# Patient Record
Sex: Male | Born: 1975 | Race: Black or African American | Hispanic: No | Marital: Married | Smoking: Never smoker
Health system: Southern US, Community
[De-identification: ages and names within clinical notes are randomized; demographics above are authoritative.]

## PROBLEM LIST (undated history)

## (undated) DIAGNOSIS — I10 Essential (primary) hypertension: Secondary | ICD-10-CM

## (undated) DIAGNOSIS — K859 Acute pancreatitis without necrosis or infection, unspecified: Secondary | ICD-10-CM

## (undated) DIAGNOSIS — E785 Hyperlipidemia, unspecified: Secondary | ICD-10-CM

## (undated) DIAGNOSIS — M1990 Unspecified osteoarthritis, unspecified site: Secondary | ICD-10-CM

## (undated) DIAGNOSIS — T7840XA Allergy, unspecified, initial encounter: Secondary | ICD-10-CM

## (undated) DIAGNOSIS — K219 Gastro-esophageal reflux disease without esophagitis: Secondary | ICD-10-CM

## (undated) DIAGNOSIS — G629 Polyneuropathy, unspecified: Secondary | ICD-10-CM

## (undated) DIAGNOSIS — F32A Depression, unspecified: Secondary | ICD-10-CM

## (undated) DIAGNOSIS — E1165 Type 2 diabetes mellitus with hyperglycemia: Principal | ICD-10-CM

## (undated) DIAGNOSIS — R030 Elevated blood-pressure reading, without diagnosis of hypertension: Secondary | ICD-10-CM

## (undated) DIAGNOSIS — F419 Anxiety disorder, unspecified: Secondary | ICD-10-CM

## (undated) DIAGNOSIS — G4730 Sleep apnea, unspecified: Secondary | ICD-10-CM

## (undated) HISTORY — DX: Type 2 diabetes mellitus with hyperglycemia: E11.65

## (undated) HISTORY — DX: Polyneuropathy, unspecified: G62.9

## (undated) HISTORY — DX: Allergy, unspecified, initial encounter: T78.40XA

## (undated) HISTORY — DX: Depression, unspecified: F32.A

## (undated) HISTORY — DX: Unspecified osteoarthritis, unspecified site: M19.90

## (undated) HISTORY — PX: WISDOM TOOTH EXTRACTION: SHX21

## (undated) HISTORY — DX: Anxiety disorder, unspecified: F41.9

## (undated) HISTORY — DX: Hyperlipidemia, unspecified: E78.5

## (undated) HISTORY — DX: Gastro-esophageal reflux disease without esophagitis: K21.9

## (undated) HISTORY — DX: Elevated blood-pressure reading, without diagnosis of hypertension: R03.0

---

## 2002-09-01 ENCOUNTER — Encounter: Payer: Self-pay | Admitting: Emergency Medicine

## 2002-09-01 ENCOUNTER — Emergency Department (HOSPITAL_COMMUNITY): Admission: EM | Admit: 2002-09-01 | Discharge: 2002-09-02 | Payer: Self-pay | Admitting: Emergency Medicine

## 2009-12-31 ENCOUNTER — Ambulatory Visit: Payer: Self-pay | Admitting: Family Medicine

## 2009-12-31 DIAGNOSIS — L910 Hypertrophic scar: Secondary | ICD-10-CM | POA: Insufficient documentation

## 2009-12-31 DIAGNOSIS — R030 Elevated blood-pressure reading, without diagnosis of hypertension: Secondary | ICD-10-CM

## 2009-12-31 DIAGNOSIS — L03221 Cellulitis of neck: Secondary | ICD-10-CM

## 2009-12-31 DIAGNOSIS — R631 Polydipsia: Secondary | ICD-10-CM

## 2009-12-31 DIAGNOSIS — L0211 Cutaneous abscess of neck: Secondary | ICD-10-CM

## 2009-12-31 HISTORY — DX: Elevated blood-pressure reading, without diagnosis of hypertension: R03.0

## 2010-01-02 ENCOUNTER — Ambulatory Visit: Payer: Self-pay | Admitting: Family Medicine

## 2010-01-02 ENCOUNTER — Encounter: Payer: Self-pay | Admitting: Family Medicine

## 2010-01-02 DIAGNOSIS — E1165 Type 2 diabetes mellitus with hyperglycemia: Secondary | ICD-10-CM

## 2010-01-02 DIAGNOSIS — IMO0001 Reserved for inherently not codable concepts without codable children: Secondary | ICD-10-CM

## 2010-01-02 HISTORY — DX: Reserved for inherently not codable concepts without codable children: IMO0001

## 2010-01-03 LAB — CONVERTED CEMR LAB
BUN: 14 mg/dL (ref 6–23)
Chloride: 100 meq/L (ref 96–112)
Creatinine,U: 156.4 mg/dL
HDL: 27.7 mg/dL — ABNORMAL LOW (ref >39.00)
Hgb A1c MFr Bld: 13 % — ABNORMAL HIGH (ref 4.6–6.5)
LDL Cholesterol: 149 mg/dL — ABNORMAL HIGH (ref 0–99)
Microalb Creat Ratio: 0.7 mg/g (ref 0.0–30.0)
Microalb, Ur: 1.1 mg/dL (ref 0.0–1.9)
Potassium: 4.9 meq/L (ref 3.5–5.1)
Triglycerides: 82 mg/dL (ref 0.0–149.0)
VLDL: 16.4 mg/dL (ref 0.0–40.0)

## 2010-02-04 ENCOUNTER — Ambulatory Visit
Admission: RE | Admit: 2010-02-04 | Discharge: 2010-02-04 | Payer: Self-pay | Source: Home / Self Care | Attending: Family Medicine | Admitting: Family Medicine

## 2010-02-21 NOTE — Assessment & Plan Note (Signed)
Summary: 2 day pt will come in fasting/njr   Vital Signs:  Patient profile:   35 year old male Temp:     98.6 degrees F oral BP sitting:   130 / 78  (left arm) Cuff size:   large  Vitals Entered By: Sid Falcon LPN (January 02, 2010 10:56 AM) CBG Result 278   History of Present Illness: Patient for followup. Abscess posterior neck. Feels much better overall after recent incision. Draining copious amounts of pus and much less tender. No fever or chills. No headaches.  Patient had nonfasting blood sugar 285 2 days ago. Very strong family history type 2 diabetes in father and several grandparents. Has had some polydipsia. No prior.diagnosis of diabetes.  Poor diet compliance and no regular exercise.  Currently on no meds.  Allergies (verified): No Known Drug Allergies  Past History:  Family History: Last updated: 01/02/2010 Family History Diabetes 1st degree relative  Father  Social History: Last updated: 12/31/2009 Never Smoked Alcohol use-no Regular exercise-no  Risk Factors: Exercise: no (12/31/2009)  Risk Factors: Smoking Status: never (12/31/2009)  Past Medical History: elevated blood pressure keloids Type 2 diabetes 12/11 PMH-FH-SH reviewed for relevance  Family History: Family History Diabetes 1st degree relative  Father  Review of Systems  The patient denies anorexia, fever, weight loss, chest pain, and headaches.    Physical Exam  General:  Well-developed,well-nourished,in no acute distress; alert,appropriate and cooperative throughout examination Head:  Normocephalic and atraumatic without obvious abnormalities. No apparent alopecia or balding. Mouth:  Oral mucosa and oropharynx without lesions or exudates.  Teeth in good repair. Neck:  large keloid mass posterior neck.  still has some purulent drainage which is expressed with some pressure. This is draining well. Much less tender to palpation also less erythema Lungs:  Normal respiratory effort,  chest expands symmetrically. Lungs are clear to auscultation, no crackles or wheezes. Heart:  normal rate and regular rhythm.   Extremities:  no pitting edema.   Impression & Recommendations:  Problem # 1:  ABSCESS, NECK (ICD-682.1) Assessment Improved cont antibiotic and warm compresses His updated medication list for this problem includes:    Amoxicillin-pot Clavulanate 875-125 Mg Tabs (Amoxicillin-pot clavulanate) ..... One by mouth two times a day with food for 10 days  Problem # 2:  DIABETES MELLITUS, TYPE II, UNCONTROLLED (ICD-250.02) Assessment: New  long talk with patient. Start metformin 500 mg b.i.d. Discussed dietary measures and exercise. Reassess in one month. Obtain baseline lipids and A1c.  This is new onset Type 2 DM.  Orders: Specimen Handling (16109) Venipuncture (60454) TLB-Lipid Panel (80061-LIPID) TLB-A1C / Hgb A1C (Glycohemoglobin) (83036-A1C) TLB-BMP (Basic Metabolic Panel-BMET) (80048-METABOL) TLB-Microalbumin/Creat Ratio, Urine (82043-MALB)  His updated medication list for this problem includes:    Metformin Hcl 500 Mg Tabs (Metformin hcl) ..... One by mouth two times a day  Complete Medication List: 1)  Amoxicillin-pot Clavulanate 875-125 Mg Tabs (Amoxicillin-pot clavulanate) .... One by mouth two times a day with food for 10 days 2)  Metformin Hcl 500 Mg Tabs (Metformin hcl) .... One by mouth two times a day 3)  Onetouch Ultra 2 W/device Kit (Blood glucose monitoring suppl) .... Disp kit today 12/14 4)  Onetouch Ultrasoft Lancets Misc (Lancets) .... Test bs 3-4 times per week 5)  Onetouch Ultra Blue Strp (Glucose blood) .... Test bo 3-4 times per week  Other Orders: Capillary Blood Glucose/CBG (09811)  Patient Instructions: 1)  Please schedule a follow-up appointment in 1 month.  2)  It is important that  you exercise reguarly at least 20 minutes 5 times a week. If you develop chest pain, have severe difficulty breathing, or feel very tired, stop  exercising immediately and seek medical attention.  3)  You need to lose weight. Consider a lower calorie diet and regular exercise.  Prescriptions: ONETOUCH ULTRA BLUE  STRP (GLUCOSE BLOOD) test BO 3-4 times per week  #100 x 3   Entered by:   Sid Falcon LPN   Authorized by:   Evelena Peat MD   Signed by:   Sid Falcon LPN on 16/10/9602   Method used:   Electronically to        [facility] #[id number]* (retail)       [address]       [place], [place]  [address]       Ph: [phone]       Fax: [phone]   RxID:   [id number] Biagio [employer] LANCETS  MISC (LANCETS) test BS 3-4 times per week  #100 x 3   Entered by:   Sid Falcon LPN   Authorized by:   Evelena Peat MD   Signed by:   Sid Falcon LPN on 53/66/4403   Method used:   Electronically to        [facility] #[id number]* (retail)       [address]       [place], [place]  [address]       Ph: [phone]       Fax: [phone]   RxID:   [id number] METFORMIN HCL 500 MG TABS (METFORMIN HCL) one by mouth two times a day  #60 x 3   Entered and Authorized by:   Evelena Peat MD   Signed by:   Evelena Peat MD on 01/02/2010   Method used:   Electronically to        [facility] #[id number]* (retail)       [address]       [place], [place]  [address]       Ph: [phone]       Fax: [phone]   RxID:   [id number]    Orders Added: 1)  Capillary Blood Glucose/CBG [82948] 2)  Specimen Handling [99000] 3)  Venipuncture [18299] 4)  TLB-Lipid Panel [80061-LIPID] 5)  TLB-A1C / Hgb A1C (Glycohemoglobin) [83036-A1C] 6)  TLB-BMP (Basic Metabolic Panel-BMET) [80048-METABOL] 7)  TLB-Microalbumin/Creat Ratio, Urine [82043-MALB] 8)  Est. Patient Level III [37169]

## 2010-02-21 NOTE — Assessment & Plan Note (Signed)
Summary: New Acute/fever/chills/runny nose/inf hair follical/cjr   Vital Signs:  Patient profile:   35 year old male Height:      72.75 inches Weight:      387 pounds Temp:     98.9 degrees F oral Pulse rate:   80 / minute Pulse rhythm:   regular Resp:     12 per minute BP sitting:   145 / 80  (left arm) Cuff size:   large  Vitals Entered By: Sid Falcon LPN (December 31, 2009 3:49 PM)  History of Present Illness: new patient to reestablish care. Not seen here over 3 years.  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 Has noticed some redness and soreness of the area for couple days. Also relates possible low-grade fever, chills and rhinorrhea past couple days.  Started spontaneously draining pus couple of days ago.  History elevated blood pressure never treated for hypertension. Reports whitecoat syndrome. No other chronic problems reported. Some increased thirst no polyuria. No history of diabetes.  Patient is nonsmoker. Occasional alcohol use.  Preventive Screening-Counseling & Management  Alcohol-Tobacco     Smoking Status: never  Caffeine-Diet-Exercise     Does Patient Exercise: no  Allergies (verified): No Known Drug Allergies  Past History:  Past Medical History: elevated blood pressure keloids PMH reviewed for relevance  Social History: Never Smoked Alcohol use-no Regular exercise-no Smoking Status:  never Does Patient Exercise:  no  Review of Systems  The patient denies anorexia, fever, weight loss, chest pain, syncope, dyspnea on exertion, peripheral edema, prolonged cough, headaches, hemoptysis, abdominal pain, melena, hematochezia, severe indigestion/heartburn, depression, and enlarged lymph nodes.    Physical Exam  General:  Well-developed,well-nourished,in no acute distress; alert,appropriate and cooperative throughout examination Head:  patient  has fairly extensive keloid formation posterior neck. Left lower neck region reveals some purulent drainage from one area. Minimal fluctuance. Moderate overlying erythema Ears:  External ear exam shows no significant lesions or deformities.  Otoscopic examination reveals clear canals, tympanic membranes are intact bilaterally without bulging, retraction, inflammation or discharge. Hearing is grossly normal bilaterally. Mouth:  Oral mucosa and oropharynx without lesions or exudates.  Teeth in good repair. Neck:  No deformities, masses, or tenderness noted. See skin exam. Lungs:  Normal respiratory effort, chest expands symmetrically. Lungs are clear to auscultation, no crackles or wheezes. Heart:  normal rate and regular rhythm.   Abdomen:  soft and non-tender.   Neurologic:  alert & oriented X3 and cranial nerves II-XII intact.   Skin:  large keloidal mass post neck.  Near Inf portion just R of midline he has an area about 3 by 4 cm of erythema and warmth and one small punctate area of purulent drainage.  minimal fluctuance. Cervical Nodes:  No lymphadenopathy noted   Impression & Recommendations:  Problem # 1:  ABSCESS, NECK (ICD-682.1) Assessment New Discussed risks and benefits of I and D and pt consented.  Prepped post neck with betadine and anest with 1 % plain xylociane. Using 11 blade 1 cm horizontal incsion made.  Probed abscessed area with hemostats and very little additional pus expressed. Start Augmentin and reassess in 2 days. Orders: T-Culture, Wound (87070/[id number]) I&D Abscess, Complex (10061)  His updated medication list for this problem includes:    Amoxicillin-pot Clavulanate 875-125 Mg Tabs (Amoxicillin-pot clavulanate) ..... One by mouth two times a day with food for 10 days  Problem # 2:  POLYDIPSIA (ICD-783.5)  Assessment: New  check blood sugar to rule out diabetes. Nonfasting (3 hr pp) 285.  Pt will return 2 days for fasting CBG and will very likely need to  start meds.  Orders: Glucose, (CBG) (81191)  Problem # 3:  ELEVATED BLOOD PRESSURE (ICD-796.2) Assessment: New reassess at follow up.  Problem # 4:  KELOID SCAR (ICD-701.4)  Complete Medication List: 1)  Amoxicillin-pot Clavulanate 875-125 Mg Tabs (Amoxicillin-pot clavulanate) .... One by mouth two times a day with food for 10 days  Patient Instructions: 1)  Schedule followup in 2 days to reassess 2)  warm compresses to neck several times daily 3)  Start antibiotic today Prescriptions: AMOXICILLIN-POT CLAVULANATE 875-125 MG TABS (AMOXICILLIN-POT CLAVULANATE) one by mouth two times a day with food for 10 days  #20 x 0   Entered and Authorized by:   Evelena Peat MD   Signed by:   Evelena Peat MD on 12/31/2009   Method used:   Electronically to        [facility] [phone]* (retail)       [address]       [place], [place]  [address]       Ph: [phone]       Fax: [phone]   RxID:   [id number]    Orders Added: 1)  T-Culture, Wound [87070/[id number]] 2)  I&D Abscess, Complex [10061] 3)  Glucose, (CBG) [82962] 4)  New Patient Level III [74259]

## 2010-02-21 NOTE — Assessment & Plan Note (Signed)
Summary: 1 month rov/njr   Vital Signs:  Patient profile:   35 year old male Temp:     98.1 degrees F oral BP sitting:   120 / 78  (left arm) Cuff size:   large  Vitals Entered By: Sid Falcon LPN (February 04, 2010 8:25 AM)  History of Present Illness: Type 2 follow up.   Great improvement in sugars.  Fasting sugar recently below  130.  Symptoms of hyperglycemia have resolved. Wt 380 to 368.  Overall feels better.  Starting exercise program. No side effects from metformin.  Is reducing carbs. Recent A1C 13%.  Allergies (verified): No Known Drug Allergies  Past History:  Past Medical History: Last updated: 01/02/2010 elevated blood pressure keloids Type 2 diabetes 12/11  Family History: Last updated: 01/02/2010 Family History Diabetes 1st degree relative  Father  Social History: Last updated: 12/31/2009 Never Smoked Alcohol use-no Regular exercise-no  Risk Factors: Exercise: no (12/31/2009)  Risk Factors: Smoking Status: never (12/31/2009)  Review of Systems  The patient denies anorexia, fever, chest pain, syncope, dyspnea on exertion, peripheral edema, and headaches.    Physical Exam  General:  Well-developed,well-nourished,in no acute distress; alert,appropriate and cooperative throughout examination Mouth:  Oral mucosa and oropharynx without lesions or exudates.  Teeth in good repair. Neck:  No deformities, masses, or tenderness noted. Lungs:  Normal respiratory effort, chest expands symmetrically. Lungs are clear to auscultation, no crackles or wheezes. Heart:  Normal rate and regular rhythm. S1 and S2 normal without gallop, murmur, click, rub or other extra sounds.   Impression & Recommendations:  Problem # 1:  DIABETES MELLITUS, TYPE II, UNCONTROLLED (ICD-250.02) Assessment Improved pt has good understanding of diet and has made great lifestyle changes.  Recheck 2 months and A1C then.  Glucose 197 here today but but had Glucerna about 1 hour  ago His updated medication list for this problem includes:    Metformin Hcl 500 Mg Tabs (Metformin hcl) ..... One by mouth two times a day  Orders: Glucose, (CBG) (16109)  Complete Medication List: 1)  Amoxicillin-pot Clavulanate 875-125 Mg Tabs (Amoxicillin-pot clavulanate) .... One by mouth two times a day with food for 10 days 2)  Metformin Hcl 500 Mg Tabs (Metformin hcl) .... One by mouth two times a day 3)  Onetouch Ultra 2 W/device Kit (Blood glucose monitoring suppl) .... Disp kit today 12/14 4)  Onetouch Ultrasoft Lancets Misc (Lancets) .... Test bs 3-4 times per week 5)  Onetouch Ultra Blue Strp (Glucose blood) .... Test bo 3-4 times per week 6)  Cephalexin 500 Mg Caps (Cephalexin) .... One tab three times a day  Patient Instructions: 1)  Please schedule a follow-up appointment in 2 months.  2)  It is important that you exercise reguarly at least 20 minutes 5 times a week. If you develop chest pain, have severe difficulty breathing, or feel very tired, stop exercising immediately and seek medical attention.  3)  You need to lose weight. Consider a lower calorie diet and regular exercise.    Orders Added: 1)  Glucose, (CBG) [82962] 2)  Est. Patient Level III [60454]

## 2010-04-01 ENCOUNTER — Encounter: Payer: Self-pay | Admitting: Family Medicine

## 2010-04-01 ENCOUNTER — Ambulatory Visit (INDEPENDENT_AMBULATORY_CARE_PROVIDER_SITE_OTHER): Payer: BC Managed Care – PPO | Admitting: Family Medicine

## 2010-04-01 DIAGNOSIS — E785 Hyperlipidemia, unspecified: Secondary | ICD-10-CM

## 2010-04-01 DIAGNOSIS — E1165 Type 2 diabetes mellitus with hyperglycemia: Secondary | ICD-10-CM

## 2010-04-01 LAB — LIPID PANEL
Cholesterol: 166 mg/dL (ref 0–200)
HDL: 26.2 mg/dL — ABNORMAL LOW (ref >39.00)
LDL Cholesterol: 122 mg/dL — ABNORMAL HIGH (ref 0–99)
Total CHOL/HDL Ratio: 6
Triglycerides: 90 mg/dL (ref 0.0–149.0)

## 2010-04-01 NOTE — Progress Notes (Signed)
  Subjective:    Patient ID: Gabriel Waters, male    DOB: 04/14/1975, 35 y.o.   MRN: [id number]  HPI  patient here for medical followup. Type 2 diabetes diagnosed within the past year. Patient has lost an estimated 40 pounds. Walks about 8-9 miles per day at his work. Metformin 500 mg twice daily. No symptoms of hyperglycemia. Fasting blood sugars 130-150 range.    hyperlipidemia. Low HDL and LDL 149. Needs repeat lipid after some weight loss. No history of peripheral vascular disease or CAD. Nonsmoker.  Acute issue of onset last week of upper respiratory symptoms. Nasal congestion and cough. Cough mostly dry. No associated fever. Symptoms slightly improved after Claritin-D. Denies sore throat.   Review of Systems  Constitutional: Positive for fatigue. Negative for fever and chills.  HENT: Positive for congestion, sore throat, rhinorrhea, postnasal drip and sinus pressure.   Respiratory: Positive for cough. Negative for shortness of breath and wheezing.   Cardiovascular: Negative for chest pain, palpitations and leg swelling.  Gastrointestinal: Negative for abdominal pain.  Genitourinary: Negative for dysuria.  Neurological: Negative for dizziness.       Objective:   Physical Exam  alert pleasant moderately obese gentleman in no distress Eardrums no acute change Oropharynx moist and clear Neck supple no adenopathy Chest clear to auscultation Heart regular rhythm and rate Extremities no edema       Assessment & Plan:   #1 type 2 diabetes. Improved. Recheck A1c. Titrate metformin if necessary #2 dyslipidemia. Repeat lipids  #3 acute bronchitis. Suspect viral origin. Reassurance given.

## 2010-04-02 NOTE — Progress Notes (Signed)
Quick Note:  Pt wife informed and med chg mae in Epic ______

## 2010-04-29 ENCOUNTER — Other Ambulatory Visit: Payer: Self-pay | Admitting: Family Medicine

## 2010-07-01 ENCOUNTER — Ambulatory Visit (INDEPENDENT_AMBULATORY_CARE_PROVIDER_SITE_OTHER): Payer: BC Managed Care – PPO | Admitting: Family Medicine

## 2010-07-01 ENCOUNTER — Encounter: Payer: Self-pay | Admitting: Family Medicine

## 2010-07-01 VITALS — BP 140/84 | Temp 99.0°F | Wt 346.0 lb

## 2010-07-01 DIAGNOSIS — E119 Type 2 diabetes mellitus without complications: Secondary | ICD-10-CM

## 2010-07-01 MED ORDER — METFORMIN HCL 500 MG PO TABS: 500.0000 mg | ORAL_TABLET | Freq: Two times a day (BID) | ORAL | Status: DC

## 2010-07-01 MED ORDER — ONETOUCH ULTRASOFT LANCETS MISC: Status: DC

## 2010-07-01 MED ORDER — GLUCOSE BLOOD VI STRP: ORAL_STRIP | Status: DC

## 2010-07-01 NOTE — Progress Notes (Signed)
  Subjective:    Patient ID: Gabriel Waters, male    DOB: 1975/05/24, 35 y.o.   MRN: [id number]  HPI Patient seen for followup type 2 diabetes and dyslipidemia. His weight loss has plateaued. Has lost 2 additional pounds since last visit. Still walking frequently. Blood sugars fasting around 130-140 range. Occasional postprandials run 200. Last A1c 7.5% which was greatly improved compared to previous of 13%. Lipids not quite to goal. No symptoms of hyperglycemia.   Review of Systems  Constitutional: Negative for appetite change and fatigue.  Respiratory: Negative for cough and shortness of breath.   Cardiovascular: Negative for chest pain, palpitations and leg swelling.       Objective:   Physical Exam  Constitutional: He appears well-developed and well-nourished.  HENT:  Right Ear: External ear normal.  Left Ear: External ear normal.  Neck: Neck supple.  Cardiovascular: Normal rate and regular rhythm.   Pulmonary/Chest: Effort normal and breath sounds normal. No respiratory distress. He has no wheezes. He has no rales.  Musculoskeletal: He exhibits no edema.          Assessment & Plan:  Type 2 diabetes which is improving. Continue weight loss efforts. Recheck A1c today. Routine followup in 3 months. Repeat lipids then. Discussed setting up with certified diabetes educator this point wishes to wait

## 2010-07-03 NOTE — Progress Notes (Signed)
Quick Note:  Pt informed on VM ______ 

## 2010-07-09 ENCOUNTER — Other Ambulatory Visit: Payer: Self-pay | Admitting: Family Medicine

## 2010-07-09 NOTE — Telephone Encounter (Signed)
?  short term - [facility]

## 2010-07-15 ENCOUNTER — Other Ambulatory Visit: Payer: Self-pay | Admitting: *Deleted

## 2010-07-15 MED ORDER — METFORMIN HCL 500 MG PO TABS: 500.0000 mg | ORAL_TABLET | Freq: Two times a day (BID) | ORAL | Status: DC

## 2010-07-15 MED ORDER — GLUCOSE BLOOD VI STRP: ORAL_STRIP | Status: DC

## 2010-07-15 MED ORDER — ONETOUCH ULTRASOFT LANCETS MISC: Status: DC

## 2010-07-15 NOTE — Telephone Encounter (Signed)
Rx filled for one year

## 2010-10-07 ENCOUNTER — Encounter: Payer: Self-pay | Admitting: Family Medicine

## 2010-10-07 ENCOUNTER — Ambulatory Visit (INDEPENDENT_AMBULATORY_CARE_PROVIDER_SITE_OTHER): Payer: BC Managed Care – PPO | Admitting: Family Medicine

## 2010-10-07 VITALS — BP 130/70 | Temp 98.7°F | Wt 350.0 lb

## 2010-10-07 DIAGNOSIS — Z23 Encounter for immunization: Secondary | ICD-10-CM

## 2010-10-07 LAB — HEMOGLOBIN A1C: Hgb A1c MFr Bld: 7.4 % — ABNORMAL HIGH (ref 4.6–6.5)

## 2010-10-07 LAB — HM DIABETES EYE EXAM: HM Diabetic Eye Exam: NORMAL

## 2010-10-07 NOTE — Progress Notes (Signed)
  Subjective:    Patient ID: Gabriel Waters, male    DOB: [birth year], 35 y.o.   MRN: [id number]  HPI Followup type 2 diabetes. Patient has not lost any further weight but waist size has gone down 2 more inches to 52 inches. Overall, he has lost about 70 pounds. He is walking regularly. Fasting blood sugars run 140 and postprandials around 200. No symptoms of hyperglycemia. Remains on metformin 2 in the morning and one at night.   Review of Systems  Constitutional: Negative for appetite change.  Eyes: Negative for visual disturbance.  Respiratory: Negative for cough and shortness of breath.   Cardiovascular: Negative for chest pain.  Neurological: Negative for dizziness and headaches.       Objective:   Physical Exam  Constitutional: He appears well-developed and well-nourished.  HENT:  Mouth/Throat: Oropharynx is clear and moist.  Eyes: Conjunctivae are normal. Pupils are equal, round, and reactive to light. Right eye exhibits no discharge. Left eye exhibits no discharge.  Cardiovascular: Normal rate, regular rhythm and normal heart sounds.   No murmur heard. Pulmonary/Chest: Effort normal and breath sounds normal. No respiratory distress. He has no wheezes. He has no rales.  Musculoskeletal: He exhibits no edema.          Assessment & Plan:  Type 2 diabetes. Improved control by most recent A1c. Recheck A1c today. If well-controlled six-month followup and if not 3 month followup. Influenza vaccine given. Continue weight loss efforts.

## 2010-10-09 NOTE — Progress Notes (Signed)
Quick Note:  Pt mother informed he will need return OV in 3-4 months, sig on meds corrected to new dose ______

## 2010-10-09 NOTE — Progress Notes (Signed)
Addended by: Melchor Amour on: 10/09/2010 05:22 PM   Modules accepted: Orders

## 2011-02-12 ENCOUNTER — Encounter: Payer: Self-pay | Admitting: Family Medicine

## 2011-02-12 ENCOUNTER — Ambulatory Visit (INDEPENDENT_AMBULATORY_CARE_PROVIDER_SITE_OTHER): Payer: BC Managed Care – PPO | Admitting: Family Medicine

## 2011-02-12 VITALS — BP 130/98 | Temp 98.8°F | Wt 350.0 lb

## 2011-02-12 LAB — HEMOGLOBIN A1C: Hgb A1c MFr Bld: 8.2 % — ABNORMAL HIGH (ref 4.6–6.5)

## 2011-02-12 NOTE — Progress Notes (Signed)
  Subjective:    Patient ID: Gabriel Waters, male    DOB: 1975-12-18, 36 y.o.   MRN: [id number]  HPI  Medical followup. Type 2 diabetes and history of mildly elevated blood pressure as well as hyperlipidemia. Poorly compliant with diet and exercise over the past few months. His weight is back up. Fasting blood sugars are 120-132. Recent A1c 7.4%. No symptoms of hyperglycemia. Patient compliant with metformin. He has been very reluctant to take additional medications. He is a nonsmoker. Has recently joined the [facility] and plans to start exercising regularly.  Review of Systems  Constitutional: Negative for fatigue.  Eyes: Negative for visual disturbance.  Respiratory: Negative for cough, chest tightness and shortness of breath.   Cardiovascular: Negative for chest pain, palpitations and leg swelling.  Genitourinary: Negative for dysuria.  Neurological: Negative for dizziness, syncope, weakness, light-headedness and headaches.       Objective:   Physical Exam  Constitutional: He appears well-developed and well-nourished.  HENT:  Right Ear: External ear normal.  Left Ear: External ear normal.  Mouth/Throat: Oropharynx is clear and moist.  Neck: Neck supple. No thyromegaly present.  Cardiovascular: Normal rate and regular rhythm.   Pulmonary/Chest: Effort normal and breath sounds normal. No respiratory distress. He has no wheezes. He has no rales.  Musculoskeletal: He exhibits no edema.  Lymphadenopathy:    He has no cervical adenopathy.          Assessment & Plan:  #1 type 2 diabetes. Recent A1c elevated. Repeat today. We discussed options including additional medication versus exercise and weight loss and he prefers the latter. We'll plan repeat A1c in 3 months #2 history of dyslipidemia. Stated our goal LDL less than 100. Repeat at followup in 3 months and consider statin at that point if not to goal

## 2011-02-14 NOTE — Progress Notes (Signed)
Quick Note:  Pt informed on home VM, pt has return OV in 3 months already scheduled ______

## 2011-04-03 ENCOUNTER — Encounter: Payer: Self-pay | Admitting: Family Medicine

## 2011-04-03 ENCOUNTER — Ambulatory Visit (INDEPENDENT_AMBULATORY_CARE_PROVIDER_SITE_OTHER): Payer: BC Managed Care – PPO | Admitting: Family Medicine

## 2011-04-03 VITALS — BP 140/80 | Temp 98.0°F | Wt 341.0 lb

## 2011-04-03 DIAGNOSIS — R51 Headache: Secondary | ICD-10-CM

## 2011-04-03 MED ORDER — AZITHROMYCIN 250 MG PO TABS: ORAL_TABLET | ORAL | Status: AC

## 2011-04-03 NOTE — Patient Instructions (Signed)
Be in touch next week if no better

## 2011-04-03 NOTE — Progress Notes (Signed)
  Subjective:    Patient ID: Gabriel Waters, male    DOB: 08/28/1975, 36 y.o.   MRN: [id number]  HPI  Patient seen with somewhat poorly localized left facial pain. He saw dentist recently and couple weeks ago had 2 crowns left lower posterior molars. When back because of some ongoing pain and was prescribed amoxicillin but only took for 2 days. He had diarrhea and stopped. Apparently no evidence for gum abscess. Also taking ibuprofen 600 mg 3 times a day without improvement. His pain is described as occasional throbbing with left ear pain and some left maxillary sinus pain. Mild nasal congestion. No purulent secretions. Symptoms are worse with hot food. Cold seems to help. Saw oral surgeon earlier today who did not feel this was a dental problem. He's not seen any facial swelling. No facial rash. Symptoms worse supine. No pain with chewing. No TMJ pain. No hearing changes. No facial weakness   Review of Systems  Constitutional: Negative for fever, chills, appetite change and unexpected weight change.  HENT: Positive for ear pain and sinus pressure. Negative for hearing loss, trouble swallowing and ear discharge.   Respiratory: Negative for cough.   Hematological: Negative for adenopathy.       Objective:   Physical Exam  Constitutional: He appears well-developed and well-nourished.  HENT:       Right eardrum is normal. Left is slightly retracted. No TMJ tenderness. Oropharynx is clear  Neck: Neck supple.  Cardiovascular: Normal rate and regular rhythm.   Pulmonary/Chest: Effort normal and breath sounds normal. No respiratory distress. He has no wheezes. He has no rales.  Lymphadenopathy:    He has no cervical adenopathy.  Skin: No rash noted.          Assessment & Plan:  Poorly localized left periauricular pain.  No evidence for TMJ. Question early left otitis media. Doubt trigeminal neuralgia. No evidence for dental abscess. Start Zithromax for 5 days. Followup next week if no  better

## 2011-05-14 ENCOUNTER — Ambulatory Visit: Payer: BC Managed Care – PPO | Admitting: Family Medicine

## 2011-05-26 ENCOUNTER — Encounter: Payer: Self-pay | Admitting: Family Medicine

## 2011-05-26 ENCOUNTER — Ambulatory Visit (INDEPENDENT_AMBULATORY_CARE_PROVIDER_SITE_OTHER): Payer: BC Managed Care – PPO | Admitting: Family Medicine

## 2011-05-26 VITALS — BP 122/70 | Temp 98.4°F | Wt 344.0 lb

## 2011-05-26 LAB — HEMOGLOBIN A1C: Hgb A1c MFr Bld: 7.6 % — ABNORMAL HIGH (ref 4.6–6.5)

## 2011-05-26 NOTE — Progress Notes (Signed)
  Subjective:    Patient ID: Gabriel Waters, male    DOB: 04/01/1975, 36 y.o.   MRN: [id number]  HPI  Patient seen for followup of diabetes. Exercising more since last visit. Lost about 6 pounds. Fasting blood sugars 70-82 hour postprandial at night 160-180. No symptoms of hyperglycemia. Overall feels better. Currently taking metformin 500 mg 2 twice daily.  Past Medical History  Diagnosis Date  . DIABETES MELLITUS, TYPE II, UNCONTROLLED 01/02/2010  . ELEVATED BLOOD PRESSURE 12/31/2009   No past surgical history on file.  reports that he has never smoked. He does not have any smokeless tobacco history on file. His alcohol and drug histories not on file. family history includes Diabetes in his father. No Known Allergies   Review of Systems  Constitutional: Negative for appetite change, fatigue and unexpected weight change.  Respiratory: Negative for cough and shortness of breath.   Cardiovascular: Negative for chest pain.  Neurological: Negative for dizziness.       Objective:   Physical Exam  Constitutional: He appears well-developed and well-nourished.  Cardiovascular: Normal rate and regular rhythm.   Pulmonary/Chest: Effort normal and breath sounds normal. No respiratory distress. He has no wheezes. He has no rales.  Musculoskeletal: He exhibits no edema.          Assessment & Plan:  Type 2 diabetes. History of recent suboptimal control but hopefully improving with weight loss. Recheck A1c today. Continue exercise weight loss efforts. Reminder for yearly eye exam. Routine followup 3 months

## 2011-05-27 NOTE — Progress Notes (Signed)
Quick Note:  Pt informed on home VM ______ 

## 2011-08-26 ENCOUNTER — Encounter: Payer: Self-pay | Admitting: Family Medicine

## 2011-08-26 ENCOUNTER — Ambulatory Visit (INDEPENDENT_AMBULATORY_CARE_PROVIDER_SITE_OTHER): Payer: BC Managed Care – PPO | Admitting: Family Medicine

## 2011-08-26 VITALS — BP 130/82 | Temp 98.7°F | Wt 341.0 lb

## 2011-08-26 DIAGNOSIS — E785 Hyperlipidemia, unspecified: Secondary | ICD-10-CM

## 2011-08-26 DIAGNOSIS — IMO0001 Reserved for inherently not codable concepts without codable children: Secondary | ICD-10-CM

## 2011-08-26 LAB — HEMOGLOBIN A1C: Hgb A1c MFr Bld: 7.8 % — ABNORMAL HIGH (ref 4.6–6.5)

## 2011-08-26 LAB — MICROALBUMIN / CREATININE URINE RATIO
Microalb Creat Ratio: 0.3 mg/g (ref 0.0–30.0)
Microalb, Ur: 1.3 mg/dL (ref 0.0–1.9)

## 2011-08-26 LAB — LIPID PANEL
HDL: 37.7 mg/dL — ABNORMAL LOW (ref >39.00)
Total CHOL/HDL Ratio: 5

## 2011-08-26 MED ORDER — METFORMIN HCL 500 MG PO TABS: ORAL_TABLET | ORAL | Status: DC

## 2011-08-26 NOTE — Progress Notes (Signed)
  Subjective:    Patient ID: Gabriel Waters, male    DOB: [birth year], 35 y.o.   MRN: [id number]  HPI  Medical followup. Patient has type 2 diabetes. Improving A1c. He continues to exercise. He's decreased his waist size from about 60 inches down to 50 inches. He is exercising about 5 days per week. Last A1c 7.6%. Patient takes metformin 1000 mg twice daily. He had previous dyslipidemia with low HDL and mildly elevated LDL. No lipid in over one year. Has never taken statin medication. Nonsmoker. No symptoms of hyperglycemia. No eye exam in over one year.  Past Medical History  Diagnosis Date  . DIABETES MELLITUS, TYPE II, UNCONTROLLED 01/02/2010  . ELEVATED BLOOD PRESSURE 12/31/2009   No past surgical history on file.  reports that he has never smoked. He does not have any smokeless tobacco history on file. His alcohol and drug histories not on file. family history includes Diabetes in his father. No Known Allergies    Review of Systems  Constitutional: Negative for fatigue.  Eyes: Negative for visual disturbance.  Respiratory: Negative for cough, chest tightness and shortness of breath.   Cardiovascular: Negative for chest pain, palpitations and leg swelling.  Neurological: Negative for dizziness, syncope, weakness, light-headedness and headaches.       Objective:   Physical Exam  Constitutional: He appears well-developed and well-nourished. No distress.  HENT:  Right Ear: External ear normal.  Left Ear: External ear normal.  Mouth/Throat: Oropharynx is clear and moist. No oropharyngeal exudate.  Neck: Neck supple. No thyromegaly present.  Cardiovascular: Normal rate and regular rhythm.   Pulmonary/Chest: Effort normal and breath sounds normal. No respiratory distress. He has no wheezes. He has no rales.  Musculoskeletal: He exhibits no edema.  Lymphadenopathy:    He has no cervical adenopathy.  Skin: No rash noted.          Assessment & Plan:  #1 type 2 diabetes.  Recheck A1c. Continue weight loss efforts. Check urine microalbumin screen. Schedule followup eye exam #2 hyperlipidemia. Repeat lipid panel.  Needs to lose more weight.

## 2011-08-26 NOTE — Patient Instructions (Signed)
Set up eye exam

## 2011-08-28 ENCOUNTER — Other Ambulatory Visit: Payer: Self-pay | Admitting: Family Medicine

## 2011-08-28 DIAGNOSIS — E785 Hyperlipidemia, unspecified: Secondary | ICD-10-CM

## 2011-08-29 ENCOUNTER — Other Ambulatory Visit: Payer: Self-pay | Admitting: *Deleted

## 2011-08-29 MED ORDER — PRAVASTATIN SODIUM 20 MG PO TABS: 20.0000 mg | ORAL_TABLET | Freq: Every day | ORAL | Status: DC

## 2011-10-20 ENCOUNTER — Other Ambulatory Visit: Payer: Self-pay | Admitting: Family Medicine

## 2011-10-31 ENCOUNTER — Other Ambulatory Visit (INDEPENDENT_AMBULATORY_CARE_PROVIDER_SITE_OTHER): Payer: BC Managed Care – PPO

## 2011-10-31 DIAGNOSIS — E785 Hyperlipidemia, unspecified: Secondary | ICD-10-CM

## 2011-10-31 LAB — LIPID PANEL
Cholesterol: 135 mg/dL (ref 0–200)
LDL Cholesterol: 86 mg/dL (ref 0–99)

## 2011-10-31 LAB — HEPATIC FUNCTION PANEL
ALT: 23 U/L (ref 0–53)
Alkaline Phosphatase: 64 U/L (ref 39–117)
Bilirubin, Direct: 0.3 mg/dL (ref 0.0–0.3)
Total Protein: 7.7 g/dL (ref 6.0–8.3)

## 2011-10-31 NOTE — Progress Notes (Signed)
Quick Note:  Pt mother informed ______ 

## 2012-12-03 ENCOUNTER — Ambulatory Visit (INDEPENDENT_AMBULATORY_CARE_PROVIDER_SITE_OTHER): Payer: BC Managed Care – PPO | Admitting: Family Medicine

## 2012-12-03 ENCOUNTER — Encounter: Payer: Self-pay | Admitting: Family Medicine

## 2012-12-03 ENCOUNTER — Telehealth: Payer: Self-pay | Admitting: Family Medicine

## 2012-12-03 VITALS — BP 130/76 | HR 105 | Temp 98.0°F | Wt 356.5 lb

## 2012-12-03 DIAGNOSIS — Z23 Encounter for immunization: Secondary | ICD-10-CM

## 2012-12-03 DIAGNOSIS — E785 Hyperlipidemia, unspecified: Secondary | ICD-10-CM

## 2012-12-03 DIAGNOSIS — N529 Male erectile dysfunction, unspecified: Secondary | ICD-10-CM

## 2012-12-03 DIAGNOSIS — IMO0001 Reserved for inherently not codable concepts without codable children: Secondary | ICD-10-CM

## 2012-12-03 LAB — HEPATIC FUNCTION PANEL
AST: 23 U/L (ref 0–37)
Albumin: 4.1 g/dL (ref 3.5–5.2)
Alkaline Phosphatase: 80 U/L (ref 39–117)
Bilirubin, Direct: 0.2 mg/dL (ref 0.0–0.3)

## 2012-12-03 LAB — LIPID PANEL
Cholesterol: 195 mg/dL (ref 0–200)
Total CHOL/HDL Ratio: 7

## 2012-12-03 LAB — MICROALBUMIN / CREATININE URINE RATIO: Microalb, Ur: 0.7 mg/dL (ref 0.0–1.9)

## 2012-12-03 LAB — BASIC METABOLIC PANEL
BUN: 14 mg/dL (ref 6–23)
Chloride: 98 mEq/L (ref 96–112)
Creatinine, Ser: 0.9 mg/dL (ref 0.4–1.5)

## 2012-12-03 LAB — HEMOGLOBIN A1C: Hgb A1c MFr Bld: 13.2 % — ABNORMAL HIGH (ref 4.6–6.5)

## 2012-12-03 MED ORDER — TADALAFIL 20 MG PO TABS: ORAL_TABLET | ORAL | Status: DC

## 2012-12-03 NOTE — Telephone Encounter (Signed)
Patient Information:  Caller Name: Gabriel Waters  Phone: [phone]  Patient: Gabriel Waters  Gender: Male  DOB: 02-Aug-1975  Age: 37 Years  PCP: Evelena Peat [facility])  Office Follow Up:  Does the office need to follow up with this patient?: No  Instructions For The Office: N/A  RN Note:  Spoke with Gabriel Waters in office - Assigned 1:45 pm Appt with Dr Caryl Never.  Symptoms  Reason For Call & Symptoms: On Metformin about 3 years, taking 1000mg  BID.  Noting blood sugars up more frequently in 200s during the last 2 weeks.  Last time checking blood sugar we 11/12 and reading 200.  Started Herbal LIfe Shake for breakfast today 11/14 about 8:15 am and plans one for lunch, perhaps meal at supper - has not discussed this new diet with MD yet.  Did not do BS this am, current BS at 9:04 am is 330.  Does not have ways to check urine ketones.  Reviewed Health History In EMR: Yes  Reviewed Medications In EMR: Yes  Reviewed Allergies In EMR: Yes  Reviewed Surgeries / Procedures: Yes  Date of Onset of Symptoms: 11/19/2012  Treatments Tried: Herbal Life Shake  Treatments Tried Worked: No  Guideline(s) Used:  Diabetes - High Blood Sugar  Disposition Per Guideline:   See Today in Office  Reason For Disposition Reached:   Patient wants to be seen  Advice Given:  Treatment - Liquids  Generally, you should try to drink 6-8 glasses of water each day.  Call Back If:  You become worse.  Patient Will Follow Care Advice:  YES  Appointment Scheduled:  12/03/2012 13:45:00 Appointment Scheduled Provider:  Evelena Peat [facility])

## 2012-12-03 NOTE — Patient Instructions (Addendum)
Liraglutide injection What is this medicine? LIRAGLUTIDE (LIR a GLOO tide) is used to improve blood sugar control in adults with type 2 diabetes. This medicine may be used with other oral diabetes medicines. This medicine may be used for other purposes; ask your health care provider or pharmacist if you have questions. COMMON BRAND NAME(S): Victoza What should I tell my health care provider before I take this medicine? They need to know if you have any of these conditions: -endocrine tumors (MEN 2) or if someone in your family had these tumors -gallstones -high cholesterol -history of alcohol abuse problem -history of pancreatitis -kidney disease or if you are on dialysis -liver disease -previous swelling of the tongue, face, or lips with difficulty breathing, difficulty swallowing, hoarseness, or tightening of the throat -stomach problems -thyroid cancer or if someone in your family had thyroid cancer -an unusual or allergic reaction to liraglutide, medicines, foods, dyes, or preservatives -pregnant or trying to get pregnant -breast-feeding How should I use this medicine? This medicine is for injection under the skin of your upper leg, stomach area, or upper arm. You will be taught how to prepare and give this medicine. Use exactly as directed. Take your medicine at regular intervals. Do not take it more often than directed. It is important that you put your used needles and syringes in a special sharps container. Do not put them in a trash can. If you do not have a sharps container, call your pharmacist or healthcare provider to get one. A special MedGuide will be given to you by the pharmacist with each prescription and refill. Be sure to read this information carefully each time. Talk to your pediatrician regarding the use of this medicine in children. Special care may be needed. Overdosage: If you think you've taken too much of this medicine contact a poison control center or emergency  room at once. Overdosage: If you think you have taken too much of this medicine contact a poison control center or emergency room at once. NOTE: This medicine is only for you. Do not share this medicine with others. What if I miss a dose? If you miss a dose, take it as soon as you can. If it is almost time for your next dose, take only that dose. Do not take double or extra doses. What may interact with this medicine? -acetaminophen -atorvastatin -birth control pills -digoxin -griseofulvin -lisinoprilMany medications may cause changes in blood sugar, these include: -alcohol containing beverages -aspirin and aspirin-like drugs -chloramphenicol -chromium -diuretics -male hormones, such as estrogens or progestins, birth control pills -heart medicines -isoniazid -male hormones or anabolic steroids -medications for weight loss -medicines for allergies, asthma, cold, or cough -medicines for mental problems -medicines called MAO inhibitors - Nardil, Parnate, Marplan, Eldepryl -niacin -NSAIDS, such as ibuprofen -pentamidine -phenytoin -probenecid -quinolone antibiotics such as ciprofloxacin, levofloxacin, ofloxacin -some herbal dietary supplements -steroid medicines such as prednisone or cortisone -thyroid hormonesSome medications can hide the warning symptoms of low blood sugar (hypoglycemia). You may need to monitor your blood sugar more closely if you are taking one of these medications. These include: -beta-blockers, often used for high blood pressure or heart problems (examples include atenolol, metoprolol, propranolol) -clonidine -guanethidine -reserpine This list may not describe all possible interactions. Give your health care provider a list of all the medicines, herbs, non-prescription drugs, or dietary supplements you use. Also tell them if you smoke, drink alcohol, or use illegal drugs. Some items may interact with your medicine. What should I watch for  while using this  medicine? Visit your doctor or health care professional for regular checks on your progress. A test called the HbA1C (A1C) will be monitored. This is a simple blood test. It measures your blood sugar control over the last 2 to 3 months. You will receive this test every 3 to 6 months. Learn how to check your blood sugar. Learn the symptoms of low and high blood sugar and how to manage them. Always carry a quick-source of sugar with you in case you have symptoms of low blood sugar. Examples include hard sugar candy or glucose tablets. Make sure others know that you can choke if you eat or drink when you develop serious symptoms of low blood sugar, such as seizures or unconsciousness. They must get medical help at once. Tell your doctor or health care professional if you have high blood sugar. You might need to change the dose of your medicine. If you are sick or exercising more than usual, you might need to change the dose of your medicine. Do not skip meals. Ask your doctor or health care professional if you should avoid alcohol. Many nonprescription cough and cold products contain sugar or alcohol. These can affect blood sugar. Wear a medical ID bracelet or chain, and carry a card that describes your disease and details of your medicine and dosage times. What side effects may I notice from receiving this medicine? Side effects that you should report to your doctor or health care professional as soon as possible: -allergic reactions like skin rash, itching or hives, swelling of the face, lips, or tongue -breathing problems -fever, chills -loss of appetite -signs and symptoms of low blood sugar such as feeling anxious, confusion, dizziness, increased hunger, unusually weak or tired, sweating, shakiness, cold, irritable, headache, blurred vision, fast heartbeat, loss of consciousness -trouble passing urine or change in the amount of urine -unusual stomach pain or upset -vomiting  Side effects that  usually do not require medical attention (Report these to your doctor or health care professional if they continue or are bothersome.): -diarrhea -headache -nausea This list may not describe all possible side effects. Call your doctor for medical advice about side effects. You may report side effects to FDA at 1-800-FDA-1088. Where should I keep my medicine? Keep out of the reach of children. Store unopened pen in a refrigerator between 2 and 8 degrees C (36 and 46 degrees F). Do not freeze or use if the medicine has been frozen. Protect from light and excessive heat. After you first use the pen, it can be stored at room temperature between 15 and 30 degrees C (59 and 86 degrees F) or in a refrigerator. Throw away your used pen after 30 days or after the expiration date, whichever comes first. Do not store your pen with the needle attached. If the needle is left on, medicine may leak from the pen. NOTE: This sheet is a summary. It may not cover all possible information. If you have questions about this medicine, talk to your doctor, pharmacist, or health care provider.  2014, Elsevier/Gold Standard. (2012-04-21 12:24:45)  Victoza 0.6 mg once daily for one week, then 1.2 mg once daily until follow up.

## 2012-12-03 NOTE — Progress Notes (Signed)
Pre visit review using our clinic review tool, if applicable. No additional management support is needed unless otherwise documented below in the visit note. 

## 2012-12-03 NOTE — Progress Notes (Signed)
  Subjective:    Patient ID: Gabriel Waters, male    DOB: 06/13/1975, 37 y.o.   MRN: [id number]  HPI Patient seen for followup type 2 diabetes. History of poor compliance we have not seen him in over one year. He recently checked blood sugar this morning 330. He surprisingly does not have a lot of urine frequency or thirst. Weight has been stable. Currently takes metformin 1000 mg twice daily. We have prescribed Lipitor previously but he is not taking this. He has had some weight gain recently No consistent exercise. Poor dietary compliance at times. No recent infectious symptoms. No blurred vision.  Patient complains of some recent progressive issues with erectile dysfunction. Never treated previously. Fairly good libido  Past Medical History  Diagnosis Date  . DIABETES MELLITUS, TYPE II, UNCONTROLLED 01/02/2010  . ELEVATED BLOOD PRESSURE 12/31/2009   No past surgical history on file.  reports that he has never smoked. He does not have any smokeless tobacco history on file. His alcohol and drug histories are not on file. family history includes Diabetes in his father. No Known Allergies    Review of Systems  Constitutional: Positive for fatigue. Negative for unexpected weight change.  Eyes: Negative for visual disturbance.  Respiratory: Negative for cough, chest tightness and shortness of breath.   Cardiovascular: Negative for chest pain, palpitations and leg swelling.  Endocrine: Negative for polydipsia and polyuria.  Neurological: Negative for dizziness, syncope, weakness, light-headedness and headaches.       Objective:   Physical Exam  Constitutional: He appears well-developed and well-nourished.  Neck: Neck supple. No thyromegaly present.  Cardiovascular: Normal rate and regular rhythm.   Pulmonary/Chest: Effort normal and breath sounds normal. No respiratory distress. He has no wheezes. He has no rales.  Musculoskeletal: He exhibits no edema.          Assessment &  Plan:  Type 2 diabetes. History of poor compliance. Very poorly controlled. Continue metformin. Add Victoza 0.6 mg subcutaneous once daily for one week then titrate to 1.2 mg once daily until followup in 3-4 weeks. Obtain repeat labs with hemoglobin A1c, urine microalbumin, lipid panel, basic metabolic panel. Patient does not have any contraindications such as liver dysfunction or history of pancreatitis.  Erectile dysfunction. Trial of Cialis 20 mg every other day as needed

## 2012-12-06 LAB — LDL CHOLESTEROL, DIRECT: Direct LDL: 141.4 mg/dL

## 2013-01-04 ENCOUNTER — Encounter: Payer: Self-pay | Admitting: Family Medicine

## 2013-01-04 ENCOUNTER — Ambulatory Visit (INDEPENDENT_AMBULATORY_CARE_PROVIDER_SITE_OTHER): Payer: BC Managed Care – PPO | Admitting: Family Medicine

## 2013-01-04 VITALS — BP 132/88 | HR 90 | Temp 98.8°F | Wt 354.8 lb

## 2013-01-04 DIAGNOSIS — E785 Hyperlipidemia, unspecified: Secondary | ICD-10-CM

## 2013-01-04 DIAGNOSIS — N529 Male erectile dysfunction, unspecified: Secondary | ICD-10-CM

## 2013-01-04 DIAGNOSIS — IMO0001 Reserved for inherently not codable concepts without codable children: Secondary | ICD-10-CM

## 2013-01-04 MED ORDER — SILDENAFIL CITRATE 100 MG PO TABS: 50.0000 mg | ORAL_TABLET | Freq: Every day | ORAL | Status: DC | PRN

## 2013-01-04 NOTE — Progress Notes (Signed)
Pre visit review using our clinic review tool, if applicable. No additional management support is needed unless otherwise documented below in the visit note. 

## 2013-01-04 NOTE — Patient Instructions (Signed)
Continue with exercise and weight loss efforts.

## 2013-01-04 NOTE — Progress Notes (Signed)
   Subjective:    Patient ID: Gabriel Waters, male    DOB: 01-19-1976, 37 y.o.   MRN: [id number]  HPI Patient seen for medical follow in 3 months up. Refer to previous note. He had poor compliance with followup in recent A1c 13.2%. He was already taking metformin 1000 mg twice daily. We added Victoza currently 1.2 mg daily and he is tolerating without side effects.  He's lost about 2 pounds. He plans to start more consistent exercise soon. Prior to starting this medication his fasting blood sugars were upper 200s and low 300s. Now fasting blood sugars ranging between 120 and 190. Overall feels much improved. Less blurred vision.  Past Medical History  Diagnosis Date  . DIABETES MELLITUS, TYPE II, UNCONTROLLED 01/02/2010  . ELEVATED BLOOD PRESSURE 12/31/2009   No past surgical history on file.  reports that he has never smoked. He does not have any smokeless tobacco history on file. His alcohol and drug histories are not on file. family history includes Diabetes in his father. Allergies  Allergen Reactions  . Lipitor [Atorvastatin] Other (See Comments)    Arthralgias.      Review of Systems  Constitutional: Negative for fatigue and unexpected weight change.  Eyes: Negative for visual disturbance.  Respiratory: Negative for cough, chest tightness and shortness of breath.   Cardiovascular: Negative for chest pain, palpitations and leg swelling.  Genitourinary: Negative for dysuria.  Neurological: Negative for dizziness, syncope, weakness, light-headedness and headaches.       Objective:   Physical Exam  Constitutional: He appears well-developed and well-nourished.  Neck: Neck supple. No thyromegaly present.  Cardiovascular: Normal rate.   Pulmonary/Chest: Effort normal and breath sounds normal. No respiratory distress. He has no wheezes. He has no rales.  Musculoskeletal: He exhibits no edema.  Lymphadenopathy:    He has no cervical adenopathy.          Assessment &  Plan:  #1 type 2 diabetes. History of recent poor control. Poor compliance. Improved with addition of medication above. We discussed other options such as addition of SGLP 2 inhibitor at this point he prefers to work on weight loss and recheck A1c in 2 months. #2 dyslipidemia. He is above goal with recent LDL 141. Previous intolerance to Lipitor. We've recommended consideration for different statin at this point he is reluctant. He does agree to repeat lipids in 2 months if not improving toward goal then consider trial of either Crestor or pravastatin. #3 erectile dysfunction. Patient requesting followed by. No contraindications. Prescription written

## 2013-01-24 ENCOUNTER — Other Ambulatory Visit: Payer: Self-pay | Admitting: Family Medicine

## 2013-01-26 ENCOUNTER — Telehealth: Payer: Self-pay | Admitting: Family Medicine

## 2013-01-26 MED ORDER — LIRAGLUTIDE 18 MG/3ML ~~LOC~~ SOPN: PEN_INJECTOR | SUBCUTANEOUS | Status: DC

## 2013-01-26 NOTE — Telephone Encounter (Signed)
RX sent to pharmacy  

## 2013-01-26 NOTE — Telephone Encounter (Signed)
Pt states you he was to pu rx Liraglutide (VICTOZA) 18 MG/3ML from [facility] but they do not have anything. pls advise

## 2013-02-01 ENCOUNTER — Telehealth: Payer: Self-pay | Admitting: Family Medicine

## 2013-02-01 NOTE — Telephone Encounter (Signed)
Proceed with PA for Victoza.

## 2013-02-01 NOTE — Telephone Encounter (Signed)
I spoke to customer service at [facility] about the PA request for Victoza.  I was advised that Bydureon Pen 4-pak- 2 mg is available under the pt's plan with NO PA required.  Would you like to change the RX or have me proceed with the PA for Victoza??

## 2013-02-03 NOTE — Telephone Encounter (Signed)
PA submitted.

## 2013-03-09 ENCOUNTER — Encounter: Payer: Self-pay | Admitting: Family Medicine

## 2013-03-09 ENCOUNTER — Ambulatory Visit (INDEPENDENT_AMBULATORY_CARE_PROVIDER_SITE_OTHER): Payer: BC Managed Care – PPO | Admitting: Family Medicine

## 2013-03-09 ENCOUNTER — Ambulatory Visit: Payer: BC Managed Care – PPO | Admitting: Family Medicine

## 2013-03-09 VITALS — BP 130/78 | HR 90 | Wt 337.0 lb

## 2013-03-09 DIAGNOSIS — N529 Male erectile dysfunction, unspecified: Secondary | ICD-10-CM

## 2013-03-09 DIAGNOSIS — R400 Somnolence: Secondary | ICD-10-CM

## 2013-03-09 DIAGNOSIS — E1165 Type 2 diabetes mellitus with hyperglycemia: Principal | ICD-10-CM

## 2013-03-09 DIAGNOSIS — IMO0001 Reserved for inherently not codable concepts without codable children: Secondary | ICD-10-CM

## 2013-03-09 DIAGNOSIS — R6882 Decreased libido: Secondary | ICD-10-CM

## 2013-03-09 DIAGNOSIS — E785 Hyperlipidemia, unspecified: Secondary | ICD-10-CM

## 2013-03-09 DIAGNOSIS — G4710 Hypersomnia, unspecified: Secondary | ICD-10-CM

## 2013-03-09 MED ORDER — EXENATIDE ER 2 MG ~~LOC~~ PEN
2.0000 mg | PEN_INJECTOR | SUBCUTANEOUS | Status: DC
Start: 2013-03-09 — End: 2013-05-17

## 2013-03-09 NOTE — Progress Notes (Signed)
   Subjective:    Patient ID: Gabriel Waters, male    DOB: 04/28/1975, 38 y.o.   MRN: [id number]  HPI Patient seen regarding type 2 diabetes. History of poor control with hemoglobin A1c 13.2% back in November. He has history of some poor compliance but is now back on metformin and recent addition of Victoza.  He has lost some weight and is making dietary changes and blood sugars are much improved. He's had several recent fasting blood sugars around 90. No symptoms of hyperglycemia. He has lost about 17 pounds since last visit.  He is concerned about possible sleep apnea. He has low libido, increased fatigue and frequent daytime somnolence. His girlfriend has frequently noted that he snores and has had several seconds of apnea observed. He would like to consider further evaluation with sleep study  Past Medical History  Diagnosis Date  . DIABETES MELLITUS, TYPE II, UNCONTROLLED 01/02/2010  . ELEVATED BLOOD PRESSURE 12/31/2009   No past surgical history on file.  reports that he has never smoked. He does not have any smokeless tobacco history on file. His alcohol and drug histories are not on file. family history includes Diabetes in his father. Allergies  Allergen Reactions  . Lipitor [Atorvastatin] Other (See Comments)    Arthralgias.      Review of Systems  Constitutional: Positive for fatigue.  Eyes: Negative for visual disturbance.  Respiratory: Negative for cough, chest tightness and shortness of breath.   Cardiovascular: Negative for chest pain, palpitations and leg swelling.  Endocrine: Negative for polydipsia and polyuria.  Neurological: Negative for dizziness, syncope, weakness, light-headedness and headaches.       Objective:   Physical Exam  Constitutional: He is oriented to person, place, and time. He appears well-developed and well-nourished.  HENT:  Right Ear: External ear normal.  Left Ear: External ear normal.  Mouth/Throat: Oropharynx is clear and moist.  Eyes:  Pupils are equal, round, and reactive to light.  Neck: Neck supple. No thyromegaly present.  Cardiovascular: Normal rate and regular rhythm.   Pulmonary/Chest: Effort normal and breath sounds normal. No respiratory distress. He has no wheezes. He has no rales.  Musculoskeletal: He exhibits no edema.  Neurological: He is alert and oriented to person, place, and time.          Assessment & Plan:  #1 type 2 diabetes with recent poor control. He is so improvement with recent weight loss efforts and additional medication as above. Because of insurance issues we are having to switch to Bydureon . Repeat A1c. Recent urine microalbumin stable #2 morbid obesity. He is congratulated with doing a great job with weight loss over the past couple months and no doubt his new diabetes medication has helped with this. Continue weight loss efforts #3 probable obstructive sleep apnea. Schedule sleep evaluation.  He has Epworth Sleepiness score of 21 #4 dyslipidemia. Recheck lipid and hepatic panel. Patient previously did not tolerate statin and had requested repeat lipids. Consider pravastatin or Crestor if LDL still elevated

## 2013-03-09 NOTE — Progress Notes (Signed)
Pre visit review using our clinic review tool, if applicable. No additional management support is needed unless otherwise documented below in the visit note. 

## 2013-03-15 ENCOUNTER — Other Ambulatory Visit (INDEPENDENT_AMBULATORY_CARE_PROVIDER_SITE_OTHER): Payer: BC Managed Care – PPO

## 2013-03-15 DIAGNOSIS — R6882 Decreased libido: Secondary | ICD-10-CM

## 2013-03-15 DIAGNOSIS — N529 Male erectile dysfunction, unspecified: Secondary | ICD-10-CM

## 2013-03-15 LAB — TESTOSTERONE: TESTOSTERONE: 246.03 ng/dL — AB (ref 350.00–890.00)

## 2013-03-22 ENCOUNTER — Other Ambulatory Visit: Payer: Self-pay | Admitting: Family Medicine

## 2013-03-22 ENCOUNTER — Telehealth: Payer: Self-pay | Admitting: Family Medicine

## 2013-03-22 DIAGNOSIS — E1165 Type 2 diabetes mellitus with hyperglycemia: Secondary | ICD-10-CM

## 2013-03-22 DIAGNOSIS — IMO0001 Reserved for inherently not codable concepts without codable children: Secondary | ICD-10-CM

## 2013-03-22 DIAGNOSIS — E785 Hyperlipidemia, unspecified: Secondary | ICD-10-CM

## 2013-03-22 DIAGNOSIS — N529 Male erectile dysfunction, unspecified: Secondary | ICD-10-CM

## 2013-03-22 NOTE — Telephone Encounter (Signed)
Pt informed and orders are placed.

## 2013-03-22 NOTE — Telephone Encounter (Signed)
Pt is calling requesting results from his labs on 03/09/13.

## 2013-04-04 ENCOUNTER — Other Ambulatory Visit (INDEPENDENT_AMBULATORY_CARE_PROVIDER_SITE_OTHER): Payer: BC Managed Care – PPO

## 2013-04-04 DIAGNOSIS — N529 Male erectile dysfunction, unspecified: Secondary | ICD-10-CM

## 2013-04-04 DIAGNOSIS — E785 Hyperlipidemia, unspecified: Secondary | ICD-10-CM

## 2013-04-04 DIAGNOSIS — E1165 Type 2 diabetes mellitus with hyperglycemia: Secondary | ICD-10-CM

## 2013-04-04 DIAGNOSIS — IMO0001 Reserved for inherently not codable concepts without codable children: Secondary | ICD-10-CM

## 2013-04-04 LAB — HEPATIC FUNCTION PANEL
ALT: 28 U/L (ref 0–53)
AST: 17 U/L (ref 0–37)
Albumin: 3.9 g/dL (ref 3.5–5.2)
Alkaline Phosphatase: 72 U/L (ref 39–117)
BILIRUBIN DIRECT: 0.1 mg/dL (ref 0.0–0.3)
BILIRUBIN TOTAL: 1.4 mg/dL — AB (ref 0.3–1.2)
Total Protein: 7.3 g/dL (ref 6.0–8.3)

## 2013-04-04 LAB — LIPID PANEL
CHOLESTEROL: 161 mg/dL (ref 0–200)
HDL: 30.2 mg/dL — ABNORMAL LOW (ref >39.00)
LDL CALC: 106 mg/dL — AB (ref 0–99)
Total CHOL/HDL Ratio: 5
Triglycerides: 123 mg/dL (ref 0.0–149.0)
VLDL: 24.6 mg/dL (ref 0.0–40.0)

## 2013-04-04 LAB — HEMOGLOBIN A1C: HEMOGLOBIN A1C: 8.4 % — AB (ref 4.6–6.5)

## 2013-04-04 LAB — TESTOSTERONE: TESTOSTERONE: 192.78 ng/dL — AB (ref 350.00–890.00)

## 2013-04-05 ENCOUNTER — Encounter: Payer: Self-pay | Admitting: Pulmonary Disease

## 2013-04-05 ENCOUNTER — Ambulatory Visit (INDEPENDENT_AMBULATORY_CARE_PROVIDER_SITE_OTHER): Payer: BC Managed Care – PPO | Admitting: Pulmonary Disease

## 2013-04-05 VITALS — BP 150/98 | HR 98 | Temp 98.6°F | Ht 74.0 in | Wt 344.8 lb

## 2013-04-05 DIAGNOSIS — G4733 Obstructive sleep apnea (adult) (pediatric): Secondary | ICD-10-CM

## 2013-04-05 NOTE — Progress Notes (Signed)
Subjective:    Patient ID: Gabriel Waters, male    DOB: 06/11/1975, 38 y.o.   MRN: [id number]  HPI The patient is a 38 year old male who I've been asked to see for possible obstructive sleep apnea. He has been noted to have loud snoring, as well as an abnormal breathing pattern during sleep. He has frequent awakenings at night, and is not rested in the mornings upon arising. He notes definite sleep pressure during the day with any period of inactivity, and can fall asleep easily in the evenings watching television or movies. He also has some sleep pressure driving longer distances. The patient states that his weight is down almost 100 pounds over the last 2 years, and his Epworth score today is abnormal at 17.   Sleep Questionnaire What time do you typically go to bed?( Between what hours) 10p-12a 10p-12a at 1134 on 04/05/13 by Maisie FusAshtyn M Green, CMA How long does it take you to fall asleep? within a few mins within a few mins at 1134 on 04/05/13 by Maisie FusAshtyn M Green, CMA How many times during the night do you wake up? 5 5 at 1134 on 04/05/13 by Maisie FusAshtyn M Green, CMA What time do you get out of bed to start your day? 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 (In pounds) 100 lb (45.36 kg)100 lb (45.36 kg) decreased at 1134 on 04/05/13 by Maisie FusAshtyn M Green, CMA Have you ever had a sleep study before? No No at 1134 on 04/05/13 by Maisie FusAshtyn M Green, CMA Do you currently use CPAP? No No at 1134 on 04/05/13 by Maisie FusAshtyn M Green, CMA Do you wear oxygen at any time? No    Review of Systems  Constitutional: Negative for fever and unexpected weight change.  HENT: Positive for congestion and sinus pressure. Negative for dental problem, ear pain, nosebleeds, postnasal drip, rhinorrhea, sneezing,  sore throat and trouble swallowing.   Eyes: Negative for redness and itching.  Respiratory: Positive for cough (" tickle"  x 2-3 days ago). Negative for chest tightness, shortness of breath and wheezing.   Cardiovascular: Negative for palpitations and leg swelling.  Gastrointestinal: Negative for nausea and vomiting.  Genitourinary: Negative for dysuria.  Musculoskeletal: Negative for joint swelling.  Skin: Negative for rash.  Neurological: Positive for headaches.  Hematological: Does not bruise/bleed easily.  Psychiatric/Behavioral: Negative for dysphoric mood. The patient is not nervous/anxious.        Objective:   Physical Exam Constitutional:  Morbidly obese male, no acute distress  HENT:  Nares patent without discharge  Oropharynx without exudate, palate and uvula are moderately elongated.   Eyes:  Perrla, eomi, no scleral icterus  Neck:  No JVD, no TMG  Cardiovascular:  Normal rate, regular rhythm, no rubs or gallops.  No murmurs        Intact distal pulses but decreased.   Pulmonary :  Normal breath sounds, no stridor or respiratory distress   No rales, rhonchi, or wheezing  Abdominal:  Soft, nondistended, bowel sounds present.  No tenderness noted.   Musculoskeletal:  minimal lower extremity edema noted.  Lymph Nodes:  No cervical lymphadenopathy noted  Skin:  No cyanosis noted  Neurologic:  Appears mildly sleepy but awake, moves all 4 extremities without obvious deficit.  Assessment & Plan:

## 2013-04-05 NOTE — Patient Instructions (Signed)
Will arrange for home sleep testing.  Will call once results are available. Continue working on weight loss

## 2013-04-05 NOTE — Assessment & Plan Note (Signed)
The patient's history is very suggestive of clinically significant sleep apnea. I have had a long discussion with him about sleep apnea, including its impact to his quality of life and cardiovascular health. At this point, I think he needs to have a sleep study, and he is an excellent candidate for home sleep testing. The patient is agreeable to this approach.

## 2013-05-09 DIAGNOSIS — G4733 Obstructive sleep apnea (adult) (pediatric): Secondary | ICD-10-CM

## 2013-05-16 ENCOUNTER — Encounter: Payer: Self-pay | Admitting: Pulmonary Disease

## 2013-05-16 ENCOUNTER — Telehealth: Payer: Self-pay | Admitting: Pulmonary Disease

## 2013-05-16 DIAGNOSIS — G4730 Sleep apnea, unspecified: Secondary | ICD-10-CM

## 2013-05-16 DIAGNOSIS — G4710 Hypersomnia, unspecified: Secondary | ICD-10-CM

## 2013-05-16 NOTE — Telephone Encounter (Signed)
Needs ov to review sleep study results.  thanks

## 2013-05-16 NOTE — Telephone Encounter (Signed)
lmtcb x1 

## 2013-05-17 ENCOUNTER — Other Ambulatory Visit: Payer: Self-pay

## 2013-05-17 ENCOUNTER — Telehealth: Payer: Self-pay | Admitting: Family Medicine

## 2013-05-17 MED ORDER — EXENATIDE ER 2 MG ~~LOC~~ PEN: 2.0000 mg | PEN_INJECTOR | SUBCUTANEOUS | Status: DC

## 2013-05-17 NOTE — Telephone Encounter (Signed)
Spoke with pt and given appt with Dr Shelle Ironlance 05/18/13 at 12:00

## 2013-05-17 NOTE — Telephone Encounter (Signed)
Patient returning call.

## 2013-05-17 NOTE — Telephone Encounter (Signed)
Pt did not have rx for bydureon 2 mg. Pt call bydureon  into [facility] [address]

## 2013-05-17 NOTE — Telephone Encounter (Signed)
Rx sent to pharmacy   

## 2013-05-18 ENCOUNTER — Encounter (INDEPENDENT_AMBULATORY_CARE_PROVIDER_SITE_OTHER): Payer: Self-pay

## 2013-05-18 ENCOUNTER — Encounter: Payer: Self-pay | Admitting: Pulmonary Disease

## 2013-05-18 ENCOUNTER — Ambulatory Visit (INDEPENDENT_AMBULATORY_CARE_PROVIDER_SITE_OTHER): Payer: BC Managed Care – PPO | Admitting: Pulmonary Disease

## 2013-05-18 VITALS — BP 140/88 | HR 88 | Temp 98.1°F | Ht 74.0 in | Wt 345.2 lb

## 2013-05-18 DIAGNOSIS — G4733 Obstructive sleep apnea (adult) (pediatric): Secondary | ICD-10-CM

## 2013-05-18 NOTE — Progress Notes (Signed)
   Subjective:    Patient ID: Gabriel Waters, male    DOB: 03/26/1975, 38 y.o.   MRN: [id number]  HPI The patient comes in today for followup of his recent home sleep testing. He was found to have mild OSA, with an AHI of 7 events per hour. I have reviewed the study with him in detail, and answered all of his questions.   Review of Systems  Constitutional: Negative for fever and unexpected weight change.  HENT: Negative for congestion, dental problem, ear pain, nosebleeds, postnasal drip, rhinorrhea, sinus pressure, sneezing, sore throat and trouble swallowing.   Eyes: Negative for redness and itching.  Respiratory: Negative for cough, chest tightness, shortness of breath and wheezing.   Cardiovascular: Negative for palpitations and leg swelling.  Gastrointestinal: Negative for nausea and vomiting.  Genitourinary: Negative for dysuria.  Musculoskeletal: Negative for joint swelling.  Skin: Negative for rash.  Neurological: Negative for headaches.  Hematological: Does not bruise/bleed easily.  Psychiatric/Behavioral: Negative for dysphoric mood. The patient is not nervous/anxious.        Objective:   Physical Exam Morbidly obese male in no acute distress Nose without purulence or discharge noted Neck without lymphadenopathy or thyromegaly Lower extremities with mild edema, no cyanosis Alert and oriented, moves all 4 extremities       Assessment & Plan:

## 2013-05-18 NOTE — Patient Instructions (Signed)
Will start on cpap at a moderate pressure.  Please call if having issues with tolerance. Work on weight loss followup with me again in 8 weeks.

## 2013-05-18 NOTE — Assessment & Plan Note (Signed)
The patient has very mild obstructive sleep apnea by his recent home sleep test, but he is very symptomatic at night and during the day. He feels this is greatly impacting his quality of life, and would like to treat this aggressively if possible. I have outlined either a dental appliance or CPAP while working on weight loss, and he would prefer the latter. We'll initiate CPAP at a moderate pressure level, and see him back in 8 weeks. I have encouraged him to work aggressively on weight loss.

## 2013-06-06 ENCOUNTER — Ambulatory Visit: Payer: BC Managed Care – PPO | Admitting: Family Medicine

## 2013-06-08 ENCOUNTER — Encounter: Payer: Self-pay | Admitting: Family Medicine

## 2013-06-08 ENCOUNTER — Ambulatory Visit (INDEPENDENT_AMBULATORY_CARE_PROVIDER_SITE_OTHER): Payer: BC Managed Care – PPO | Admitting: Family Medicine

## 2013-06-08 VITALS — BP 140/80 | HR 91 | Wt 340.0 lb

## 2013-06-08 DIAGNOSIS — R030 Elevated blood-pressure reading, without diagnosis of hypertension: Secondary | ICD-10-CM

## 2013-06-08 DIAGNOSIS — R7989 Other specified abnormal findings of blood chemistry: Secondary | ICD-10-CM | POA: Insufficient documentation

## 2013-06-08 DIAGNOSIS — G4733 Obstructive sleep apnea (adult) (pediatric): Secondary | ICD-10-CM

## 2013-06-08 DIAGNOSIS — IMO0001 Reserved for inherently not codable concepts without codable children: Secondary | ICD-10-CM

## 2013-06-08 DIAGNOSIS — E1165 Type 2 diabetes mellitus with hyperglycemia: Principal | ICD-10-CM

## 2013-06-08 DIAGNOSIS — E291 Testicular hypofunction: Secondary | ICD-10-CM

## 2013-06-08 LAB — HM DIABETES FOOT EXAM: HM Diabetic Foot Exam: NORMAL

## 2013-06-08 MED ORDER — TESTOSTERONE 30 MG/ACT TD SOLN: TRANSDERMAL | Status: DC

## 2013-06-08 NOTE — Progress Notes (Signed)
   Subjective:    Patient ID: Gabriel Waters, male    DOB: 01/27/1975, 38 y.o.   MRN: [id number]  HPI Patient is here to follow up on diabetes. A1c last fall 13.4%. Most recent A1c 8.4%. He currently takes metformin and also taking Bydurion once weekly with no side effects. No nausea. Fasting blood sugars generally between 80 and 100. Unfortunately, his weight has not gone down and he has not been compliant with diet and exercise recently.  Low testosterone. Level recently 192. He had a couple of confirm low readings. Fatigue and erectile dysfunction continue. He is very interested in considering treatment.  Obstructive sleep apnea. Patient now has CPAP and is feeling more rested. He is being followed by pulmonology regarding that.  Past Medical History  Diagnosis Date  . DIABETES MELLITUS, TYPE II, UNCONTROLLED 01/02/2010  . ELEVATED BLOOD PRESSURE 12/31/2009   No past surgical history on file.  reports that he has never smoked. He does not have any smokeless tobacco history on file. He reports that he drinks alcohol. He reports that he does not use illicit drugs. family history includes Allergies in his mother; Diabetes in his father; Emphysema in his father; Heart disease in his father and mother; Lung cancer in his mother; Sarcoidosis in his father. Allergies  Allergen Reactions  . Lipitor [Atorvastatin] Other (See Comments)    Arthralgias.      Review of Systems  Constitutional: Negative for fatigue.  Eyes: Negative for visual disturbance.  Respiratory: Negative for cough, chest tightness and shortness of breath.   Cardiovascular: Negative for chest pain, palpitations and leg swelling.  Neurological: Negative for dizziness, syncope, weakness, light-headedness and headaches.       Objective:   Physical Exam  Constitutional: He is oriented to person, place, and time. He appears well-developed and well-nourished.  HENT:  Right Ear: External ear normal.  Left Ear: External ear  normal.  Mouth/Throat: Oropharynx is clear and moist.  Eyes: Pupils are equal, round, and reactive to light.  Neck: Neck supple. No thyromegaly present.  Cardiovascular: Normal rate and regular rhythm.   No murmur heard. Pulmonary/Chest: Effort normal and breath sounds normal. No respiratory distress. He has no wheezes. He has no rales.  Musculoskeletal: He exhibits no edema.  Neurological: He is alert and oriented to person, place, and time.          Assessment & Plan:  #1 type 2 diabetes. History of poor control. History of poor compliance. Order future labs with A1c in one to 2 months #2 low testosterone. We discussed options for replacement. 2 confirmed low readings and symptomatic.  Start Axelron 30 mg per axilla once daily. Recheck testosterone level approximately 2 months.  We reviewed potential side effects. #3 Obstructive sleep apnea currently on CPAP #4 borderline elevated blood pressure. We discussed medication options including ACE inhibitor and at this point he wishes to work on weight loss and reassess 2 months

## 2013-06-08 NOTE — Progress Notes (Signed)
Pre visit review using our clinic review tool, if applicable. No additional management support is needed unless otherwise documented below in the visit note. 

## 2013-06-08 NOTE — Patient Instructions (Signed)
Testosterone topical solution  What is this medicine?  TESTOSTERONE (tes TOS ter one) is the main male hormone. It supports normal male traits such as muscle growth, facial hair, and deep voice. This medicine is used in males to treat low testosterone levels.  This medicine may be used for other purposes; ask your health care provider or pharmacist if you have questions.  COMMON BRAND NAME(S): AXIRON  What should I tell my health care provider before I take this medicine?  They need to know if you have any of these conditions:  -breast cancer  -breathing problems while you sleep (sleep apnea)  -diabetes  -heart disease  -if a male partner is pregnant or trying to get pregnant  -kidney disease  -liver disease  -lung disease  -prostate cancer, enlargement  -an unusual or allergic reaction to testosterone, other medicines, foods, dyes, or preservatives  -pregnant or trying to get pregnant  -breast-feeding  How should I use this medicine?  This medicine is applied at the same time every day (preferably in the morning) to clean, dry, intact skin of the armpit. Follow the directions on the prescription label. If you take a bath or shower in the morning, apply the medicine after the bath or shower. If you use deodorants or antiperspirants, use them at least 2 minutes before applying this medicine. Only apply this medicine to the armpits. Do not use on any other body part. To use, remove the cap and the applicator cup from the pump. Fully depress the pump once to dispense solution into the applicator cup. With the applicator cup held upright, wipe the solution down and up into the armpit. Do not use your fingers or hand to rub the medicine into the skin. Allow the skin to dry a few minutes before putting on clothing. The skin solution is flammable. Avoid fire, flame, or smoking until the solution has dried. Wash your hands after use. Avoid bathing or swimming for at least 2 hours after you apply the medicine.  A special  MedGuide will be given to you by the pharmacist with each prescription and refill. Be sure to read this information carefully each time.  Talk to your pediatrician regarding the use of this medicine in children. Special care may be needed.  Overdosage: If you think you've taken too much of this medicine contact a poison control center or emergency room at once.  Overdosage: If you think you have taken too much of this medicine contact a poison control center or emergency room at once.  NOTE: This medicine is only for you. Do not share this medicine with others.  What if I miss a dose?  If you miss a dose, use it as soon as you can. If it is almost time for your next dose, use only that dose. Do not use double or extra doses.  What may interact with this medicine?  -certain medicines for diabetes  -certain medicines that treat or prevent blood clots like warfarin  -oxyphenbutazone  -propranolol  -steroid medicines like prednisone or cortisone  This list may not describe all possible interactions. Give your health care provider a list of all the medicines, herbs, non-prescription drugs, or dietary supplements you use. Also tell them if you smoke, drink alcohol, or use illegal drugs. Some items may interact with your medicine.  What should I watch for while using this medicine?  Visit your doctor or health care professional for regular checks on your progress. They will need to check the   level of testosterone in your blood.  This medicine can transfer from your body to others. If a person or pet comes in contact with the area where this medicine was applied to your skin, they may have a serious risk of side effects. If you cannot avoid skin-to-skin contact with another person, make sure the site where this medicine was applied is covered with clothing. If accidental contact happens, the skin of the person or pet should be washed right away with soap and water. Also, a male partner who is pregnant or trying to get  pregnant should avoid contact with the gel or treated skin.  This medicine may affect blood sugar levels. If you have diabetes, check with your doctor or health care professional before you change your diet or the dose of your diabetic medicine.  This drug is banned from use in athletes by most athletic organizations.  What side effects may I notice from receiving this medicine?  Side effects that you should report to your doctor or health care professional as soon as possible:  -allergic reactions like skin rash, itching or hives, swelling of the face, lips, or tongue  -breast enlargement  -breathing problems  -changes in emotions or moods, especially anger, depression, or rage  -dark urine  -general ill feeling or flu-like symptoms  -light-colored stools  -loss of appetite, nausea  -nausea, vomiting  -pain, swelling, warmth in the leg  -right upper belly pain  -stomach pain  -swelling of the ankles, feet, hands  -too frequent or persistent erections  -trouble passing urine or change in the amount of urine  -unusually weak or tired  -yellowing of the eyes or skinSide effects that usually do not require medical attention (Report these to your doctor or health care professional if they continue or are bothersome.):  -acne  -change in sex drive or performance  -diarrhea  -hair loss  -headache  This list may not describe all possible side effects. Call your doctor for medical advice about side effects. You may report side effects to FDA at 1-800-FDA-1088.  Where should I keep my medicine?  Keep out of the reach of children. This medicine can be abused. Keep your medicine in a safe place to protect it from theft. Do not share this medicine with anyone. Selling or giving away this medicine is dangerous and against the law.  Store at room temperature between 15 to 30 degrees C (59 to 86 degrees F). Keep closed until use. Protect from heat and light. This medicine is flammable. Avoid exposure to heat, fire, flame, and  smoking. Throw away any unused medicine after the expiration date.  NOTE: This sheet is a summary. It may not cover all possible information. If you have questions about this medicine, talk to your doctor, pharmacist, or health care provider.  © 2014, [employer]. (2008-12-21 10:17:45)

## 2013-07-13 ENCOUNTER — Ambulatory Visit (INDEPENDENT_AMBULATORY_CARE_PROVIDER_SITE_OTHER): Payer: BC Managed Care – PPO | Admitting: Pulmonary Disease

## 2013-07-13 ENCOUNTER — Encounter: Payer: Self-pay | Admitting: Pulmonary Disease

## 2013-07-13 VITALS — BP 140/82 | HR 110 | Temp 98.1°F | Ht 75.0 in | Wt 352.8 lb

## 2013-07-13 DIAGNOSIS — G4733 Obstructive sleep apnea (adult) (pediatric): Secondary | ICD-10-CM

## 2013-07-13 NOTE — Patient Instructions (Signed)
Continue on cpap, and keep up with mask cushion changes and supplies. Work on weight loss followup with me again in 6mos.

## 2013-07-13 NOTE — Assessment & Plan Note (Signed)
The patient is doing well with CPAP and has seen significant improvement in his sleep and daytime alertness. I have asked him to continue on his device, and to work aggressively on weight loss. I will see him back in 6 months if doing well.

## 2013-07-13 NOTE — Progress Notes (Signed)
   Subjective:    Patient ID: Gabriel Waters, male    DOB: 08/29/1975, 38 y.o.   MRN: [id number]  HPI Patient comes in today for followup of his obstructive sleep apnea. He was started on CPAP at the last visit, and has done very well with the device. He has seen a definite improvement in his sleep and daytime alertness, and it actually normalizes when he is able to get 6-7 hours on a consistent basis. He is having no issues with his pressure, but on rare occasions has issues with mouth opening. He can certainly try a chin strap but this continued.  His download today shows adequate compliance, no significant mask leak, and good control of his AHI.   Review of Systems  Constitutional: Negative for fever and unexpected weight change.  HENT: Negative for congestion, dental problem, ear pain, nosebleeds, postnasal drip, rhinorrhea, sinus pressure, sneezing, sore throat and trouble swallowing.   Eyes: Negative for redness and itching.  Respiratory: Negative for cough, chest tightness, shortness of breath and wheezing.   Cardiovascular: Negative for palpitations and leg swelling.  Gastrointestinal: Negative for nausea and vomiting.  Genitourinary: Negative for dysuria.  Musculoskeletal: Negative for joint swelling.  Skin: Negative for rash.  Neurological: Negative for headaches.  Hematological: Does not bruise/bleed easily.  Psychiatric/Behavioral: Negative for dysphoric mood. The patient is not nervous/anxious.        Objective:   Physical Exam Obese male in no acute distress Nose without purulence or discharge noted No skin breakdown or pressure necrosis from the CPAP mass Neck without lymphadenopathy or thyromegaly Lower extremities without edema, no cyanosis Alert, does not appear to be overly sleepy, moves all 4 extremities.       Assessment & Plan:

## 2013-07-19 ENCOUNTER — Telehealth: Payer: Self-pay | Admitting: Family Medicine

## 2013-07-19 MED ORDER — EXENATIDE ER 2 MG ~~LOC~~ PEN: 2.0000 mg | PEN_INJECTOR | SUBCUTANEOUS | Status: DC

## 2013-07-19 NOTE — Telephone Encounter (Signed)
[facility] - [place], [place] - [address] Is requesting re-fill on Exenatide ER (BYDUREON) 2 MG PEN

## 2013-07-19 NOTE — Telephone Encounter (Signed)
Rx sent to [facility]

## 2013-08-08 ENCOUNTER — Ambulatory Visit (INDEPENDENT_AMBULATORY_CARE_PROVIDER_SITE_OTHER): Payer: BC Managed Care – PPO | Admitting: Family Medicine

## 2013-08-08 ENCOUNTER — Encounter: Payer: Self-pay | Admitting: Family Medicine

## 2013-08-08 VITALS — BP 140/84 | HR 101 | Wt 348.0 lb

## 2013-08-08 DIAGNOSIS — E1165 Type 2 diabetes mellitus with hyperglycemia: Principal | ICD-10-CM

## 2013-08-08 DIAGNOSIS — R7989 Other specified abnormal findings of blood chemistry: Secondary | ICD-10-CM

## 2013-08-08 DIAGNOSIS — IMO0001 Reserved for inherently not codable concepts without codable children: Secondary | ICD-10-CM

## 2013-08-08 DIAGNOSIS — R030 Elevated blood-pressure reading, without diagnosis of hypertension: Secondary | ICD-10-CM

## 2013-08-08 DIAGNOSIS — E291 Testicular hypofunction: Secondary | ICD-10-CM

## 2013-08-08 LAB — HEMOGLOBIN A1C: HEMOGLOBIN A1C: 8.4 % — AB (ref 4.6–6.5)

## 2013-08-08 LAB — TESTOSTERONE: Testosterone: 644.81 ng/dL (ref 300.00–890.00)

## 2013-08-08 MED ORDER — LISINOPRIL 10 MG PO TABS: 10.0000 mg | ORAL_TABLET | Freq: Every day | ORAL | Status: DC

## 2013-08-08 NOTE — Progress Notes (Signed)
   Subjective:    Patient ID: Gabriel Waters, male    DOB: 01/18/1976, 38 y.o.   MRN: [id number]  HPI Follow multiple items  Type 2 diabetes. History of poor control. Recent A1c 8.4%. Unfortunately, he has gained 8 pounds since last visit. Poor compliance with exercise. No symptoms of polyuria or polydipsia.  Elevated blood pressure. We elected to wait and try weight loss options first. His weight is up as above. Never treated for high blood pressure. No headaches. No chest pains.  Low testosterone. Recently started Axeron which he is using consistently be has not seen any symptomatic improvement yet. Needs followup levels.  Past Medical History  Diagnosis Date  . DIABETES MELLITUS, TYPE II, UNCONTROLLED 01/02/2010  . ELEVATED BLOOD PRESSURE 12/31/2009   No past surgical history on file.  reports that he has never smoked. He does not have any smokeless tobacco history on file. He reports that he drinks alcohol. He reports that he does not use illicit drugs. family history includes Allergies in his mother; Diabetes in his father; Emphysema in his father; Heart disease in his father and mother; Lung cancer in his mother; Sarcoidosis in his father. Allergies  Allergen Reactions  . Lipitor [Atorvastatin] Other (See Comments)    Arthralgias.      Review of Systems  Constitutional: Positive for fatigue. Negative for fever, activity change and appetite change.  HENT: Negative for congestion, ear pain and trouble swallowing.   Eyes: Negative for pain and visual disturbance.  Respiratory: Negative for cough, shortness of breath and wheezing.   Cardiovascular: Negative for chest pain and palpitations.  Gastrointestinal: Negative for nausea, vomiting, abdominal pain, diarrhea, constipation, blood in stool, abdominal distention and rectal pain.  Endocrine: Negative for polydipsia and polyuria.  Genitourinary: Negative for dysuria, hematuria and testicular pain.  Musculoskeletal: Negative for  arthralgias and joint swelling.  Skin: Negative for rash.  Neurological: Negative for dizziness, syncope and headaches.  Hematological: Negative for adenopathy.  Psychiatric/Behavioral: Negative for confusion and dysphoric mood.       Objective:   Physical Exam  Constitutional: He appears well-developed and well-nourished.  Neck: Neck supple. No thyromegaly present.  Cardiovascular: Normal rate and regular rhythm.  Exam reveals no gallop.   No murmur heard. Pulmonary/Chest: Effort normal and breath sounds normal. No respiratory distress. He has no wheezes. He has no rales.  Musculoskeletal: He exhibits no edema.          Assessment & Plan:  #1 type 2 diabetes. History of poor control. Recheck A1c. Long discussion or guarding the importance of losing some weight #2 elevated blood pressure. Start lisinopril 10 mg once daily. Reviewed possible side effects. #3 low testosterone. Repeat testosterone level today and titrate medication as indicated

## 2013-08-08 NOTE — Progress Notes (Signed)
Pre visit review using our clinic review tool, if applicable. No additional management support is needed unless otherwise documented below in the visit note. 

## 2013-09-13 ENCOUNTER — Inpatient Hospital Stay (HOSPITAL_COMMUNITY)
Admission: EM | Admit: 2013-09-13 | Discharge: 2013-09-15 | DRG: 439 | Disposition: A | Discharge status: Home / Self Care | Payer: BC Managed Care – PPO | Attending: Internal Medicine | Admitting: Internal Medicine

## 2013-09-13 ENCOUNTER — Emergency Department (HOSPITAL_COMMUNITY): Payer: BC Managed Care – PPO

## 2013-09-13 ENCOUNTER — Encounter (HOSPITAL_COMMUNITY): Payer: Self-pay | Admitting: Emergency Medicine

## 2013-09-13 DIAGNOSIS — K7689 Other specified diseases of liver: Secondary | ICD-10-CM | POA: Diagnosis present

## 2013-09-13 DIAGNOSIS — E1165 Type 2 diabetes mellitus with hyperglycemia: Secondary | ICD-10-CM | POA: Diagnosis present

## 2013-09-13 DIAGNOSIS — I10 Essential (primary) hypertension: Secondary | ICD-10-CM | POA: Diagnosis present

## 2013-09-13 DIAGNOSIS — K859 Acute pancreatitis without necrosis or infection, unspecified: Secondary | ICD-10-CM

## 2013-09-13 DIAGNOSIS — Z6841 Body Mass Index (BMI) 40.0 and over, adult: Secondary | ICD-10-CM

## 2013-09-13 DIAGNOSIS — IMO0001 Reserved for inherently not codable concepts without codable children: Secondary | ICD-10-CM | POA: Diagnosis present

## 2013-09-13 DIAGNOSIS — I77811 Abdominal aortic ectasia: Secondary | ICD-10-CM | POA: Diagnosis present

## 2013-09-13 DIAGNOSIS — R030 Elevated blood-pressure reading, without diagnosis of hypertension: Secondary | ICD-10-CM

## 2013-09-13 DIAGNOSIS — R7989 Other specified abnormal findings of blood chemistry: Secondary | ICD-10-CM

## 2013-09-13 DIAGNOSIS — L910 Hypertrophic scar: Secondary | ICD-10-CM

## 2013-09-13 DIAGNOSIS — G4733 Obstructive sleep apnea (adult) (pediatric): Secondary | ICD-10-CM | POA: Diagnosis present

## 2013-09-13 DIAGNOSIS — L03221 Cellulitis of neck: Secondary | ICD-10-CM

## 2013-09-13 DIAGNOSIS — R109 Unspecified abdominal pain: Secondary | ICD-10-CM | POA: Diagnosis present

## 2013-09-13 DIAGNOSIS — L0211 Cutaneous abscess of neck: Secondary | ICD-10-CM

## 2013-09-13 DIAGNOSIS — K853 Drug induced acute pancreatitis without necrosis or infection: Secondary | ICD-10-CM

## 2013-09-13 DIAGNOSIS — E785 Hyperlipidemia, unspecified: Secondary | ICD-10-CM

## 2013-09-13 DIAGNOSIS — K858 Other acute pancreatitis without necrosis or infection: Secondary | ICD-10-CM

## 2013-09-13 DIAGNOSIS — E871 Hypo-osmolality and hyponatremia: Secondary | ICD-10-CM | POA: Diagnosis present

## 2013-09-13 DIAGNOSIS — R631 Polydipsia: Secondary | ICD-10-CM

## 2013-09-13 HISTORY — DX: Essential (primary) hypertension: I10

## 2013-09-13 HISTORY — DX: Acute pancreatitis without necrosis or infection, unspecified: K85.90

## 2013-09-13 HISTORY — DX: Sleep apnea, unspecified: G47.30

## 2013-09-13 LAB — URINALYSIS, ROUTINE W REFLEX MICROSCOPIC
BILIRUBIN URINE: NEGATIVE
Hgb urine dipstick: NEGATIVE
Ketones, ur: NEGATIVE mg/dL
Leukocytes, UA: NEGATIVE
Nitrite: NEGATIVE
PH: 6 (ref 5.0–8.0)
Protein, ur: NEGATIVE mg/dL
SPECIFIC GRAVITY, URINE: 1.035 — AB (ref 1.005–1.030)
Urobilinogen, UA: 0.2 mg/dL (ref 0.0–1.0)

## 2013-09-13 LAB — CBC WITH DIFFERENTIAL/PLATELET
BASOS ABS: 0 10*3/uL (ref 0.0–0.1)
BASOS PCT: 0 % (ref 0–1)
Eosinophils Absolute: 0.2 10*3/uL (ref 0.0–0.7)
Eosinophils Relative: 3 % (ref 0–5)
HEMATOCRIT: 42.2 % (ref 39.0–52.0)
HEMOGLOBIN: 14.1 g/dL (ref 13.0–17.0)
LYMPHS PCT: 17 % (ref 12–46)
Lymphs Abs: 1.4 10*3/uL (ref 0.7–4.0)
MCH: 26.4 pg (ref 26.0–34.0)
MCHC: 33.4 g/dL (ref 30.0–36.0)
MCV: 78.9 fL (ref 78.0–100.0)
MONOS PCT: 8 % (ref 3–12)
Monocytes Absolute: 0.7 10*3/uL (ref 0.1–1.0)
NEUTROS ABS: 5.8 10*3/uL (ref 1.7–7.7)
Neutrophils Relative %: 72 % (ref 43–77)
Platelets: 253 10*3/uL (ref 150–400)
RBC: 5.35 MIL/uL (ref 4.22–5.81)
RDW: 14.3 % (ref 11.5–15.5)
WBC: 8.2 10*3/uL (ref 4.0–10.5)

## 2013-09-13 LAB — COMPREHENSIVE METABOLIC PANEL
ALBUMIN: 3.6 g/dL (ref 3.5–5.2)
ALT: 16 U/L (ref 0–53)
AST: 15 U/L (ref 0–37)
Alkaline Phosphatase: 96 U/L (ref 39–117)
Anion gap: 13 (ref 5–15)
BUN: 13 mg/dL (ref 6–23)
CHLORIDE: 99 meq/L (ref 96–112)
CO2: 23 meq/L (ref 19–32)
Calcium: 8.8 mg/dL (ref 8.4–10.5)
Creatinine, Ser: 0.93 mg/dL (ref 0.50–1.35)
GFR calc Af Amer: >90 mL/min (ref >90)
GFR calc non Af Amer: >90 mL/min (ref >90)
Glucose, Bld: 317 mg/dL — ABNORMAL HIGH (ref 70–99)
Potassium: 4.5 mEq/L (ref 3.7–5.3)
Sodium: 135 mEq/L — ABNORMAL LOW (ref 137–147)
Total Bilirubin: 1.2 mg/dL (ref 0.3–1.2)
Total Protein: 7.4 g/dL (ref 6.0–8.3)

## 2013-09-13 LAB — GLUCOSE, CAPILLARY
GLUCOSE-CAPILLARY: 181 mg/dL — AB (ref 70–99)
Glucose-Capillary: 205 mg/dL — ABNORMAL HIGH (ref 70–99)
Glucose-Capillary: 185 mg/dL — ABNORMAL HIGH (ref 70–99)

## 2013-09-13 LAB — LIPASE, BLOOD: Lipase: 678 U/L — ABNORMAL HIGH (ref 11–59)

## 2013-09-13 LAB — TSH: TSH: 1.2 u[IU]/mL (ref 0.350–4.500)

## 2013-09-13 LAB — URINE MICROSCOPIC-ADD ON

## 2013-09-13 LAB — CBG MONITORING, ED: Glucose-Capillary: 233 mg/dL — ABNORMAL HIGH (ref 70–99)

## 2013-09-13 MED ORDER — ONDANSETRON HCL 4 MG/2ML IJ SOLN: 4.0000 mg | Freq: Four times a day (QID) | INTRAMUSCULAR | Status: DC | PRN

## 2013-09-13 MED ORDER — SODIUM CHLORIDE 0.9 % IV SOLN
INTRAVENOUS | Status: AC
  Administered 2013-09-13: via INTRAVENOUS
  Administered 2013-09-13 – 2013-09-14 (×2): 1000 mL via INTRAVENOUS

## 2013-09-13 MED ORDER — INSULIN ASPART 100 UNIT/ML ~~LOC~~ SOLN
0.0000 [IU] | SUBCUTANEOUS | Status: DC
  Administered 2013-09-13: 2 [IU] via SUBCUTANEOUS
  Administered 2013-09-13: 3 [IU] via SUBCUTANEOUS
  Administered 2013-09-13 – 2013-09-14 (×6): 2 [IU] via SUBCUTANEOUS
  Administered 2013-09-15: 1 [IU] via SUBCUTANEOUS
  Administered 2013-09-15: 5 [IU] via SUBCUTANEOUS
  Administered 2013-09-15: 2 [IU] via SUBCUTANEOUS
  Administered 2013-09-15: 1 [IU] via SUBCUTANEOUS

## 2013-09-13 MED ORDER — ACETAMINOPHEN 325 MG PO TABS: 650.0000 mg | ORAL_TABLET | Freq: Four times a day (QID) | ORAL | Status: DC | PRN

## 2013-09-13 MED ORDER — ENOXAPARIN SODIUM 40 MG/0.4ML ~~LOC~~ SOLN
40.0000 mg | SUBCUTANEOUS | Status: DC
  Administered 2013-09-13: 40 mg via SUBCUTANEOUS
  Filled 2013-09-13 (×2): qty 0.4

## 2013-09-13 MED ORDER — ALBUTEROL SULFATE (2.5 MG/3ML) 0.083% IN NEBU: 2.5000 mg | INHALATION_SOLUTION | RESPIRATORY_TRACT | Status: DC | PRN

## 2013-09-13 MED ORDER — SODIUM CHLORIDE 0.9 % IV BOLUS (SEPSIS)
1000.0000 mL | Freq: Once | INTRAVENOUS | Status: AC
  Administered 2013-09-13: 1000 mL via INTRAVENOUS

## 2013-09-13 MED ORDER — ONDANSETRON HCL 4 MG/2ML IJ SOLN
4.0000 mg | Freq: Once | INTRAMUSCULAR | Status: AC
  Administered 2013-09-13: 4 mg via INTRAVENOUS
  Filled 2013-09-13: qty 2

## 2013-09-13 MED ORDER — HYDROMORPHONE HCL PF 1 MG/ML IJ SOLN
1.0000 mg | INTRAMUSCULAR | Status: DC | PRN
  Administered 2013-09-13 – 2013-09-14 (×2): 1 mg via INTRAVENOUS
  Filled 2013-09-13 (×2): qty 1

## 2013-09-13 MED ORDER — ACETAMINOPHEN 650 MG RE SUPP: 650.0000 mg | Freq: Four times a day (QID) | RECTAL | Status: DC | PRN

## 2013-09-13 MED ORDER — MORPHINE SULFATE 4 MG/ML IJ SOLN
4.0000 mg | Freq: Once | INTRAMUSCULAR | Status: AC
  Administered 2013-09-13: 4 mg via INTRAVENOUS
  Filled 2013-09-13: qty 1

## 2013-09-13 MED ORDER — LISINOPRIL 10 MG PO TABS
10.0000 mg | ORAL_TABLET | Freq: Every day | ORAL | Status: DC
  Administered 2013-09-13 – 2013-09-15 (×3): 10 mg via ORAL
  Filled 2013-09-13 (×3): qty 1

## 2013-09-13 MED ORDER — ONDANSETRON HCL 4 MG PO TABS: 4.0000 mg | ORAL_TABLET | Freq: Four times a day (QID) | ORAL | Status: DC | PRN

## 2013-09-13 MED ORDER — MORPHINE SULFATE 4 MG/ML IJ SOLN
4.0000 mg | Freq: Once | INTRAMUSCULAR | Status: AC
Start: 2013-09-13 — End: 2013-09-13
  Administered 2013-09-13: 4 mg via INTRAVENOUS
  Filled 2013-09-13: qty 1

## 2013-09-13 MED ORDER — PANTOPRAZOLE SODIUM 40 MG PO TBEC
80.0000 mg | DELAYED_RELEASE_TABLET | Freq: Every day | ORAL | Status: DC
  Administered 2013-09-13 – 2013-09-15 (×3): 80 mg via ORAL
  Filled 2013-09-13 (×4): qty 2

## 2013-09-13 MED ORDER — OXYCODONE HCL 5 MG PO TABS
5.0000 mg | ORAL_TABLET | ORAL | Status: DC | PRN
  Administered 2013-09-13: 5 mg via ORAL
  Filled 2013-09-13: qty 1

## 2013-09-13 NOTE — Progress Notes (Signed)
Utilization review completed.  

## 2013-09-13 NOTE — ED Provider Notes (Signed)
Medical screening examination/treatment/procedure(s) were conducted as a shared visit with non-physician practitioner(s) and myself.  I personally evaluated the patient during the encounter.  Patient presented to the emergency room with complaints of right upper quadrant and epigastric abdominal pain. On exam he does have tenderness to palpation in the epigastrium.  Current values are consistent with pancreatitis. Ultrasound does not show gallstones. The patient denies any alcohol use. He did start a new medication that could possibly be the culprit.  Plan will be admitted to the hospital for pain medication, IV fluid hydration and monitoring.  Linwood Dibbles, MD 09/13/13 1351

## 2013-09-13 NOTE — Progress Notes (Signed)
Patient trasfered from ED to [phone] via stretcher; alert and oriented x 4; no complaints of pain; IV saline locked in RFA ; skin intact. Orient patient to room and unit; gave patient care guide; instructed how to use the call bell and  fall risk precautions. Will continue to monitor the patient.

## 2013-09-13 NOTE — ED Notes (Signed)
Pt in from home POV, pt c/o RUQ abd pain that radiates to epigastric area, evaluated at [facility] Sunday, reports fullness & sharp sensation in abd, denies, n/v/d, c/o constipation, denies CP, SOB, A&Ox4

## 2013-09-13 NOTE — Progress Notes (Signed)
UR completed Ferdinand Cava, RN, BSN, Case Manager, [phone] 09/13/2013 2:22 PM

## 2013-09-13 NOTE — ED Provider Notes (Signed)
CSN: [id number]     Arrival date & time 09/13/13  4098 History   First MD Initiated Contact with Patient 09/13/13 0703     Chief Complaint  Patient presents with  . Abdominal Pain     (Consider location/radiation/quality/duration/timing/severity/associated sxs/prior Treatment) HPI Comments: Patient is a 38 year old male with a past medical history of diabetes and hypertension who presents with abdominal pain for the past 5 days. The pain is located in RUQ and does not radiate. The pain is described as sharp and severe. The pain started gradually and progressively worsened since the onset. No alleviating/aggravating factors. The patient has tried gi cocktail for symptoms which provided transient relief. Associated symptoms include constipation. Patient denies fever, headache, NVD, chest pain, SOB, dysuria. No history of abdominal surgery.    Patient is a 38 y.o. male presenting with abdominal pain.  Abdominal Pain Associated symptoms: no chest pain, no chills, no diarrhea, no dysuria, no fatigue, no fever, no nausea, no shortness of breath and no vomiting     Past Medical History  Diagnosis Date  . DIABETES MELLITUS, TYPE II, UNCONTROLLED 01/02/2010  . ELEVATED BLOOD PRESSURE 12/31/2009  . Hypertension    History reviewed. No pertinent past surgical history. Family History  Problem Relation Age of Onset  . Diabetes Father   . Heart disease Mother   . Heart disease Father   . Allergies Mother   . Emphysema Father   . Sarcoidosis Father   . Lung cancer Mother    History  Substance Use Topics  . Smoking status: Never Smoker   . Smokeless tobacco: Not on file  . Alcohol Use: Yes     Comment: social--2-3 glasses wine occassionally    Review of Systems  Constitutional: Negative for fever, chills and fatigue.  HENT: Negative for trouble swallowing.   Eyes: Negative for visual disturbance.  Respiratory: Negative for shortness of breath.   Cardiovascular: Negative for chest pain  and palpitations.  Gastrointestinal: Positive for abdominal pain. Negative for nausea, vomiting and diarrhea.  Genitourinary: Negative for dysuria and difficulty urinating.  Musculoskeletal: Negative for arthralgias and neck pain.  Skin: Negative for color change.  Neurological: Negative for dizziness and weakness.  Psychiatric/Behavioral: Negative for dysphoric mood.      Allergies  Lipitor  Home Medications   Prior to Admission medications   Medication Sig Start Date End Date Taking? Authorizing Provider  Chlorpheniramine-PSE-Ibuprofen (ADVIL ALLERGY SINUS PO) Take 2 tablets by mouth 2 (two) times daily as needed (cold).   Yes Historical Provider, MD  Exenatide ER (BYDUREON) 2 MG PEN Inject 2 mg into the skin once a week. 07/19/13  Yes Kristian Covey, MD  fluticasone (FLONASE) 50 MCG/ACT nasal spray Place 2 sprays into both nostrils daily as needed for allergies or rhinitis.   Yes Historical Provider, MD  lisinopril (PRINIVIL,ZESTRIL) 10 MG tablet Take 1 tablet (10 mg total) by mouth daily. 08/08/13  Yes Kristian Covey, MD  metFORMIN (GLUCOPHAGE) 500 MG tablet Take 1,000 mg by mouth 2 (two) times daily with a meal.   Yes Historical Provider, MD  omeprazole (PRILOSEC) 40 MG capsule Take 40 mg by mouth daily.   Yes Historical Provider, MD  Testosterone Randa Ngo) 30 MG/ACT SOLN Apply one application (30 mg) to each axilla once daily 06/08/13  Yes Kristian Covey, MD   BP 123/77  Pulse 94  Temp(Src) 98.4 F (36.9 C) (Oral)  Resp 19  Ht  (1.905 m)  Wt 334 lb (151.501  kg)  BMI 41.75 kg/m2  SpO2 95% Physical Exam  Nursing note and vitals reviewed. Constitutional: He is oriented to person, place, and time. He appears well-developed and well-nourished. No distress.  HENT:  Head: Normocephalic and atraumatic.  Eyes: Conjunctivae and EOM are normal. No scleral icterus.  Neck: Normal range of motion.  Cardiovascular: Normal rate and regular rhythm.  Exam reveals no gallop and  no friction rub.   No murmur heard. Pulmonary/Chest: Effort normal and breath sounds normal. He has no wheezes. He has no rales. He exhibits no tenderness.  Abdominal: Soft. He exhibits no distension. There is tenderness. There is no rebound and no guarding.  RUQ tenderness to palpation. No other focal tenderness to palpation. No peritoneal signs.   Musculoskeletal: Normal range of motion.  Neurological: He is alert and oriented to person, place, and time. Coordination normal.  Speech is goal-oriented. Moves limbs without ataxia.   Skin: Skin is warm and dry.  Psychiatric: He has a normal mood and affect. His behavior is normal.    ED Course  Procedures (including critical care time) Labs Review Labs Reviewed  COMPREHENSIVE METABOLIC PANEL - Abnormal; Notable for the following:    Sodium 135 (*)    Glucose, Bld 317 (*)    All other components within normal limits  LIPASE, BLOOD - Abnormal; Notable for the following:    Lipase 678 (*)    All other components within normal limits  URINALYSIS, ROUTINE W REFLEX MICROSCOPIC - Abnormal; Notable for the following:    Specific Gravity, Urine 1.035 (*)    Glucose, UA >1000 (*)    All other components within normal limits  CBG MONITORING, ED - Abnormal; Notable for the following:    Glucose-Capillary 233 (*)    All other components within normal limits  CBC WITH DIFFERENTIAL  URINE MICROSCOPIC-ADD ON    Imaging Review US Abdomen Complete  09/13/2013   CLINICAL DATA:  Cholecystitis.  Abdominal pain  EXAM: ULTRASOUND ABDOMEN COMPLETE  COMPARISON:  None.  FINDINGS: Gallbladder:  No gallstones or wall thickening visualized. No sonographic Murphy sign noted.  Common bile duct:  Diameter: 4.9 mm  Liver:  The liver appears echogenic suggestive of hepatic steatosis. No focal liver abnormality.  IVC:  No abnormality visualized.  Pancreas:  Visualized portion unremarkable.  Spleen:  Size and appearance within normal limits.  Right Kidney:  Length:  13.4 cm. Echogenicity within normal limits. No mass or hydronephrosis visualized.  Left Kidney:  Length: 13.4 cm. Echogenicity within normal limits. No mass or hydronephrosis visualized.  Abdominal aorta:  Measures 2.6 cm.  Other findings:  None.  IMPRESSION: 1. No acute findings. 2. Echogenic liver suggestive of hepatic steatosis 3. Ectatic abdominal aorta at risk for aneurysm development. Recommend follow up by [place] in 5 years. This recommendation follows ACR consensus guidelines: White Paper of the ACR Incidental Findings Committee II on Vascular Findings. J Am Coll Radiol 2013; 10:789-794.   Electronically Signed   By: Signa Kell M.D.   On: 09/13/2013 10:19     EKG Interpretation None      MDM   Final diagnoses:  Other acute pancreatitis    8:29 AM Labs pending. Patient will have RUQ [place] to rule out gallbladder pathology. Vitals stable and patient afebrile. Patient given morphine for pain control.   Patient's US unremarkable for acute changes. Patient will be admitted for pancreatitis due to elevated lipase of 678.    Emilia Beck, PA-C 09/13/13 1527

## 2013-09-13 NOTE — H&P (Signed)
History and Physical  Gabriel Waters WUJ:[id number] DOB: 03-22-1975 DOA: 09/13/2013  Referring physician: Emilia Beck, ED PA PCP: Kristian Covey, MD  Outpatient Specialists:  1. None  Chief Complaint: Abdominal pain  HPI: Gabriel Waters is a 38 y.o. male with history of DM 2, HTN, HLD, morbid obesity, presented to the ED with complaints of abdominal pain. He was in his usual state of health until 3 days ago when he first experienced bloating sensation in his upper abdomen which he attributed to gas. Symptoms resolved after drinking some ginger ale. Next day he had similar symptoms and was seen at urgent care and was given GI cocktail and prescribed omeprazole. The night prior to admission, patient woke up with severe stabbing epigastric pain with radiation to right upper quadrant. This was associated with a single episode of mild nonbloody emesis. He denies fevers, chills or diarrhea. Constipation +. Social and occasional alcohol intake. No prior history of pancreatitis or gallstones. No recent change in medications. In the ED, lipase 678, LFTs and other lab work unremarkable. Ultrasound abdomen negative for gallstones. Hospitalist admission requested.  Review of Systems: All systems reviewed and apart from history of presenting illness, are negative.  Past Medical History  Diagnosis Date  . DIABETES MELLITUS, TYPE II, UNCONTROLLED 01/02/2010  . ELEVATED BLOOD PRESSURE 12/31/2009  . Hypertension    History reviewed. No pertinent past surgical history. Social History:  reports that he has never smoked. He does not have any smokeless tobacco history on file. He reports that he drinks alcohol. He reports that he does not use illicit drugs. Independent of activities of daily living. Has a girlfriend.  Allergies  Allergen Reactions  . Lipitor [Atorvastatin] Other (See Comments)    Arthralgias.    Family History  Problem Relation Age of Onset  . Diabetes Father   . Heart  disease Mother   . Heart disease Father   . Allergies Mother   . Emphysema Father   . Sarcoidosis Father   . Lung cancer Mother      Prior to Admission medications   Medication Sig Start Date End Date Taking? Authorizing Provider  Chlorpheniramine-PSE-Ibuprofen (ADVIL ALLERGY SINUS PO) Take 2 tablets by mouth 2 (two) times daily as needed (cold).   Yes Historical Provider, MD  Exenatide ER (BYDUREON) 2 MG PEN Inject 2 mg into the skin once a week. 07/19/13  Yes Kristian Covey, MD  fluticasone (FLONASE) 50 MCG/ACT nasal spray Place 2 sprays into both nostrils daily as needed for allergies or rhinitis.   Yes Historical Provider, MD  lisinopril (PRINIVIL,ZESTRIL) 10 MG tablet Take 1 tablet (10 mg total) by mouth daily. 08/08/13  Yes Kristian Covey, MD  metFORMIN (GLUCOPHAGE) 500 MG tablet Take 1,000 mg by mouth 2 (two) times daily with a meal.   Yes Historical Provider, MD  omeprazole (PRILOSEC) 40 MG capsule Take 40 mg by mouth daily.   Yes Historical Provider, MD  Testosterone Randa Ngo) 30 MG/ACT SOLN Apply one application (30 mg) to each axilla once daily 06/08/13  Yes Kristian Covey, MD   Physical Exam: Filed Vitals:   09/13/13 1100 09/13/13 1115 09/13/13 1130 09/13/13 1145  BP: 139/93 123/74 114/66 116/72  Pulse: 93 89 86 90  Temp:      TempSrc:      Resp:      Height:      Weight:      SpO2: 95% 95% 92% 93%   temperature 98.78F, respiratory rate 18 per  minute.   General exam: Moderately built and morbidly obese young male patient, lying comfortably supine on the gurney in no obvious distress.  Head, eyes and ENT: Nontraumatic and normocephalic. Pupils equally reacting to light and accommodation. Oral mucosa borderline hydration.  Neck: Supple. No JVD, carotid bruit or thyromegaly.  Lymphatics: No lymphadenopathy.  Respiratory system: Clear to auscultation. No increased work of breathing.  Cardiovascular system: S1 and S2 heard, RRR. No JVD, murmurs, gallops, clicks  or pedal edema.  Gastrointestinal system: Abdomen is nondistended, soft. Mild epigastric and RUQ tenderness without peritoneal signs. Normal bowel sounds heard. No organomegaly or masses appreciated.  Central nervous system: Alert and oriented. No focal neurological deficits.  Extremities: Symmetric 5 x 5 power. Peripheral pulses symmetrically felt.   Skin: No rashes or acute findings.  Musculoskeletal system: Negative exam.  Psychiatry: Pleasant and cooperative.   Labs on Admission:  Basic Metabolic Panel:  Recent Labs Lab 09/13/13 0714  NA 135*  K 4.5  CL 99  CO2 23  GLUCOSE 317*  BUN 13  CREATININE 0.93  CALCIUM 8.8   Liver Function Tests:  Recent Labs Lab 09/13/13 0714  AST 15  ALT 16  ALKPHOS 96  BILITOT 1.2  PROT 7.4  ALBUMIN 3.6    Recent Labs Lab 09/13/13 0714  LIPASE 678*   No results found for this basename: AMMONIA,  in the last 168 hours CBC:  Recent Labs Lab 09/13/13 0714  WBC 8.2  NEUTROABS 5.8  HGB 14.1  HCT 42.2  MCV 78.9  PLT 253   Cardiac Enzymes: No results found for this basename: CKTOTAL, CKMB, CKMBINDEX, TROPONINI,  in the last 168 hours  BNP (last 3 results) No results found for this basename: PROBNP,  in the last 8760 hours CBG: No results found for this basename: GLUCAP,  in the last 168 hours  Radiological Exams on Admission: US Abdomen Complete  09/13/2013   CLINICAL DATA:  Cholecystitis.  Abdominal pain  EXAM: ULTRASOUND ABDOMEN COMPLETE  COMPARISON:  None.  FINDINGS: Gallbladder:  No gallstones or wall thickening visualized. No sonographic Murphy sign noted.  Common bile duct:  Diameter: 4.9 mm  Liver:  The liver appears echogenic suggestive of hepatic steatosis. No focal liver abnormality.  IVC:  No abnormality visualized.  Pancreas:  Visualized portion unremarkable.  Spleen:  Size and appearance within normal limits.  Right Kidney:  Length: 13.4 cm. Echogenicity within normal limits. No mass or hydronephrosis  visualized.  Left Kidney:  Length: 13.4 cm. Echogenicity within normal limits. No mass or hydronephrosis visualized.  Abdominal aorta:  Measures 2.6 cm.  Other findings:  None.  IMPRESSION: 1. No acute findings. 2. Echogenic liver suggestive of hepatic steatosis 3. Ectatic abdominal aorta at risk for aneurysm development. Recommend follow up by [place] in 5 years. This recommendation follows ACR consensus guidelines: White Paper of the ACR Incidental Findings Committee II on Vascular Findings. J Am Coll Radiol 2013; 10:789-794.   Electronically Signed   By: Signa Kell M.D.   On: 09/13/2013 10:19     Assessment/Plan Principal Problem:   Pancreatitis, acute Active Problems:   DIABETES MELLITUS, TYPE II, UNCONTROLLED   Hyperlipidemia   Severe obesity (BMI >= 40)   OSA (obstructive sleep apnea)   Hypertension   Acute pancreatitis   1. Acute pancreatitis: Unclear etiology. No history of heavy alcohol intake. No gallstones seen on ultrasound abdomen. Patient is on Bydureon which does have a rare side effect of pancreatitis-hold Bydureon. We'll check fasting lipids.  N.p.o., IV fluids, pain management and follow lipase in a.m. Also consider outpatient GI evaluation for EUS in a few weeks after acute pancreatitis has settled for further evaluation. 2. Uncontrolled type II DM: Hold oral medications. SSI. May consider adding low-dose Lantus. Mild hyponatremia is secondary to hyperglycemia 3. Hypertension: Controlled. Continue lisinopril. 4. Hyperlipidemia: Not on medications at home. Check fasting lipids. Patient also wishes to check TSH. 5. Ectatic abdominal aorta on ultrasound: Outpatient followup as per recommendations above. 6. Morbid obesity: Patient counseled regarding healthy lifestyle, dieting and weight loss. 7. Fatty Liver: seen on [place]      Code Status: Full  Family Communication: Discussed with patient's mother and girlfriend at bedside.  Disposition Plan: Home when medically stable.    Time spent: 60 minutes  Leyana Whidden, MD, FACP, FHM. [facility] Pager [phone]  If 7PM-7AM, please contact night-coverage [email or web address] Password [facility] 09/13/2013, 12:18 PM

## 2013-09-14 LAB — COMPREHENSIVE METABOLIC PANEL
ALT: 13 U/L (ref 0–53)
AST: 10 U/L (ref 0–37)
Albumin: 3.1 g/dL — ABNORMAL LOW (ref 3.5–5.2)
Alkaline Phosphatase: 75 U/L (ref 39–117)
Anion gap: 8 (ref 5–15)
BILIRUBIN TOTAL: 1.4 mg/dL — AB (ref 0.3–1.2)
BUN: 10 mg/dL (ref 6–23)
CHLORIDE: 103 meq/L (ref 96–112)
CO2: 28 meq/L (ref 19–32)
CREATININE: 1 mg/dL (ref 0.50–1.35)
Calcium: 9.1 mg/dL (ref 8.4–10.5)
Glucose, Bld: 172 mg/dL — ABNORMAL HIGH (ref 70–99)
Potassium: 4.2 mEq/L (ref 3.7–5.3)
Sodium: 139 mEq/L (ref 137–147)
Total Protein: 6.8 g/dL (ref 6.0–8.3)

## 2013-09-14 LAB — GLUCOSE, CAPILLARY
Glucose-Capillary: 178 mg/dL — ABNORMAL HIGH (ref 70–99)
Glucose-Capillary: 179 mg/dL — ABNORMAL HIGH (ref 70–99)
Glucose-Capillary: 170 mg/dL — ABNORMAL HIGH (ref 70–99)
Glucose-Capillary: 184 mg/dL — ABNORMAL HIGH (ref 70–99)
Glucose-Capillary: 163 mg/dL — ABNORMAL HIGH (ref 70–99)

## 2013-09-14 LAB — LIPID PANEL
Cholesterol: 155 mg/dL (ref 0–200)
HDL: 25 mg/dL — ABNORMAL LOW
LDL Cholesterol: 107 mg/dL — ABNORMAL HIGH (ref 0–99)
Total CHOL/HDL Ratio: 6.2 ratio
Triglycerides: 116 mg/dL
VLDL: 23 mg/dL (ref 0–40)

## 2013-09-14 LAB — LIPASE, BLOOD: LIPASE: 620 U/L — AB (ref 11–59)

## 2013-09-14 MED ORDER — BISACODYL 10 MG RE SUPP
10.0000 mg | Freq: Once | RECTAL | Status: DC
  Filled 2013-09-14: qty 1

## 2013-09-14 MED ORDER — ENOXAPARIN SODIUM 80 MG/0.8ML ~~LOC~~ SOLN
75.0000 mg | SUBCUTANEOUS | Status: DC
  Administered 2013-09-14 – 2013-09-15 (×2): 75 mg via SUBCUTANEOUS
  Filled 2013-09-14 (×2): qty 0.8

## 2013-09-14 MED ORDER — SODIUM CHLORIDE 0.9 % IV SOLN
INTRAVENOUS | Status: DC
  Administered 2013-09-14 – 2013-09-15 (×2): via INTRAVENOUS

## 2013-09-14 NOTE — Plan of Care (Signed)
Problem: Phase I Progression Outcomes Goal: Initial discharge plan identified Outcome: Completed/Met Date Met:  09/14/13 To return home

## 2013-09-14 NOTE — Progress Notes (Signed)
[facility] PROGRESS NOTE  Gabriel Waters ZOX:[id number] DOB: 18-May-1975 DOA: 09/13/2013 PCP: Kristian Covey, MD  Assessment/Plan:  Acute pancreatitis -Advance to clear liquids- pain improved, belly soft, denies nausea or vomiting -fasting lipids- triglycerides 116 -lipase 620 (8/26) -No history of heavy alcohol intake -Patient on Bydureon which has a rare side effect of pancreatitis- hold Bydureon -unclear etiology-EUS follow up after acute pancreatitis has settled for further evaluation  Uncontrolled Type II DM -Hold oral medications -SSI  Hypertension -controlled -continue lisinopril  Hyperlipidemia -not on medication at home -fasting lipids with cholesterol 155 LDL 107  Morbid obesity -patient counseled regarding healthy lifestyle, dieting, and weight loss  Hepatic steatosis -seen on [place]  Ectatic abdominal aorta -at risk of aneurysm development -outpatient follow up-Recommend follow up by [place] in 5 years   Code Status: Full Family Communication: No family at bedside Disposition Plan: Home when medically stable   Consultants:  None  Procedures:  None  Antibiotics:  None  HPI: HPI: Gabriel Waters is a 38 y.o. male with history of DM 2, HTN, HLD, morbid obesity, presented to the ED with with complaints of abdominal pain for the past three days. The pain started as pressure in the epigastric area, which he attributed to gas, and drank ginger ale at home without relief of symptoms.  The next day he presented to an urgent care center and was given GI cocktail and prescribed omeprazole.  The night prior to admission patient woke up with severe epigastric pain radiating to the back and associated with a single episode of nonbloody emesis, and constipation.  He denied fevers, chills or diarrhea. He drinks alcohol socially, and denies prior history of pancreatitis or gallstones. No recent change in medications. In the ED, lipase 678, LFTs and other lab work  unremarkable. Ultrasound abdomen negative for gallstones. He was made NPO, given IV fluids, and pain management.   Subjective: Pain is improved but still feels some epigastric pressure, and feels constipated. He has ambulated to the bathroom.  He denies any nausea or vomiting.    Objective:  Filed Vitals:   09/14/13 0502  BP: 113/70  Pulse: 89  Temp: 98.5 F (36.9 C)  Resp: 16   No intake or output data in the 24 hours ending 09/14/13 1007 Filed Weights   09/13/13 0707 09/13/13 1543  Weight: 151.501 kg (334 lb) 151.501 kg (334 lb)    Exam:  Gen: Alert and oriented, obese male in NAD  HEENT: Normocephalic, atraumatic.  Pupils symmertrical.  Moist mucosa.   Chest: clear to auscultate bilaterally, no ronchi or rales  Cardiac: Regular rate and rhythm, S1-S2, no rubs murmurs or gallops  Abdomen: soft, non distended, tenderness in the epigastric area, +bowel sounds. No guarding or rigidity  Extremities: Symmetrical in appearance without cyanosis or edema  Neurological: Alert awake oriented to time place and person.  Psychiatric: Appears normal.   Data Reviewed: Basic Metabolic Panel:  Recent Labs Lab 09/13/13 0714 09/14/13 0702  NA 135* 139  K 4.5 4.2  CL 99 103  CO2 23 28  GLUCOSE 317* 172*  BUN 13 10  CREATININE 0.93 1.00  CALCIUM 8.8 9.1   Liver Function Tests:  Recent Labs Lab 09/13/13 0714 09/14/13 0702  AST 15 10  ALT 16 13  ALKPHOS 96 75  BILITOT 1.2 1.4*  PROT 7.4 6.8  ALBUMIN 3.6 3.1*    Recent Labs Lab 09/13/13 0714 09/14/13 0702  LIPASE 678* 620*   No results found for this basename: AMMONIA,  in the last 168 hours CBC:  Recent Labs Lab 09/13/13 0714  WBC 8.2  NEUTROABS 5.8  HGB 14.1  HCT 42.2  MCV 78.9  PLT 253   Cardiac Enzymes: No results found for this basename: CKTOTAL, CKMB, CKMBINDEX, TROPONINI,  in the last 168 hours BNP (last 3 results) No results found for this basename: PROBNP,  in the last 8760 hours CBG:  Recent  Labs Lab 09/13/13 1651 09/13/13 2046 09/13/13 2332 09/14/13 0338 09/14/13 0748  GLUCAP 205* 185* 181* 178* 179*    No results found for this or any previous visit (from the past 240 hour(s)).   Studies: US Abdomen Complete  09/13/2013   CLINICAL DATA:  Cholecystitis.  Abdominal pain  EXAM: ULTRASOUND ABDOMEN COMPLETE  COMPARISON:  None.  FINDINGS: Gallbladder:  No gallstones or wall thickening visualized. No sonographic Murphy sign noted.  Common bile duct:  Diameter: 4.9 mm  Liver:  The liver appears echogenic suggestive of hepatic steatosis. No focal liver abnormality.  IVC:  No abnormality visualized.  Pancreas:  Visualized portion unremarkable.  Spleen:  Size and appearance within normal limits.  Right Kidney:  Length: 13.4 cm. Echogenicity within normal limits. No mass or hydronephrosis visualized.  Left Kidney:  Length: 13.4 cm. Echogenicity within normal limits. No mass or hydronephrosis visualized.  Abdominal aorta:  Measures 2.6 cm.  Other findings:  None.  IMPRESSION: 1. No acute findings. 2. Echogenic liver suggestive of hepatic steatosis 3. Ectatic abdominal aorta at risk for aneurysm development. Recommend follow up by [place] in 5 years. This recommendation follows ACR consensus guidelines: White Paper of the ACR Incidental Findings Committee II on Vascular Findings. J Am Coll Radiol 2013; 10:789-794.   Electronically Signed   By: Signa Kell M.D.   On: 09/13/2013 10:19    Scheduled Meds: . bisacodyl  10 mg Rectal Once  . enoxaparin (LOVENOX) injection  40 mg Subcutaneous Q24H  . insulin aspart  0-9 Units Subcutaneous 6 times per day  . lisinopril  10 mg Oral Daily  . pantoprazole  80 mg Oral Daily   Continuous Infusions: . sodium chloride 1,000 mL (09/14/13 0837)    Principal Problem:   Pancreatitis, acute Active Problems:   DIABETES MELLITUS, TYPE II, UNCONTROLLED   Hyperlipidemia   Severe obesity (BMI >= 40)   OSA (obstructive sleep apnea)   Hypertension   Acute  pancreatitis    Time spent:   Darcus Austin [facility] Pager [phone].  If 7PM-7AM, please contact night-coverage at [email or web address], password [facility] 09/14/2013, 10:07 AM  LOS: 1 day   Attending Patient was seen, examined,treatment plan was discussed with the Physician extender. I have directly reviewed the clinical findings, lab, imaging studies and management of this patient in detail. I have made the necessary changes to the above noted documentation, and agree with the documentation, as recorded by the Physician extender.  Windell Norfolk MD [facility].

## 2013-09-15 LAB — GLUCOSE, CAPILLARY
GLUCOSE-CAPILLARY: 128 mg/dL — AB (ref 70–99)
GLUCOSE-CAPILLARY: 139 mg/dL — AB (ref 70–99)
GLUCOSE-CAPILLARY: 297 mg/dL — AB (ref 70–99)
Glucose-Capillary: 156 mg/dL — ABNORMAL HIGH (ref 70–99)

## 2013-09-15 LAB — BASIC METABOLIC PANEL
ANION GAP: 12 (ref 5–15)
BUN: 8 mg/dL (ref 6–23)
CALCIUM: 8.7 mg/dL (ref 8.4–10.5)
CO2: 24 meq/L (ref 19–32)
Chloride: 103 mEq/L (ref 96–112)
Creatinine, Ser: 0.91 mg/dL (ref 0.50–1.35)
GFR calc non Af Amer: >90 mL/min (ref >90)
Glucose, Bld: 138 mg/dL — ABNORMAL HIGH (ref 70–99)
Potassium: 3.8 mEq/L (ref 3.7–5.3)
SODIUM: 139 meq/L (ref 137–147)

## 2013-09-15 LAB — LIPASE, BLOOD: LIPASE: 191 U/L — AB (ref 11–59)

## 2013-09-15 MED ORDER — AMLODIPINE BESYLATE 5 MG PO TABS: 5.0000 mg | ORAL_TABLET | Freq: Every day | ORAL | Status: DC

## 2013-09-15 NOTE — Discharge Summary (Signed)
Physician Discharge Summary  Gabriel Waters ZOX:[id number] DOB: [birth year] DOA: 09/13/2013  PCP: Kristian Covey, MD  Admit date: 09/13/2013 Discharge date: 09/15/2013  Time spent: 40 minutes  Recommendations for Outpatient Follow-up:  1. Bydureon and Lisinopril discontinued due to pancreatitis 2. Management of diabetes, hypertension, and dyslipidemia. 3. Please refer to  gastroenterologist for possible EUS 4. Recommend follow up Ultrasound in 5 years on ectatic aorta.  Discharge Diagnoses:  Principal Problem:   Pancreatitis, acute Active Problems:   DIABETES MELLITUS, TYPE II, UNCONTROLLED   Hyperlipidemia   Severe obesity (BMI >= 40)   OSA (obstructive sleep apnea)   Hypertension   Acute pancreatitis   Discharge Condition: Stable  Diet recommendation: Heart healthy diet  Filed Weights   09/13/13 0707 09/13/13 1543  Weight: 151.501 kg (334 lb) 151.501 kg (334 lb)    History of present illness:   Gabriel Waters is 38 y.o. Male with history of DM 2, HTN, HLD, morbid obesity, presented to the ED with with complaints of abdominal pain for three days prior to admission. The pain was in the epigastric area and radiated to the back.  He presented to an urgent care center and was given GI cocktail and prescribed omeprazole, with little relief of symptoms.  He then presented to ED 8/25  And had an episode of nonbloody emesis, and constipation. He denied fevers, chills or diarrhea. He drinks alcohol socially, and denies prior history of pancreatitis or gallstones. No recent change in medications. In the ED, lipase 678, LFTs and other lab work unremarkable. Ultrasound abdomen negative for gallstones. He was made NPO, given IV fluids, and pain management.    Hospital Course:   Acute pancreatitis  -Possible due to Bydureon and lisinopril. No significant hx of ETOH use, no gall stone seen on Ultrasound Abd -slowly advanced diet- able to tolerate a solid diet at discharge.  -Fasting  lipids- triglycerides 116  -Lipase 620 >> 191 on discharge  -social alcohol drinking- advised to not drink alcohol as this can cause pancreatitis -Stopped diabetes medication, Bydureon, as it has rare effect of panceatitis.    -Unclear etiology-EUS follow up after acute pancreatitis has settled for further evaluation-have asked patient to get a GI referral from PCP on follow up   Uncontrolled Type II DM  -Stop taking Bydureon -continue taking Metformin and follow up with PCP for further management of diabetes  Hypertension  -BP stable -discontinued lisinopril -prescribed amlodipine 5 mg.    Hyperlipidemia  -not on medication at home  -fasting lipids with cholesterol 155 LDL 107  -Follow up with PCP to continue to monitor   Morbid obesity  -patient counseled regarding healthy lifestyle, dieting, and weight loss   Hepatic steatosis  -seen on [place]   Ectatic abdominal aorta  -at risk of aneurysm development  -outpatient follow up-Recommend follow up by Ultraound in 5 years   Procedures:  None  Consultations:  None  Discharge Exam: Filed Vitals:   09/15/13 1343  BP: 134/89  Pulse: 89  Temp: 98.6 F (37 C)  Resp: 20    Exam General: Well-developed and nourished. Alert with clear speech Eyes: Anicteric account.  Cardiovascular: Regular rate and rhythm.  No murmurs, rubs, or gallops. Respiratory: Clear to auscultate bilaterally.  No rhonchi or crepitations. Abdomen: Soft nontender bowel sounds present. No guarding or rigidity.  Musculoskeletal: No edema. Moves all extremities Psychiatric: Appears normal.  Neurologic: Alert awake oriented to time place and person.    Discharge Instructions  Discharge Instructions   Diet - low sodium heart healthy    Complete by:  As directed      Increase activity slowly    Complete by:  As directed             Medication List    STOP taking these medications       ADVIL ALLERGY SINUS PO     Exenatide ER 2 MG Pen   Commonly known as:  BYDUREON     lisinopril 10 MG tablet  Commonly known as:  PRINIVIL,ZESTRIL      TAKE these medications       amLODipine 5 MG tablet  Commonly known as:  NORVASC  Take 1 tablet (5 mg total) by mouth daily.     fluticasone 50 MCG/ACT nasal spray  Commonly known as:  FLONASE  Place 2 sprays into both nostrils daily as needed for allergies or rhinitis.     metFORMIN 500 MG tablet  Commonly known as:  GLUCOPHAGE  Take 1,000 mg by mouth 2 (two) times daily with a meal.     omeprazole 40 MG capsule  Commonly known as:  PRILOSEC  Take 40 mg by mouth daily.     Testosterone 30 MG/ACT Soln  Commonly known as:  AXIRON  Apply one application (30 mg) to each axilla once daily       Allergies  Allergen Reactions  . Lipitor [Atorvastatin] Other (See Comments)    Arthralgias.   Follow-up Information   Follow up with Kristian Covey, MD. Schedule an appointment as soon as possible for a visit in 1 week.   Specialty:  Family Medicine   Contact information:   [address] [place] [address] [phone]         The results of significant diagnostics from this hospitalization (including imaging, microbiology, ancillary and laboratory) are listed below for reference.    Significant Diagnostic Studies: US Abdomen Complete  09/13/2013   CLINICAL DATA:  Cholecystitis.  Abdominal pain  EXAM: ULTRASOUND ABDOMEN COMPLETE  COMPARISON:  None.  FINDINGS: Gallbladder:  No gallstones or wall thickening visualized. No sonographic Murphy sign noted.  Common bile duct:  Diameter: 4.9 mm  Liver:  The liver appears echogenic suggestive of hepatic steatosis. No focal liver abnormality.  IVC:  No abnormality visualized.  Pancreas:  Visualized portion unremarkable.  Spleen:  Size and appearance within normal limits.  Right Kidney:  Length: 13.4 cm. Echogenicity within normal limits. No mass or hydronephrosis visualized.  Left Kidney:  Length: 13.4 cm. Echogenicity  within normal limits. No mass or hydronephrosis visualized.  Abdominal aorta:  Measures 2.6 cm.  Other findings:  None.  IMPRESSION: 1. No acute findings. 2. Echogenic liver suggestive of hepatic steatosis 3. Ectatic abdominal aorta at risk for aneurysm development. Recommend follow up by [place] in 5 years. This recommendation follows ACR consensus guidelines: White Paper of the ACR Incidental Findings Committee II on Vascular Findings. J Am Coll Radiol 2013; 10:789-794.   Electronically Signed   By: Signa Kell M.D.   On: 09/13/2013 10:19    Microbiology: No results found for this or any previous visit (from the past 240 hour(s)).   Labs: Basic Metabolic Panel:  Recent Labs Lab 09/13/13 0714 09/14/13 0702 09/15/13 0646  NA 135* 139 139  K 4.5 4.2 3.8  CL 99 103 103  CO2 GLUCOSE 317* 172* 138*  BUN CREATININE 0.93 1.00 0.91  CALCIUM 8.8 9.1  8.7   Liver Function Tests:  Recent Labs Lab 09/13/13 0714 09/14/13 0702  AST 15 10  ALT 16 13  ALKPHOS 96 75  BILITOT 1.2 1.4*  PROT 7.4 6.8  ALBUMIN 3.6 3.1*    Recent Labs Lab 09/13/13 0714 09/14/13 0702 09/15/13 0646  LIPASE 678* 620* 191*   No results found for this basename: AMMONIA,  in the last 168 hours CBC:  Recent Labs Lab 09/13/13 0714  WBC 8.2  NEUTROABS 5.8  HGB 14.1  HCT 42.2  MCV 78.9  PLT 253   Cardiac Enzymes:   Recent Labs Lab 09/14/13 2010 09/15/13 0012 09/15/13 0401 09/15/13 0739 09/15/13 1152  GLUCAP 163* 156* 128* 139* 297*     Signed:  Illa Level PA-C  [facility] 09/15/2013, 2:45 PM  Attending Patient was seen, examined,treatment plan was discussed with the Physician extender. I have directly reviewed the clinical findings, lab, imaging studies and management of this patient in detail. I have made the necessary changes to the above noted documentation, and agree with the documentation, as recorded by the Physician extender.  Windell Norfolk MD [facility].

## 2013-09-15 NOTE — Progress Notes (Signed)
Gabriel Waters discharged Home per MD order.  Discharge instructions reviewed and discussed with the patient, all questions and concerns answered. Copy of instructions, care notes for pancreatitis & norvasc and scripts given to patient.    Medication List    STOP taking these medications       ADVIL ALLERGY SINUS PO     Exenatide ER 2 MG Pen  Commonly known as:  BYDUREON     lisinopril 10 MG tablet  Commonly known as:  PRINIVIL,ZESTRIL      TAKE these medications       amLODipine 5 MG tablet  Commonly known as:  NORVASC  Take 1 tablet (5 mg total) by mouth daily.     fluticasone 50 MCG/ACT nasal spray  Commonly known as:  FLONASE  Place 2 sprays into both nostrils daily as needed for allergies or rhinitis.     metFORMIN 500 MG tablet  Commonly known as:  GLUCOPHAGE  Take 1,000 mg by mouth 2 (two) times daily with a meal.     omeprazole 40 MG capsule  Commonly known as:  PRILOSEC  Take 40 mg by mouth daily.     Testosterone 30 MG/ACT Soln  Commonly known as:  AXIRON  Apply one application (30 mg) to each axilla once daily        Patients skin is clean, dry and intact, no evidence of skin break down. IV site discontinued and catheter remains intact. Site without signs and symptoms of complications. Dressing and pressure applied.  Patient escorted to car by NT in a wheelchair,  no distress noted upon discharge.  Gabriel Waters, Gabriel Waters 09/15/2013 3:59 PM

## 2013-09-15 NOTE — Care Management Note (Signed)
    Page 1 of 1   09/15/2013     2:46:32 PM CARE MANAGEMENT NOTE 09/15/2013  Patient:  Gabriel Waters, Gabriel Waters   Account Number:  [id number]  Date Initiated:  09/15/2013  Documentation initiated by:  Letha Cape  Subjective/Objective Assessment:   dx pancreatitis  admit- lives with family. pta indep.     Action/Plan:   Anticipated DC Date:  09/15/2013   Anticipated DC Plan:  HOME/SELF CARE      DC Planning Services  CM consult      Choice offered to / List presented to:             Status of service:  Completed, signed off Medicare Important Message given?  NO (If response is "NO", the following Medicare IM given date fields will be blank) Date Medicare IM given:   Medicare IM given by:   Date Additional Medicare IM given:   Additional Medicare IM given by:    Discharge Disposition:  HOME/SELF CARE  Per UR Regulation:  Reviewed for med. necessity/level of care/duration of stay  If discussed at Long Length of Stay Meetings, dates discussed:    Comments:  09/15/13 1442 Letha Cape RN ,BSN [phone] patient lives with family, pta indep.  No needs anticipated.

## 2013-09-15 NOTE — Progress Notes (Signed)
[facility] PROGRESS NOTE  Gabriel Waters ZOX:[id number] DOB: 1975/02/22 DOA: 09/13/2013 PCP: Kristian Covey, MD  Assessment/Plan:  Acute pancreatitis  -Advanced diet to soft food from full liquid-tolerating well. -Fasting lipids- triglycerides 116  -Lipase 191 (8/27)  -No history of heavy alcohol intake   -Patient on Bydureon which has a rare side effect of pancreatitis- hold Bydureon  -Unclear etiology-EUS follow up after acute pancreatitis has settled for further evaluation   Uncontrolled Type II DM  -Hold oral medications  -SSI   Hypertension  -controlled  -switch to amlodipine-stop Lisinopril on discharge (can cause pancreatitis as well)  Hyperlipidemia  -not on medication at home  -fasting lipids with cholesterol 155 LDL 107   Morbid obesity  -patient counseled regarding healthy lifestyle, dieting, and weight loss   Hepatic steatosis  -seen on [place]   Ectatic abdominal aorta  -at risk of aneurysm development  -outpatient follow up-Recommend follow up by [place] in 5 years   DVT Prophylaxis:  Lovenox  Code Status: Full Family Communication: No family at home Disposition Plan: Home when medically stable   Consultants:  None  Procedures:  None  Antibiotics:  None  HPI/Subjective: States he feels much better today with no abdominal pain, nausea, or vomiting.  Has ambulated to bathroom.  Objective: Filed Vitals:   09/15/13 1032  BP: 122/71  Pulse:   Temp:   Resp:     Intake/Output Summary (Last 24 hours) at 09/15/13 1303 Last data filed at 09/15/13 1013  Gross per 24 hour  Intake 1853.33 ml  Output      0 ml  Net 1853.33 ml   Filed Weights   09/13/13 0707 09/13/13 1543  Weight: 151.501 kg (334 lb) 151.501 kg (334 lb)    Exam:  Gen: Alert and oriented, in no acute distress  HEENT: Normocephalic, atraumatic.  Pupils symmertrical.  Moist mucosa.   Chest: clear to auscultate bilaterally, no ronchi or rales  Cardiac: Regular rate and  rhythm, S1-S2, no rubs murmurs or gallops  Abdomen: soft, non tender, non distended, +bowel sounds. No guarding or rigidity  Extremities: Symmetrical in appearance without cyanosis or edema  Neurological: Alert awake oriented to time place and person.  Psychiatric: Appears normal.   Data Reviewed: Basic Metabolic Panel:  Recent Labs Lab 09/13/13 0714 09/14/13 0702 09/15/13 0646  NA 135* 139 139  K 4.5 4.2 3.8  CL 99 103 103  CO2 GLUCOSE 317* 172* 138*  BUN CREATININE 0.93 1.00 0.91  CALCIUM 8.8 9.1 8.7   Liver Function Tests:  Recent Labs Lab 09/13/13 0714 09/14/13 0702  AST 15 10  ALT 16 13  ALKPHOS 96 75  BILITOT 1.2 1.4*  PROT 7.4 6.8  ALBUMIN 3.6 3.1*    Recent Labs Lab 09/13/13 0714 09/14/13 0702 09/15/13 0646  LIPASE 678* 620* 191*   No results found for this basename: AMMONIA,  in the last 168 hours CBC:  Recent Labs Lab 09/13/13 0714  WBC 8.2  NEUTROABS 5.8  HGB 14.1  HCT 42.2  MCV 78.9  PLT 253   Cardiac Enzymes: No results found for this basename: CKTOTAL, CKMB, CKMBINDEX, TROPONINI,  in the last 168 hours BNP (last 3 results) No results found for this basename: PROBNP,  in the last 8760 hours CBG:  Recent Labs Lab 09/14/13 2010 09/15/13 0012 09/15/13 0401 09/15/13 0739 09/15/13 1152  GLUCAP 163* 156* 128* 139* 297*    No results found for this or any  previous visit (from the past 240 hour(s)).   Studies: No results found.  Scheduled Meds: . bisacodyl  10 mg Rectal Once  . enoxaparin (LOVENOX) injection  75 mg Subcutaneous Q24H  . insulin aspart  0-9 Units Subcutaneous 6 times per day  . lisinopril  10 mg Oral Daily  . pantoprazole  80 mg Oral Daily   Continuous Infusions: . sodium chloride 75 mL/hr at 09/15/13 0602    Principal Problem:   Pancreatitis, acute Active Problems:   DIABETES MELLITUS, TYPE II, UNCONTROLLED   Hyperlipidemia   Severe obesity (BMI >= 40)   OSA (obstructive sleep  apnea)   Hypertension   Acute pancreatitis    Time spent:     Illa Level [facility]  [facility] Pager [phone]. If 7PM-7AM, please contact night-coverage at [email or web address], password [facility] 09/15/2013, 1:03 PM  LOS: 2 days   Attending Patient was seen, examined,treatment plan was discussed with the Physician extender. I have directly reviewed the clinical findings, lab, imaging studies and management of this patient in detail. I have made the necessary changes to the above noted documentation, and agree with the documentation, as recorded by the Physician extender.  Windell Norfolk MD [facility].

## 2013-09-15 NOTE — Discharge Instructions (Signed)
The acute pancreatitis may have been caused by your diabetes medication, Bydureon.  Stop taking this medication and follow up with your PCP for your diabetes management.  Your PCP  Should make a referral to a Gastroenterologist to follow up on the renal ultrasound obtained in the hospital.   Avoid drinking alcohol, which can cause pancreatitis.  Eat small frequent meals, as pancreatitis can be exacerbated by large fatty meals.

## 2013-09-19 ENCOUNTER — Encounter: Payer: Self-pay | Admitting: Family Medicine

## 2013-09-19 ENCOUNTER — Ambulatory Visit (INDEPENDENT_AMBULATORY_CARE_PROVIDER_SITE_OTHER): Payer: BC Managed Care – PPO | Admitting: Family Medicine

## 2013-09-19 VITALS — BP 130/90 | HR 105 | Wt 330.0 lb

## 2013-09-19 DIAGNOSIS — K858 Other acute pancreatitis without necrosis or infection: Secondary | ICD-10-CM

## 2013-09-19 DIAGNOSIS — K859 Acute pancreatitis without necrosis or infection, unspecified: Secondary | ICD-10-CM

## 2013-09-19 DIAGNOSIS — IMO0001 Reserved for inherently not codable concepts without codable children: Secondary | ICD-10-CM

## 2013-09-19 DIAGNOSIS — E1165 Type 2 diabetes mellitus with hyperglycemia: Secondary | ICD-10-CM

## 2013-09-19 DIAGNOSIS — I10 Essential (primary) hypertension: Secondary | ICD-10-CM

## 2013-09-19 MED ORDER — CANAGLIFLOZIN 100 MG PO TABS: 1.0000 | ORAL_TABLET | Freq: Every day | ORAL | Status: DC

## 2013-09-19 NOTE — Patient Instructions (Signed)
Continue weight loss efforts. Continue to avoid alcohol for now Lets plan followup in 3 months and we will reassess A1c then

## 2013-09-19 NOTE — Progress Notes (Signed)
Pre visit review using our clinic review tool, if applicable. No additional management support is needed unless otherwise documented below in the visit note. 

## 2013-09-19 NOTE — Progress Notes (Signed)
   Subjective:    Patient ID: Gabriel Waters, male    DOB: November 14, 1975, 38 y.o.   MRN: [id number]  HPI Hospital followup. Patient has obesity, type 2 diabetes, dyslipidemia, hypertension. He presented with three-day history of epigastric abdominal pain and minimal nausea. Elevated lipase. Ultrasound revealed no gallstones. No significant alcohol use. Triglycerides were normal. Patient had been on lisinopril and also Bydurion and it was considered that either one of these medications could have been the culprit. His symptoms promptly improved. Recommendation was for referral to gastroenterologist on discharge for possible EUS.  He is still using bland diet until stable this time.  Recent A1c 8.4%. He remains on metformin. Fasting blood sugars about 160. He has lost some weight with his pancreatitis. He was switched to amlodipine for hypertension. Blood pressures been stable. He has never had prior history of pancreatitis  Past Medical History  Diagnosis Date  . DIABETES MELLITUS, TYPE II, UNCONTROLLED 01/02/2010  . ELEVATED BLOOD PRESSURE 12/31/2009  . Hypertension   . Pancreatitis 09/13/2013  . Sleep apnea    Past Surgical History  Procedure Laterality Date  . Wisdom tooth extraction      reports that he has never smoked. He has never used smokeless tobacco. He reports that he drinks alcohol. He reports that he does not use illicit drugs. family history includes Allergies in his mother; Diabetes in his father; Emphysema in his father; Heart disease in his father and mother; Lung cancer in his mother; Sarcoidosis in his father. Allergies  Allergen Reactions  . Lipitor [Atorvastatin] Other (See Comments)    Arthralgias.      Review of Systems  Constitutional: Negative for fatigue.  Eyes: Negative for visual disturbance.  Respiratory: Negative for cough, chest tightness and shortness of breath.   Cardiovascular: Negative for chest pain, palpitations and leg swelling.  Gastrointestinal:  Negative for nausea, vomiting and abdominal pain.  Endocrine: Negative for polydipsia and polyuria.  Genitourinary: Negative for dysuria.  Neurological: Negative for dizziness, syncope, weakness, light-headedness and headaches.       Objective:   Physical Exam  Constitutional: He appears well-developed and well-nourished. No distress.  HENT:  Mouth/Throat: Oropharynx is clear and moist.  Cardiovascular: Normal rate and regular rhythm.   Pulmonary/Chest: Effort normal and breath sounds normal. No respiratory distress. He has no wheezes. He has no rales.  Abdominal: Soft. Bowel sounds are normal. He exhibits no distension and no mass. There is no tenderness. There is no rebound and no guarding.  Musculoskeletal: He exhibits no edema.  Skin: No rash noted.          Assessment & Plan:  #1 recent acute pancreatitis. Possibly related to medications. No gallstones. No alcohol history. Taken off lisinopril and Bydurion.  Symptomatically stable. Progress diet as tolerated. Consider GI referral for possible EUS. #2 hypertension stable. Continue amlodipine.   #3 type 2 diabetes. Continue metformin. Continue weight loss efforts. Add Invokana 100 mg once daily.  Will plan to repeat A1C in 3 months.

## 2013-09-20 LAB — HM DIABETES EYE EXAM

## 2013-09-23 ENCOUNTER — Encounter: Payer: Self-pay | Admitting: Gastroenterology

## 2013-09-27 ENCOUNTER — Encounter: Payer: Self-pay | Admitting: Family Medicine

## 2013-11-08 ENCOUNTER — Ambulatory Visit: Payer: BC Managed Care – PPO | Admitting: Family Medicine

## 2013-11-11 ENCOUNTER — Telehealth: Payer: Self-pay | Admitting: Family Medicine

## 2013-11-11 MED ORDER — NYSTATIN 100000 UNIT/GM EX CREA: 1.0000 "application " | TOPICAL_CREAM | Freq: Two times a day (BID) | CUTANEOUS | Status: DC

## 2013-11-11 NOTE — Telephone Encounter (Signed)
Pt would like some yeast inf med call into [facility] [address]. Pt stated invokana cause the problem

## 2013-11-11 NOTE — Telephone Encounter (Signed)
Please elaborate.  Skin rash? And if so location.  If pruritic penile rash OK to use Nystatin cream bid for one week. 30 gm

## 2013-11-11 NOTE — Telephone Encounter (Signed)
Pt stated rash was in private area. Sent Rx to pharmacy

## 2013-11-11 NOTE — Telephone Encounter (Signed)
Left message for patient to return call.

## 2013-11-23 ENCOUNTER — Ambulatory Visit (INDEPENDENT_AMBULATORY_CARE_PROVIDER_SITE_OTHER): Payer: BC Managed Care – PPO | Admitting: Gastroenterology

## 2013-11-23 ENCOUNTER — Encounter: Payer: Self-pay | Admitting: Gastroenterology

## 2013-11-23 ENCOUNTER — Telehealth: Payer: Self-pay

## 2013-11-23 VITALS — BP 132/80 | HR 78 | Ht 74.0 in | Wt 336.4 lb

## 2013-11-23 DIAGNOSIS — K521 Toxic gastroenteritis and colitis: Secondary | ICD-10-CM

## 2013-11-23 DIAGNOSIS — K853 Drug induced acute pancreatitis without necrosis or infection: Secondary | ICD-10-CM

## 2013-11-23 DIAGNOSIS — K219 Gastro-esophageal reflux disease without esophagitis: Secondary | ICD-10-CM

## 2013-11-23 DIAGNOSIS — K760 Fatty (change of) liver, not elsewhere classified: Secondary | ICD-10-CM

## 2013-11-23 NOTE — Patient Instructions (Signed)
You have been provided a handout on antireflux measures to follow. Please continue your omeprazole and see Dr. Russella DarStark as needed  Thank you for choosing Dr. Russella DarStark and [facility]

## 2013-11-23 NOTE — Telephone Encounter (Signed)
-----   Message from Meryl DareMalcolm T Stark, MD sent at 11/23/2013 12:30 PM EST ----- Please add bydureon and lisinopril to his allergies causing pancreatitis

## 2013-11-23 NOTE — Telephone Encounter (Signed)
Allergies added

## 2013-11-23 NOTE — Progress Notes (Signed)
    History of Present Illness: This is a 38 year old male recently hospitalized at [facility] for mild acute pancreatitis felt secondary to either bydureon or lisinopril. Both medications were stopped, his symptoms resolved rapidly. Lipase rapidly decreased from initial levels above 600 to 100. He was discharged home in 2-3 days. Abdominal ultrasound imaging showed an echogenic liver suggestive of hepatic steatosis and an ectatic abdominal aorta. LFTs were normal. He was recently been started on omeprazole 40 mg daily for treatment of GERD which has been working well. Immediately after starting metformin he has noted frequent diarrhea. Denies weight loss, abdominal pain, constipation, change in stool caliber, melena, hematochezia, nausea, vomiting, dysphagia, chest pain.  Review of Systems: Pertinent positive and negative review of systems were noted in the above HPI section. All other review of systems were otherwise negative.  Current Medications, Allergies, Past Medical History, Past Surgical History, Family History and Social History were reviewed in [facility] electronic medical record.  Physical Exam: General: Well developed , well nourished, no acute distress Head: Normocephalic and atraumatic Eyes:  sclerae anicteric, EOMI Ears: Normal auditory acuity Mouth: No deformity or lesions Neck: Supple, no masses or thyromegaly Lungs: Clear throughout to auscultation Heart: Regular rate and rhythm; no murmurs, rubs or bruits Abdomen: Soft, non tender and non distended. No masses, hepatosplenomegaly or hernias noted. Normal Bowel sounds Musculoskeletal: Symmetrical with no gross deformities  Skin: No lesions on visible extremities Pulses:  Normal pulses noted Extremities: No clubbing, cyanosis, edema or deformities note Neurological: Alert oriented x 4, grossly nonfocal Cervical Nodes:  No significant cervical adenopathy Inguinal Nodes: No significant inguinal  adenopathy Psychological:  Alert and cooperative. Normal mood and affect  Assessment and Recommendations:  1. Acute pancreatitis, resolved. Very likely drug-induced by either bydureon or lisinopril so I recommend avoiding both medications in the future. No additional evaluation for pancreatitis at this time. If he has recurrent episodes will pursue further evaluation. GI follow up prn.  2. GERD. Continue standard antireflux measures and omeprazole 40 mg daily. Follow up with PCP. GI follow up prn.  3. Diarrhea. Side effect of metformin. Follow-up with PCP.  4. Hepatic steatosis. Long-term weight loss program, low-fat diet with adequate control of DM and hyperlipidemia per PCP.  5. Ectatic abdominal aorta. Follow-up with PCP.

## 2013-12-19 ENCOUNTER — Ambulatory Visit: Payer: BC Managed Care – PPO | Admitting: Family Medicine

## 2014-01-16 ENCOUNTER — Ambulatory Visit: Payer: BC Managed Care – PPO | Admitting: Pulmonary Disease

## 2014-01-20 ENCOUNTER — Other Ambulatory Visit: Payer: Self-pay | Admitting: Family Medicine

## 2014-02-07 ENCOUNTER — Telehealth: Payer: Self-pay | Admitting: Family Medicine

## 2014-02-07 NOTE — Telephone Encounter (Signed)
FYI: [facility] sent a PA request for Victoza.  The PA was approved but I don't see it listed on the med list.  Other preferred cheaper alternatives are Bydureon, Byetta, and Trulicity.

## 2014-02-08 NOTE — Telephone Encounter (Signed)
He cannot take ANY drugs in this class b/o prior pancreatitis hx.

## 2014-03-02 ENCOUNTER — Other Ambulatory Visit: Payer: Self-pay | Admitting: Family Medicine

## 2014-05-18 ENCOUNTER — Other Ambulatory Visit (INDEPENDENT_AMBULATORY_CARE_PROVIDER_SITE_OTHER): Payer: BLUE CROSS/BLUE SHIELD

## 2014-05-18 DIAGNOSIS — E1165 Type 2 diabetes mellitus with hyperglycemia: Secondary | ICD-10-CM

## 2014-05-18 DIAGNOSIS — E291 Testicular hypofunction: Secondary | ICD-10-CM | POA: Diagnosis not present

## 2014-05-18 DIAGNOSIS — Z0000 Encounter for general adult medical examination without abnormal findings: Secondary | ICD-10-CM

## 2014-05-18 DIAGNOSIS — R7989 Other specified abnormal findings of blood chemistry: Secondary | ICD-10-CM

## 2014-05-18 DIAGNOSIS — IMO0002 Reserved for concepts with insufficient information to code with codable children: Secondary | ICD-10-CM

## 2014-05-18 LAB — HEMOGLOBIN A1C: Hgb A1c MFr Bld: 10.6 % — ABNORMAL HIGH (ref 4.6–6.5)

## 2014-05-18 LAB — HEPATIC FUNCTION PANEL
ALBUMIN: 4.1 g/dL (ref 3.5–5.2)
ALK PHOS: 94 U/L (ref 39–117)
ALT: 21 U/L (ref 0–53)
AST: 17 U/L (ref 0–37)
Bilirubin, Direct: 0.3 mg/dL (ref 0.0–0.3)
Total Bilirubin: 1.7 mg/dL — ABNORMAL HIGH (ref 0.2–1.2)
Total Protein: 6.8 g/dL (ref 6.0–8.3)

## 2014-05-18 LAB — CBC WITH DIFFERENTIAL/PLATELET
Basophils Absolute: 0 10*3/uL (ref 0.0–0.1)
Basophils Relative: 0.7 % (ref 0.0–3.0)
Eosinophils Absolute: 0.1 10*3/uL (ref 0.0–0.7)
Eosinophils Relative: 1.9 % (ref 0.0–5.0)
HCT: 43.3 % (ref 39.0–52.0)
HEMOGLOBIN: 14.3 g/dL (ref 13.0–17.0)
LYMPHS ABS: 1.4 10*3/uL (ref 0.7–4.0)
Lymphocytes Relative: 20.1 % (ref 12.0–46.0)
MCHC: 33 g/dL (ref 30.0–36.0)
MCV: 78.4 fl (ref 78.0–100.0)
Monocytes Absolute: 0.6 10*3/uL (ref 0.1–1.0)
Monocytes Relative: 8 % (ref 3.0–12.0)
Neutro Abs: 4.8 10*3/uL (ref 1.4–7.7)
Neutrophils Relative %: 69.3 % (ref 43.0–77.0)
PLATELETS: 299 10*3/uL (ref 150.0–400.0)
RBC: 5.52 Mil/uL (ref 4.22–5.81)
RDW: 15.6 % — AB (ref 11.5–15.5)
WBC: 7 10*3/uL (ref 4.0–10.5)

## 2014-05-18 LAB — BASIC METABOLIC PANEL
BUN: 11 mg/dL (ref 6–23)
CALCIUM: 9.6 mg/dL (ref 8.4–10.5)
CO2: 31 meq/L (ref 19–32)
CREATININE: 0.95 mg/dL (ref 0.40–1.50)
Chloride: 99 mEq/L (ref 96–112)
GFR: 113.08 mL/min (ref >60.00)
Glucose, Bld: 254 mg/dL — ABNORMAL HIGH (ref 70–99)
Potassium: 4.1 mEq/L (ref 3.5–5.1)
Sodium: 138 mEq/L (ref 135–145)

## 2014-05-18 LAB — LIPID PANEL
Cholesterol: 180 mg/dL (ref 0–200)
HDL: 35.6 mg/dL — ABNORMAL LOW (ref >39.00)
LDL Cholesterol: 115 mg/dL — ABNORMAL HIGH (ref 0–99)
NONHDL: 144.4
Total CHOL/HDL Ratio: 5
Triglycerides: 145 mg/dL (ref 0.0–149.0)
VLDL: 29 mg/dL (ref 0.0–40.0)

## 2014-05-18 LAB — TESTOSTERONE: Testosterone: 271.95 ng/dL — ABNORMAL LOW (ref 300.00–890.00)

## 2014-05-18 LAB — PSA: PSA: 0.79 ng/mL (ref 0.10–4.00)

## 2014-05-18 LAB — TSH: TSH: 0.66 u[IU]/mL (ref 0.35–4.50)

## 2014-05-23 ENCOUNTER — Encounter: Payer: Self-pay | Admitting: Family Medicine

## 2014-05-23 ENCOUNTER — Ambulatory Visit (INDEPENDENT_AMBULATORY_CARE_PROVIDER_SITE_OTHER): Payer: BLUE CROSS/BLUE SHIELD | Admitting: Family Medicine

## 2014-05-23 VITALS — BP 130/80 | HR 89 | Temp 98.6°F | Ht 73.23 in | Wt 336.0 lb

## 2014-05-23 DIAGNOSIS — Z23 Encounter for immunization: Secondary | ICD-10-CM | POA: Diagnosis not present

## 2014-05-23 DIAGNOSIS — Z0000 Encounter for general adult medical examination without abnormal findings: Secondary | ICD-10-CM

## 2014-05-23 DIAGNOSIS — R7989 Other specified abnormal findings of blood chemistry: Secondary | ICD-10-CM

## 2014-05-23 DIAGNOSIS — E785 Hyperlipidemia, unspecified: Secondary | ICD-10-CM

## 2014-05-23 DIAGNOSIS — E1165 Type 2 diabetes mellitus with hyperglycemia: Secondary | ICD-10-CM

## 2014-05-23 MED ORDER — TESTOSTERONE 40.5 MG/2.5GM (1.62%) TD GEL: TRANSDERMAL | Status: DC

## 2014-05-23 NOTE — Progress Notes (Signed)
Subjective:    Patient ID: Gabriel Waters, male    DOB: 1975-10-13, 39 y.o.   MRN: [id number]  HPI Patient seen for complete physical. He has history of obesity, hypertension, dyslipidemia, type 2 diabetes which is been poorly controlled, low testosterone, obstructive sleep apnea. He had poorly controlled diabetes and history of poor compliance. We tried Bydurion but he had acute pancreatitis. We then tried Invokana but he had yeast infection issues. He currently takes only metformin 500 mg 2 twice daily. Not checking blood sugars. His weight is asked up 6 pounds from last visit. The takes amlodipine for hypertension. No consistent exercise. Currently works 2 jobs. Last tetanus unknown. No history of Pneumovax. Nonsmoker.  History of low testosterone. Currently not on replacement. Increased fatigue issues and very low libido.  Past Medical History  Diagnosis Date  . DIABETES MELLITUS, TYPE II, UNCONTROLLED 01/02/2010  . ELEVATED BLOOD PRESSURE 12/31/2009  . Hypertension   . Pancreatitis 09/13/2013  . Sleep apnea    Past Surgical History  Procedure Laterality Date  . Wisdom tooth extraction      reports that he has never smoked. He has never used smokeless tobacco. He reports that he drinks alcohol. He reports that he does not use illicit drugs. family history includes Allergies in his mother; Diabetes in his father; Emphysema in his father; Heart disease in his father and mother; Lung cancer in his mother; Sarcoidosis in his father. Allergies  Allergen Reactions  . Bydureon [Exenatide] Other (See Comments)    pancreatitis  . Lipitor [Atorvastatin] Other (See Comments)    Arthralgias.  . Lisinopril Other (See Comments)    pancreatitis      Review of Systems  Constitutional: Positive for fatigue. Negative for fever, activity change and appetite change.  HENT: Negative for congestion, ear pain and trouble swallowing.   Eyes: Negative for pain and visual disturbance.  Respiratory:  Negative for cough, shortness of breath and wheezing.   Cardiovascular: Negative for chest pain and palpitations.  Gastrointestinal: Negative for nausea, vomiting, abdominal pain, diarrhea, constipation, blood in stool, abdominal distention and rectal pain.  Endocrine: Negative for polydipsia and polyuria.  Genitourinary: Negative for dysuria, hematuria and testicular pain.  Musculoskeletal: Negative for joint swelling and arthralgias.  Skin: Negative for rash.  Neurological: Negative for dizziness, syncope and headaches.  Hematological: Negative for adenopathy.  Psychiatric/Behavioral: Negative for confusion and dysphoric mood.       Objective:   Physical Exam  Constitutional: He is oriented to person, place, and time. He appears well-developed and well-nourished. No distress.  HENT:  Head: Normocephalic and atraumatic.  Right Ear: External ear normal.  Left Ear: External ear normal.  Mouth/Throat: Oropharynx is clear and moist.  Eyes: Conjunctivae and EOM are normal. Pupils are equal, round, and reactive to light.  Neck: Normal range of motion. Neck supple. No thyromegaly present.  Cardiovascular: Normal rate, regular rhythm and normal heart sounds.   No murmur heard. Pulmonary/Chest: No respiratory distress. He has no wheezes. He has no rales.  Abdominal: Soft. Bowel sounds are normal. He exhibits no distension and no mass. There is no tenderness. There is no rebound and no guarding.  Musculoskeletal: He exhibits no edema.  Lymphadenopathy:    He has no cervical adenopathy.  Neurological: He is alert and oriented to person, place, and time. He displays normal reflexes. No cranial nerve deficit.  Skin: No rash noted.  Psychiatric: He has a normal mood and affect.  Assessment & Plan:  Complete physical. Patient has multiple chronic problems as above. He has morbid obesity with poorly controlled diabetes, dyslipidemia, low testosterone. We had a very long discussion  with patient and his fiance. We have suggested more aggressive treatment of his diabetes. He is reluctant to take insulin. He cannot take GLP-1 or SG LT-2 class because of side effects as above. Would be reluctant to use sulfonylurea with his obesity issues. He prefers weight loss and reassess 3 months. Set up education with certified diabetes educator.  Tetanus booster and Pneumovax given  Low testosterone. He's been on replacement the past. Increased fatigue and low libido. Start back androgen gel 1.62% 1 pump spray per arm once daily. Check testosterone level at follow-up

## 2014-05-23 NOTE — Progress Notes (Signed)
Pre visit review using our clinic review tool, if applicable. No additional management support is needed unless otherwise documented below in the visit note. 

## 2014-05-23 NOTE — Patient Instructions (Signed)
Lose weight! Establish more consistent exercise We will call you regarding certified diabetes educator Start back topical testosterone replacement and we will plan to recheck levels at follow-up in 3 months

## 2014-06-07 ENCOUNTER — Ambulatory Visit: Payer: BLUE CROSS/BLUE SHIELD | Admitting: *Deleted

## 2014-07-18 ENCOUNTER — Emergency Department (HOSPITAL_COMMUNITY): Payer: BLUE CROSS/BLUE SHIELD

## 2014-07-18 ENCOUNTER — Encounter (HOSPITAL_COMMUNITY): Payer: Self-pay | Admitting: Emergency Medicine

## 2014-07-18 ENCOUNTER — Emergency Department (HOSPITAL_COMMUNITY)
Admission: EM | Admit: 2014-07-18 | Discharge: 2014-07-18 | Disposition: A | Discharge status: Home / Self Care | Payer: BLUE CROSS/BLUE SHIELD | Attending: Emergency Medicine | Admitting: Emergency Medicine

## 2014-07-18 ENCOUNTER — Other Ambulatory Visit: Payer: Self-pay

## 2014-07-18 DIAGNOSIS — M545 Low back pain, unspecified: Secondary | ICD-10-CM

## 2014-07-18 DIAGNOSIS — R739 Hyperglycemia, unspecified: Secondary | ICD-10-CM

## 2014-07-18 DIAGNOSIS — E1165 Type 2 diabetes mellitus with hyperglycemia: Secondary | ICD-10-CM | POA: Insufficient documentation

## 2014-07-18 DIAGNOSIS — Z79899 Other long term (current) drug therapy: Secondary | ICD-10-CM | POA: Diagnosis not present

## 2014-07-18 DIAGNOSIS — E669 Obesity, unspecified: Secondary | ICD-10-CM | POA: Diagnosis not present

## 2014-07-18 DIAGNOSIS — G629 Polyneuropathy, unspecified: Secondary | ICD-10-CM

## 2014-07-18 DIAGNOSIS — K529 Noninfective gastroenteritis and colitis, unspecified: Secondary | ICD-10-CM | POA: Diagnosis not present

## 2014-07-18 DIAGNOSIS — R079 Chest pain, unspecified: Secondary | ICD-10-CM | POA: Diagnosis not present

## 2014-07-18 DIAGNOSIS — I10 Essential (primary) hypertension: Secondary | ICD-10-CM | POA: Diagnosis not present

## 2014-07-18 LAB — CBC WITH DIFFERENTIAL/PLATELET
Basophils Absolute: 0 10*3/uL (ref 0.0–0.1)
Basophils Relative: 0 % (ref 0–1)
EOS ABS: 0.1 10*3/uL (ref 0.0–0.7)
Eosinophils Relative: 1 % (ref 0–5)
HEMATOCRIT: 42.4 % (ref 39.0–52.0)
Hemoglobin: 13.9 g/dL (ref 13.0–17.0)
Lymphocytes Relative: 4 % — ABNORMAL LOW (ref 12–46)
Lymphs Abs: 0.4 10*3/uL — ABNORMAL LOW (ref 0.7–4.0)
MCH: 26.1 pg (ref 26.0–34.0)
MCHC: 32.8 g/dL (ref 30.0–36.0)
MCV: 79.5 fL (ref 78.0–100.0)
MONOS PCT: 4 % (ref 3–12)
Monocytes Absolute: 0.4 10*3/uL (ref 0.1–1.0)
Neutro Abs: 7.6 10*3/uL (ref 1.7–7.7)
Neutrophils Relative %: 91 % — ABNORMAL HIGH (ref 43–77)
PLATELETS: 240 10*3/uL (ref 150–400)
RBC: 5.33 MIL/uL (ref 4.22–5.81)
RDW: 14.2 % (ref 11.5–15.5)
WBC: 8.4 10*3/uL (ref 4.0–10.5)

## 2014-07-18 LAB — COMPREHENSIVE METABOLIC PANEL
ALBUMIN: 3.9 g/dL (ref 3.5–5.0)
ALK PHOS: 78 U/L (ref 38–126)
ALT: 19 U/L (ref 17–63)
AST: 17 U/L (ref 15–41)
Anion gap: 10 (ref 5–15)
BILIRUBIN TOTAL: 2.6 mg/dL — AB (ref 0.3–1.2)
BUN: 15 mg/dL (ref 6–20)
CHLORIDE: 101 mmol/L (ref 101–111)
CO2: 25 mmol/L (ref 22–32)
Calcium: 8.6 mg/dL — ABNORMAL LOW (ref 8.9–10.3)
Creatinine, Ser: 0.91 mg/dL (ref 0.61–1.24)
GFR calc Af Amer: >60 mL/min (ref >60)
GFR calc non Af Amer: >60 mL/min (ref >60)
Glucose, Bld: 304 mg/dL — ABNORMAL HIGH (ref 65–99)
Potassium: 4 mmol/L (ref 3.5–5.1)
Sodium: 136 mmol/L (ref 135–145)
Total Protein: 7.4 g/dL (ref 6.5–8.1)

## 2014-07-18 LAB — URINE MICROSCOPIC-ADD ON

## 2014-07-18 LAB — URINALYSIS, ROUTINE W REFLEX MICROSCOPIC
Bilirubin Urine: NEGATIVE
Glucose, UA: >1000 mg/dL — AB
Hgb urine dipstick: NEGATIVE
KETONES UR: NEGATIVE mg/dL
Leukocytes, UA: NEGATIVE
Nitrite: NEGATIVE
PROTEIN: NEGATIVE mg/dL
SPECIFIC GRAVITY, URINE: 1.041 — AB (ref 1.005–1.030)
Urobilinogen, UA: 1 mg/dL (ref 0.0–1.0)
pH: 6 (ref 5.0–8.0)

## 2014-07-18 LAB — LIPASE, BLOOD: Lipase: 75 U/L — ABNORMAL HIGH (ref 22–51)

## 2014-07-18 LAB — CBG MONITORING, ED: GLUCOSE-CAPILLARY: 312 mg/dL — AB (ref 65–99)

## 2014-07-18 LAB — I-STAT TROPONIN, ED
TROPONIN I, POC: 0 ng/mL (ref 0.00–0.08)
TROPONIN I, POC: 0 ng/mL (ref 0.00–0.08)

## 2014-07-18 MED ORDER — ONDANSETRON HCL 4 MG/2ML IJ SOLN
4.0000 mg | Freq: Once | INTRAMUSCULAR | Status: AC
  Administered 2014-07-18: 4 mg via INTRAVENOUS
  Filled 2014-07-18: qty 2

## 2014-07-18 MED ORDER — AMOXICILLIN-POT CLAVULANATE 875-125 MG PO TABS: 1.0000 | ORAL_TABLET | Freq: Two times a day (BID) | ORAL | Status: DC

## 2014-07-18 MED ORDER — SODIUM CHLORIDE 0.9 % IV BOLUS (SEPSIS)
1000.0000 mL | Freq: Once | INTRAVENOUS | Status: AC
  Administered 2014-07-18: 1000 mL via INTRAVENOUS

## 2014-07-18 NOTE — ED Notes (Signed)
CBG : 312 

## 2014-07-18 NOTE — ED Notes (Signed)
Per pt, states B/L leg pain since last night-history of DMII

## 2014-07-18 NOTE — ED Notes (Signed)
Unable to urinate at this time.  

## 2014-07-18 NOTE — ED Notes (Signed)
Bed: WA07 Expected date:  Expected time:  Means of arrival:  Comments: Hold for triage 

## 2014-07-18 NOTE — ED Notes (Signed)
Made a request for a urine sample,pt unable to provide one at this time.

## 2014-07-18 NOTE — Discharge Instructions (Signed)
You've been seen and evaluated for your low back pain, and tingling in your legs.  While in the ER he also experienced some chest pain, shortness of breath and nausea and vomiting, which were also worked up. He had a normal chest x-ray and normal cardiac enzymes.  While you're with this he also developed a fever to 100.9.  This may or may not be related to the vomiting you had, as a possible viral gastroenteritis.  Please return to the ER with increased or uncontrollable vomiting, or fever and chills that are not relieved with tylenol, or any increasing symptoms in your legs or back.   Diabetic Neuropathy Diabetic neuropathy is a nerve disease or nerve damage that is caused by diabetes mellitus. About half of all people with diabetes mellitus have some form of nerve damage. Nerve damage is more common in those who have had diabetes mellitus for many years and who generally have not had good control of their blood sugar (glucose) level. Diabetic neuropathy is a common complication of diabetes mellitus. There are three more common types of diabetic neuropathy and a fourth type that is less common and less understood:   Peripheral neuropathy--This is the most common type of diabetic neuropathy. It causes damage to the nerves of the feet and legs first and then eventually the hands and arms.The damage affects the ability to sense touch.  Autonomic neuropathy--This type causes damage to the autonomic nervous system, which controls the following functions:  Heartbeat.  Body temperature.  Blood pressure.  Urination.  Digestion.  Sweating.  Sexual function.  Focal neuropathy--Focal neuropathy can be painful and unpredictable and occurs most often in older adults with diabetes mellitus. It involves a specific nerve or one area and often comes on suddenly. It usually does not cause long-term problems.  Radiculoplexus neuropathy-- Sometimes called lumbosacral radiculoplexus neuropathy, radiculoplexus  neuropathy affects the nerves of the thighs, hips, buttocks, or legs. It is more common in people with type 2 diabetes mellitus and in older men. It is characterized by debilitating pain, weakness, and atrophy, usually in the thigh muscles. CAUSES  The cause of peripheral, autonomic, and focal neuropathies is diabetes mellitus that is uncontrolled and high glucose levels. The cause of radiculoplexus neuropathy is unknown. However, it is thought to be caused by inflammation related to uncontrolled glucose levels. SIGNS AND SYMPTOMS  Peripheral Neuropathy Peripheral neuropathy develops slowly over time. When the nerves of the feet and legs no longer work there may be:   Burning, stabbing, or aching pain in the legs or feet.  Inability to feel pressure or pain in your feet. This can lead to:  Thick calluses over pressure areas.  Pressure sores.  Ulcers.  Foot deformities.  Reduced ability to feel temperature changes.  Muscle weakness. Autonomic Neuropathy The symptoms of autonomic neuropathy vary depending on which nerves are affected. Symptoms may include:  Problems with digestion, such as:  Feeling sick to your stomach (nausea).  Vomiting.  Bloating.  Constipation.  Diarrhea.  Abdominal pain.  Difficulty with urination. This occurs if you lose your ability to sense when your bladder is full. Problems include:  Urine leakage (incontinence).  Inability to empty your bladder completely (retention).  Rapid or irregular heartbeat (palpitations).  Blood pressure drops when you stand up (orthostatic hypotension). When you stand up you may feel:  Dizzy.  Weak.  Faint.  In men, inability to attain and maintain an erection.  In women, vaginal dryness and problems with decreased sexual desire and  arousal.  Problems with body temperature regulation.  Increased or decreased sweating. Focal Neuropathy  Abnormal eye movements or abnormal alignment of both  eyes.  Weakness in the wrist.  Foot drop. This results in an inability to lift the foot properly and abnormal walking or foot movement.  Paralysis on one side of your face (Bell palsy).  Chest or abdominal pain. Radiculoplexus Neuropathy  Sudden, severe pain in your hip, thigh, or buttocks.  Weakness and wasting of thigh muscles.  Difficulty rising from a seated position.  Abdominal swelling.  Unexplained weight loss (usually more than 10 lb [4.5 kg]). DIAGNOSIS  Peripheral Neuropathy Your senses may be tested. Sensory function testing can be done with:  A light touch using a monofilament.  A vibration with tuning fork.  A sharp sensation with a pin prick. Other tests that can help diagnose neuropathy are:  Nerve conduction velocity. This test checks the transmission of an electrical current through a nerve.  Electromyography. This shows how muscles respond to electrical signals transmitted by nearby nerves.  Quantitative sensory testing. This is used to assess how your nerves respond to vibrations and changes in temperature. Autonomic Neuropathy Diagnosis is often based on reported symptoms. Tell your health care provider if you experience:   Dizziness.   Constipation.   Diarrhea.   Inappropriate urination or inability to urinate.   Inability to get or maintain an erection.  Tests that may be done include:   Electrocardiography or Holter monitor. These are tests that can help show problems with the heart rate or heart rhythm.   An X-ray exam may be done. Focal Neuropathy Diagnosis is made based on your symptoms and what your health care provider finds during your exam. Other tests may be done. They may include:  Nerve conduction velocities. This checks the transmission of electrical current through a nerve.  Electromyography. This shows how muscles respond to electrical signals transmitted by nearby nerves.  Quantitative sensory testing. This test is  used to assess how your nerves respond to vibration and changes in temperature. Radiculoplexus Neuropathy  Often the first thing is to eliminate any other issue or problems that might be the cause, as there is no stick test for diagnosis.  X-ray exam of your spine and lumbar region.  Spinal tap to rule out cancer.  MRI to rule out other lesions. TREATMENT  Once nerve damage occurs, it cannot be reversed. The goal of treatment is to keep the disease or nerve damage from getting worse and affecting more nerve fibers. Controlling your blood glucose level is the key. Most people with radiculoplexus neuropathy see at least a partial improvement over time. You will need to keep your blood glucose and HbA1c levels in the target range determined by your health care provider. Things that help control blood glucose levels include:   Blood glucose monitoring.   Meal planning.   Physical activity.   Diabetes medicine.  Over time, maintaining lower blood glucose levels helps lessen symptoms. Sometimes, prescription pain medicine is needed. HOME CARE INSTRUCTIONS:  Do not smoke.  Keep your blood glucose level in the range that you and your health care provider have determined acceptable for you.  Keep your blood pressure level in the range that you and your health care provider have determined acceptable for you.  Eat a well-balanced diet.  Be active every day.  Check your feet every day. SEEK MEDICAL CARE IF:   You have burning, stabbing, or aching pain in the legs or  feet.  You are unable to feel pressure or pain in your feet.  You develop problems with digestion such as:  Nausea.  Vomiting.  Bloating.  Constipation.  Diarrhea.  Abdominal pain.  You have difficulty with urination, such as:  Incontinence.  Retention.  You have palpitations.  You develop orthostatic hypotension. When you stand up you may feel:  Dizzy.  Weak.  Faint.  You cannot attain and  maintain an erection (in men).  You have vaginal dryness and problems with decreased sexual desire and arousal (in women).  You have severe pain in your thighs, legs, or buttocks.  You have unexplained weight loss. Document Released: 03/17/2001 Document Revised: 10/27/2012 Document Reviewed: 06/17/2012 [facility] Patient Information 2015 [place], [place]. This information is not intended to replace advice given to you by your health care provider. Make sure you discuss any questions you have with your health care provider.  Hyperglycemia Hyperglycemia occurs when the glucose (sugar) in your blood is too high. Hyperglycemia can happen for many reasons, but it most often happens to people who do not know they have diabetes or are not managing their diabetes properly.  CAUSES  Whether you have diabetes or not, there are other causes of hyperglycemia. Hyperglycemia can occur when you have diabetes, but it can also occur in other situations that you might not be as aware of, such as: Diabetes  If you have diabetes and are having problems controlling your blood glucose, hyperglycemia could occur because of some of the following reasons:  Not following your meal plan.  Not taking your diabetes medications or not taking it properly.  Exercising less or doing less activity than you normally do.  Being sick. Pre-diabetes  This cannot be ignored. Before people develop Type 2 diabetes, they almost always have "pre-diabetes." This is when your blood glucose levels are higher than normal, but not yet high enough to be diagnosed as diabetes. Research has shown that some long-term damage to the body, especially the heart and circulatory system, may already be occurring during pre-diabetes. If you take action to manage your blood glucose when you have pre-diabetes, you may delay or prevent Type 2 diabetes from developing. Stress  If you have diabetes, you may be "diet" controlled or on oral medications or  insulin to control your diabetes. However, you may find that your blood glucose is higher than usual in the hospital whether you have diabetes or not. This is often referred to as "stress hyperglycemia." Stress can elevate your blood glucose. This happens because of hormones put out by the body during times of stress. If stress has been the cause of your high blood glucose, it can be followed regularly by your caregiver. That way he/she can make sure your hyperglycemia does not continue to get worse or progress to diabetes. Steroids  Steroids are medications that act on the infection fighting system (immune system) to block inflammation or infection. One side effect can be a rise in blood glucose. Most people can produce enough extra insulin to allow for this rise, but for those who cannot, steroids make blood glucose levels go even higher. It is not unusual for steroid treatments to "uncover" diabetes that is developing. It is not always possible to determine if the hyperglycemia will go away after the steroids are stopped. A special blood test called an A1c is sometimes done to determine if your blood glucose was elevated before the steroids were started. SYMPTOMS  Thirsty.  Frequent urination.  Dry mouth.  Blurred vision.  Tired or fatigue.  Weakness.  Sleepy.  Tingling in feet or leg. DIAGNOSIS  Diagnosis is made by monitoring blood glucose in one or all of the following ways:  A1c test. This is a chemical found in your blood.  Fingerstick blood glucose monitoring.  Laboratory results. TREATMENT  First, knowing the cause of the hyperglycemia is important before the hyperglycemia can be treated. Treatment may include, but is not be limited to:  Education.  Change or adjustment in medications.  Change or adjustment in meal plan.  Treatment for an illness, infection, etc.  More frequent blood glucose monitoring.  Change in exercise plan.  Decreasing or stopping  steroids.  Lifestyle changes. HOME CARE INSTRUCTIONS   Test your blood glucose as directed.  Exercise regularly. Your caregiver will give you instructions about exercise. Pre-diabetes or diabetes which comes on with stress is helped by exercising.  Eat wholesome, balanced meals. Eat often and at regular, fixed times. Your caregiver or nutritionist will give you a meal plan to guide your sugar intake.  Being at an ideal weight is important. If needed, losing as little as 10 to 15 pounds may help improve blood glucose levels. SEEK MEDICAL CARE IF:   You have questions about medicine, activity, or diet.  You continue to have symptoms (problems such as increased thirst, urination, or weight gain). SEEK IMMEDIATE MEDICAL CARE IF:   You are vomiting or have diarrhea.  Your breath smells fruity.  You are breathing faster or slower.  You are very sleepy or incoherent.  You have numbness, tingling, or pain in your feet or hands.  You have chest pain.  Your symptoms get worse even though you have been following your caregiver's orders.  If you have any other questions or concerns. Document Released: 07/02/2000 Document Revised: 03/31/2011 Document Reviewed: 05/05/2011 [facility] Patient Information 2015 [place], [place]. This information is not intended to replace advice given to you by your health care provider. Make sure you discuss any questions you have with your health care provider.  Viral Gastroenteritis Viral gastroenteritis is also known as stomach flu. This condition affects the stomach and intestinal tract. It can cause sudden diarrhea and vomiting. The illness typically lasts 3 to 8 days. Most people develop an immune response that eventually gets rid of the virus. While this natural response develops, the virus can make you quite ill. CAUSES  Many different viruses can cause gastroenteritis, such as rotavirus or noroviruses. You can catch one of these viruses by consuming  contaminated food or water. You may also catch a virus by sharing utensils or other personal items with an infected person or by touching a contaminated surface. SYMPTOMS  The most common symptoms are diarrhea and vomiting. These problems can cause a severe loss of body fluids (dehydration) and a body salt (electrolyte) imbalance. Other symptoms may include:  Fever.  Headache.  Fatigue.  Abdominal pain. DIAGNOSIS  Your caregiver can usually diagnose viral gastroenteritis based on your symptoms and a physical exam. A stool sample may also be taken to test for the presence of viruses or other infections. TREATMENT  This illness typically goes away on its own. Treatments are aimed at rehydration. The most serious cases of viral gastroenteritis involve vomiting so severely that you are not able to keep fluids down. In these cases, fluids must be given through an intravenous line (IV). HOME CARE INSTRUCTIONS   Drink enough fluids to keep your urine clear or pale yellow. Drink small amounts of  fluids frequently and increase the amounts as tolerated.  Ask your caregiver for specific rehydration instructions.  Avoid:  Foods high in sugar.  Alcohol.  Carbonated drinks.  Tobacco.  Juice.  Caffeine drinks.  Extremely hot or cold fluids.  Fatty, greasy foods.  Too much intake of anything at one time.  Dairy products until 24 to 48 hours after diarrhea stops.  You may consume probiotics. Probiotics are active cultures of beneficial bacteria. They may lessen the amount and number of diarrheal stools in adults. Probiotics can be found in yogurt with active cultures and in supplements.  Wash your hands well to avoid spreading the virus.  Only take over-the-counter or prescription medicines for pain, discomfort, or fever as directed by your caregiver. Do not give aspirin to children. Antidiarrheal medicines are not recommended.  Ask your caregiver if you should continue to take your  regular prescribed and over-the-counter medicines.  Keep all follow-up appointments as directed by your caregiver. SEEK IMMEDIATE MEDICAL CARE IF:   You are unable to keep fluids down.  You do not urinate at least once every 6 to 8 hours.  You develop shortness of breath.  You notice blood in your stool or vomit. This may look like coffee grounds.  You have abdominal pain that increases or is concentrated in one small area (localized).  You have persistent vomiting or diarrhea.  You have a fever.  The patient is a child younger than 3 months, and he or she has a fever.  The patient is a child older than 3 months, and he or she has a fever and persistent symptoms.  The patient is a child older than 3 months, and he or she has a fever and symptoms suddenly get worse.  The patient is a baby, and he or she has no tears when crying. MAKE SURE YOU:   Understand these instructions.  Will watch your condition.  Will get help right away if you are not doing well or get worse. Document Released: 01/06/2005 Document Revised: 03/31/2011 Document Reviewed: 10/23/2010 [facility] Patient Information 2015 [place]. This information is not intended to replace advice given to you by your health care provider. Make sure you discuss any questions you have with your health care provider.

## 2014-07-18 NOTE — ED Provider Notes (Signed)
CSN: [id number]     Arrival date & time 07/18/14  4098 History   First MD Initiated Contact with Patient 07/18/14 1000     Chief Complaint  Patient presents with  . Leg Pain    x 12 hours  . Nausea  . Emesis  . Hyperglycemia    CBG 312     (Consider location/radiation/quality/duration/timing/severity/associated sxs/prior Treatment) HPI   [name] is a 39 year old male with diabetes, hypertension, obstructive sleep apnea, obesity, presents to the emergency department this morning after experiencing medial thigh numbness and tingling, which felt like "pins and needles".  It began last night around midnight, he was awake in bed, when he felt the pins and needles on his inner thighs.  The sensation spread down his legs (proximal to distal), and then slowly went away.  He did not have any muscle cramps, spasms, pain, rash or swelling.  He was able to go to bed without any further difficulty.  Today he has felt generally fatigued, especially in his legs, which have given out on him several times.  He denies any leg pain, muscle cramping or spasming, and has no claudication symptoms and no sciatica sx.  Roughly three days ago he had some low back pain without any trauma, denies any radiating back pain.  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  He denies any loss of bladder or bowel function. Patient denies any injected drug use, denies any personal or family history of cancer.  He is having issues with erectile dysfunction, but does not complain of any numbness of his genitals or rectum.  Denies any urinary sx, does not have difficulty initiating, stopping or maintaining a urine stream.  His sugars were high this morning, around 300, and they normally are in the high 100s.  He came to the ER to be evaluated, and while in the ER triage experienced some squeezing chest pain, shortness of breath, diaphoresis  and nausea, and then had one episode of vomiting. They ate a large amount of seafood last night, states he vomited up his mail from last night.  No one else who ate with him was sick.  He denies any nausea prior to this vomiting episode.  He has not had any fevers, chills, sweats, or abdominal pain. He only uses metformin to control his diabetes.  He had success with Invokana for a few months, but was unable to afford it.  Then he had a recent severe reaction to Bydureon, which gave him acute pancreatitis.     Past Medical History  Diagnosis Date  . DIABETES MELLITUS, TYPE II, UNCONTROLLED 01/02/2010  . ELEVATED BLOOD PRESSURE 12/31/2009  . Hypertension   . Pancreatitis 09/13/2013  . Sleep apnea    Past Surgical History  Procedure Laterality Date  . Wisdom tooth extraction     Family History  Problem Relation Age of Onset  . Diabetes Father   . Heart disease Mother   . Heart disease Father   . Allergies Mother   . Emphysema Father   . Sarcoidosis Father   . Lung cancer Mother    History  Substance Use Topics  . Smoking status: Never Smoker   . Smokeless tobacco: Never Used  . Alcohol Use: Yes     Comment: social--2-3 glasses wine occassionally    Review of Systems  Constitutional: Negative.   HENT: Negative.   Eyes: Negative.  Respiratory: Negative for cough, chest tightness, wheezing and stridor.   Cardiovascular: Negative for palpitations and leg swelling.  Gastrointestinal: Negative for nausea, abdominal pain, diarrhea, constipation, blood in stool and abdominal distention.  Endocrine: Negative for polydipsia, polyphagia and polyuria.  Genitourinary: Negative for dysuria, urgency, frequency, hematuria, flank pain, decreased urine volume, discharge, penile swelling, scrotal swelling, difficulty urinating, penile pain and testicular pain.  Musculoskeletal: Positive for gait problem. Negative for myalgias, joint swelling, arthralgias, neck pain and neck stiffness.  Skin:  Negative.   Neurological: Negative for dizziness, tremors, syncope, light-headedness and headaches.   Allergies  Bydureon; Lipitor; and Lisinopril  Home Medications   Prior to Admission medications   Medication Sig Start Date End Date Taking? Authorizing Provider  Chlorpheniramine-PSE-Ibuprofen (ADVIL ALLERGY SINUS) 2-30-200 MG TABS Take 2 tablets by mouth 2 (two) times daily as needed (allergies).   Yes Historical Provider, MD  fluticasone (FLONASE) 50 MCG/ACT nasal spray Place 2 sprays into both nostrils daily as needed for allergies or rhinitis.   Yes Historical Provider, MD  metFORMIN (GLUCOPHAGE) 500 MG tablet TAKE 2 TABLETS TWICE A DAY 03/02/14  Yes Kristian Covey, MD  nystatin cream (MYCOSTATIN) Apply 1 application topically 2 (two) times daily. For a week 11/11/13  Yes Kristian Covey, MD  omeprazole (PRILOSEC) 40 MG capsule TAKE ONE CAPSULE BY MOUTH EVERY DAY Patient taking differently: TAKE ONE CAPSULE BY MOUTH EVERY DAY PRN ACID REFLUX 01/23/14  Yes Kristian Covey, MD  amLODipine (NORVASC) 5 MG tablet Take 1 tablet (5 mg total) by mouth daily. Patient not taking: Reported on 07/18/2014 09/15/13 09/15/14  Demetrio Lapping, PA-C  Testosterone 40.5 MG/2.5GM (1.62%) GEL 1 spray per arm once daily. Patient not taking: Reported on 07/18/2014 05/23/14   Kristian Covey, MD   BP 124/68 mmHg  Pulse 100  Temp(Src) 100.1 F (37.8 C) (Oral)  Resp 29  SpO2 96% Physical Exam  Constitutional: He is oriented to person, place, and time. He appears well-developed and well-nourished. No distress.  Obese male, appears stated age, NAD, non-toxic appearing  HENT:  Head: Normocephalic and atraumatic.  Right Ear: External ear normal.  Left Ear: External ear normal.  Nose: Nose normal.  Mouth/Throat: Oropharynx is clear and moist. No oropharyngeal exudate.  Eyes: Conjunctivae and EOM are normal. Pupils are equal, round, and reactive to light. Right eye exhibits no discharge. Left eye exhibits no  discharge. No scleral icterus.  Neck: Normal range of motion. No JVD present. No tracheal deviation present. No thyromegaly present.  Cardiovascular: Normal rate, regular rhythm, normal heart sounds and intact distal pulses.  Exam reveals no gallop and no friction rub.   No murmur heard. Pulmonary/Chest: Effort normal and breath sounds normal. No respiratory distress. He has no wheezes. He has no rales. He exhibits no tenderness.  Abdominal: Soft. Normal appearance and bowel sounds are normal. He exhibits no distension and no mass. There is no tenderness. There is no rigidity, no rebound, no guarding, no CVA tenderness, no tenderness at McBurney's point and negative Murphy's sign. No hernia.  Obese abdomen, not distended not ttp  Genitourinary: Rectum normal and prostate normal. Rectal exam shows no external hemorrhoid, no internal hemorrhoid and no tenderness. Prostate is not enlarged and not tender.  Musculoskeletal: Normal range of motion. He exhibits no edema.       Cervical back: He exhibits normal range of motion, no tenderness and no bony tenderness.       Thoracic back: He exhibits normal range of  motion, no tenderness and no bony tenderness.       Lumbar back: He exhibits tenderness. He exhibits normal range of motion.  Lumbar paraspinals and lumbar spine tender to palpation Normal ROM and strength of bilateral hips, flexion, extension, abduction and adduction, knee, ankle.  Normal dorsiflexion and plantarflexion. Negative straight leg raise  Lymphadenopathy:    He has no cervical adenopathy.  Neurological: He is alert and oriented to person, place, and time. He has normal strength and normal reflexes. He is not disoriented. He displays no atrophy and no tremor. No cranial nerve deficit or sensory deficit. He exhibits normal muscle tone. He displays no seizure activity. Coordination and gait normal.  Motor:  Normal tone. 5/5 in upper and lower extremities bilaterally including strong and  equal grip strength and dorsiflexion/plantar flexion Sensory: Pinprick and light touch normal in all extremities, sharp/dull normal in LE and bilateral toes and feet. Deep Tendon Reflexes: 1+ and symmetric in the brachioradialis and patella Cerebellar: normal finger-to-nose with bilateral upper extremities Gait: normal gait and balance CV: distal pulses palpable throughout     Skin: Skin is warm and dry. No rash noted. No erythema. No pallor.  Psychiatric: He has a normal mood and affect. His behavior is normal. Judgment and thought content normal.  Nursing note and vitals reviewed.   ED Course  Procedures (including critical care time) Labs Review Labs Reviewed  CBC WITH DIFFERENTIAL/PLATELET - Abnormal; Notable for the following:    Neutrophils Relative % 91 (*)    Lymphocytes Relative 4 (*)    Lymphs Abs 0.4 (*)    All other components within normal limits  COMPREHENSIVE METABOLIC PANEL - Abnormal; Notable for the following:    Glucose, Bld 304 (*)    Calcium 8.6 (*)    Total Bilirubin 2.6 (*)    All other components within normal limits  URINALYSIS, ROUTINE W REFLEX MICROSCOPIC (NOT AT [facility]) - Abnormal; Notable for the following:    Color, Urine AMBER (*)    APPearance CLOUDY (*)    Specific Gravity, Urine 1.041 (*)    Glucose, UA >1000 (*)    All other components within normal limits  LIPASE, BLOOD - Abnormal; Notable for the following:    Lipase 75 (*)    All other components within normal limits  CBG MONITORING, ED - Abnormal; Notable for the following:    Glucose-Capillary 312 (*)    All other components within normal limits  URINE MICROSCOPIC-ADD ON  Rosezena Sensor, ED  Imaging Review Dg Chest 2 View  07/18/2014   CLINICAL DATA:  Chest pain today.  EXAM: CHEST  2 VIEW  COMPARISON:  None.  FINDINGS: The heart size and mediastinal contours are within normal limits. Both lungs are clear. No pneumothorax or pleural effusion is noted. The visualized skeletal  structures are unremarkable.  IMPRESSION: No active cardiopulmonary disease.   Electronically Signed   By: Lupita Raider, M.D.   On: 07/18/2014 12:17     EKG Interpretation None      MDM   Final diagnoses:  Chest pain   Leg tingling "pins and needles" with chest pain, shortness of breath, nausea and vomiting here in the ED. With recent history of low back pain, will do digital rectal exam to evaluate sphincter tone and rule out saddle numbness/ cauda equina.  Pt's description of CP, N, V, SOB concerning for cardiac pathology, and given pt's multiple risk factors, will do a CP work up to r/o cardiac event.  Labs pertinent for hyperglycemia of 312, glucosuria, elevated lipase - although an improvement from his recent pancreatitis   2:23 PM EKG shows normal sinus rhythm without ST elevation, initial troponin is negative.  Chest x-ray is negative.  Patient has normal gait without any instability.  He vaguely describes his leg symptoms by stating "it just doesn't feel right" or "it is just not normal".  All LE motor, strength and sensation by my exam is normal.  DRE revealed mildly weakened rectal tone when asked to bear down. Dr. Littie DeedsGentry has seen and evaluated the patient.  He has no concern for any low back pathology - please see his documentation for any additional findings. Will get delta troponin at 4 PM  5:17 PM Patient's delta troponin is negative  Patient has developed a fever while he's been here in the ER, 100.9 oral. Patient does not feel feverish, and is not complaining of any pain at this time. I have discussed his negative results with him today and patient is agreeable to discharging at this time. I have also explained to him return precautions.  He agrees to return to the ER with any increasing/uncontrollable vomiting, with change in lower extremity sensation or strength, or any other concerning signs/sx of infection.  He is well appearing, in no distress and denies any chest  pain, abdominal pain, numbness, tingling, back pain or weakness at this time.  He has ambulated here in the ER without difficulty.  He has normal sensation bilaterally of his legs and feet, normal strength, and no back pain illicited with movement of his back or with manipulation of his hips.   At this time I do not have a source for his fever, CXR neg, UA neg, and he has no abdominal pain, not ttp.  It may be related to his episode of vomiting.  I have discussed this with the patient. He states that he feels better than he did when he first got here.  He does not feel feverish.  He states that he's comfortable going home, and he has verbally agreed to our return precautions.  These instructions were also provided for him in his print out.        Danelle BerryLeisa Desirea Mizrahi, PA-C 07/19/14 2145  Mirian MoMatthew Gentry, MD 07/20/14 [phone]

## 2014-07-18 NOTE — ED Notes (Signed)
Pt aware urine sample needed 

## 2014-08-02 ENCOUNTER — Ambulatory Visit: Payer: BLUE CROSS/BLUE SHIELD | Admitting: Family Medicine

## 2014-08-23 ENCOUNTER — Ambulatory Visit (INDEPENDENT_AMBULATORY_CARE_PROVIDER_SITE_OTHER): Payer: BLUE CROSS/BLUE SHIELD | Admitting: Family Medicine

## 2014-08-23 ENCOUNTER — Encounter: Payer: Self-pay | Admitting: Family Medicine

## 2014-08-23 VITALS — BP 128/80 | HR 86 | Temp 98.5°F | Wt 331.0 lb

## 2014-08-23 DIAGNOSIS — E1165 Type 2 diabetes mellitus with hyperglycemia: Secondary | ICD-10-CM | POA: Diagnosis not present

## 2014-08-23 DIAGNOSIS — IMO0002 Reserved for concepts with insufficient information to code with codable children: Secondary | ICD-10-CM

## 2014-08-23 LAB — HEMOGLOBIN A1C: Hgb A1c MFr Bld: 9.3 % — ABNORMAL HIGH (ref 4.6–6.5)

## 2014-08-23 NOTE — Progress Notes (Signed)
Pre visit review using our clinic review tool, if applicable. No additional management support is needed unless otherwise documented below in the visit note. 

## 2014-08-23 NOTE — Patient Instructions (Signed)
Continue weight loss and exercise efforts. Set up to see diabetes educator

## 2014-08-23 NOTE — Progress Notes (Signed)
   Subjective:    Patient ID: Gabriel Waters, male    DOB: [birth year], 39 y.o.   MRN: [id number]  HPI Follow-up type 2 diabetes. History of poor control. He has dropped 6 pounds since last visit and start exercising more. He is not yet set up to see diabetes educator because of scheduling conflicts. He had one episode back in January presented to the emergency department with multiple symptoms and had elevated blood sugar over 300. He states this was atypical. He was tried on S GLT 2 class but had yeast infection and was tried on GLP-1 inhibitor but had pancreatitis. He has been reluctant to consider insulin. Currently takes metformin. Needs a new home glucose monitor  Past Medical History  Diagnosis Date  . DIABETES MELLITUS, TYPE II, UNCONTROLLED 01/02/2010  . ELEVATED BLOOD PRESSURE 12/31/2009  . Hypertension   . Pancreatitis 09/13/2013  . Sleep apnea    Past Surgical History  Procedure Laterality Date  . Wisdom tooth extraction      reports that he has never smoked. He has never used smokeless tobacco. He reports that he drinks alcohol. He reports that he does not use illicit drugs. family history includes Allergies in his mother; Diabetes in his father; Emphysema in his father; Heart disease in his father and mother; Lung cancer in his mother; Sarcoidosis in his father. Allergies  Allergen Reactions  . Bydureon [Exenatide] Other (See Comments)    pancreatitis  . Lipitor [Atorvastatin] Other (See Comments)    Arthralgias.  . Lisinopril Other (See Comments)    pancreatitis      Review of Systems  Constitutional: Negative for fatigue.  Eyes: Negative for visual disturbance.  Respiratory: Negative for cough, chest tightness and shortness of breath.   Cardiovascular: Negative for chest pain, palpitations and leg swelling.  Endocrine: Negative for polydipsia and polyuria.  Neurological: Negative for dizziness, syncope, weakness, light-headedness and headaches.       Objective:     Physical Exam  Constitutional: He appears well-developed and well-nourished.  Cardiovascular: Normal rate and regular rhythm.   Pulmonary/Chest: Effort normal and breath sounds normal. No respiratory distress. He has no wheezes. He has no rales.  Musculoskeletal: He exhibits no edema.          Assessment & Plan:  Type 2 diabetes. History of poor control. Recheck A1c. If still elevated consider addition of DPP 4 inhibitor-though we explained this class of medication would likely only bring his A1c down about 0.8 at best. He needs to continue weight loss and exercise efforts. New glucose monitor given. Strongly encouraged to set up to see diabetes educator. Reassess 3 months

## 2014-09-07 ENCOUNTER — Other Ambulatory Visit: Payer: Self-pay

## 2014-09-07 MED ORDER — SITAGLIPTIN PHOSPHATE 100 MG PO TABS
100.0000 mg | ORAL_TABLET | Freq: Every day | ORAL | Status: DC
Start: 2014-09-07 — End: 2017-03-31

## 2014-11-03 ENCOUNTER — Other Ambulatory Visit: Payer: Self-pay | Admitting: Family Medicine

## 2014-12-24 ENCOUNTER — Encounter (HOSPITAL_COMMUNITY): Payer: Self-pay | Admitting: Emergency Medicine

## 2014-12-24 ENCOUNTER — Emergency Department (HOSPITAL_COMMUNITY)
Admission: EM | Admit: 2014-12-24 | Discharge: 2014-12-24 | Disposition: A | Discharge status: Home / Self Care | Payer: BLUE CROSS/BLUE SHIELD | Source: Home / Self Care

## 2014-12-24 DIAGNOSIS — J069 Acute upper respiratory infection, unspecified: Secondary | ICD-10-CM | POA: Diagnosis not present

## 2014-12-24 MED ORDER — AMOXICILLIN 250 MG PO CAPS: 250.0000 mg | ORAL_CAPSULE | Freq: Three times a day (TID) | ORAL | Status: DC

## 2014-12-24 NOTE — Discharge Instructions (Signed)
Upper Respiratory Infection, Adult Most upper respiratory infections (URIs) are a viral infection of the air passages leading to the lungs. A URI affects the nose, throat, and upper air passages. The most common type of URI is nasopharyngitis and is typically referred to as "the common cold." URIs run their course and usually go away on their own. Most of the time, a URI does not require medical attention, but sometimes a bacterial infection in the upper airways can follow a viral infection. This is called a secondary infection. Sinus and middle ear infections are common types of secondary upper respiratory infections. Bacterial pneumonia can also complicate a URI. A URI can worsen asthma and chronic obstructive pulmonary disease (COPD). Sometimes, these complications can require emergency medical care and may be life threatening.  CAUSES Almost all URIs are caused by viruses. A virus is a type of germ and can spread from one person to another.  RISKS FACTORS You may be at risk for a URI if:   You smoke.   You have chronic heart or lung disease.  You have a weakened defense (immune) system.   You are very young or very old.   You have nasal allergies or asthma.  You work in crowded or poorly ventilated areas.  You work in health care facilities or schools. SIGNS AND SYMPTOMS  Symptoms typically develop 2-3 days after you come in contact with a cold virus. Most viral URIs last 7-10 days. However, viral URIs from the influenza virus (flu virus) can last 14-18 days and are typically more severe. Symptoms may include:   Runny or stuffy (congested) nose.   Sneezing.   Cough.   Sore throat.   Headache.   Fatigue.   Fever.   Loss of appetite.   Pain in your forehead, behind your eyes, and over your cheekbones (sinus pain).  Muscle aches.  DIAGNOSIS  Your health care provider may diagnose a URI by:  Physical exam.  Tests to check that your symptoms are not due to  another condition such as:  Strep throat.  Sinusitis.  Pneumonia.  Asthma. TREATMENT  A URI goes away on its own with time. It cannot be cured with medicines, but medicines may be prescribed or recommended to relieve symptoms. Medicines may help:  Reduce your fever.  Reduce your cough.  Relieve nasal congestion. HOME CARE INSTRUCTIONS   Take medicines only as directed by your health care provider.   Gargle warm saltwater or take cough drops to comfort your throat as directed by your health care provider.  Use a warm mist humidifier or inhale steam from a shower to increase air moisture. This may make it easier to breathe.  Drink enough fluid to keep your urine clear or pale yellow.   Eat soups and other clear broths and maintain good nutrition.   Rest as needed.   Return to work when your temperature has returned to normal or as your health care provider advises. You may need to stay home longer to avoid infecting others. You can also use a face mask and careful hand washing to prevent spread of the virus.  Increase the usage of your inhaler if you have asthma.   Do not use any tobacco products, including cigarettes, chewing tobacco, or electronic cigarettes. If you need help quitting, ask your health care provider. PREVENTION  The best way to protect yourself from getting a cold is to practice good hygiene.   Avoid oral or hand contact with people with cold   symptoms.   Wash your hands often if contact occurs.  There is no clear evidence that vitamin C, vitamin E, echinacea, or exercise reduces the chance of developing a cold. However, it is always recommended to get plenty of rest, exercise, and practice good nutrition.  SEEK MEDICAL CARE IF:   You are getting worse rather than better.   Your symptoms are not controlled by medicine.   You have chills.  You have worsening shortness of breath.  You have brown or red mucus.  You have yellow or brown nasal  discharge.  You have pain in your face, especially when you bend forward.  You have a fever.  You have swollen neck glands.  You have pain while swallowing.  You have white areas in the back of your throat. SEEK IMMEDIATE MEDICAL CARE IF:   You have severe or persistent:  Headache.  Ear pain.  Sinus pain.  Chest pain.  You have chronic lung disease and any of the following:  Wheezing.  Prolonged cough.  Coughing up blood.  A change in your usual mucus.  You have a stiff neck.  You have changes in your:  Vision.  Hearing.  Thinking.  Mood. MAKE SURE YOU:   Understand these instructions.  Will watch your condition.  Will get help right away if you are not doing well or get worse.   This information is not intended to replace advice given to you by your health care provider. Make sure you discuss any questions you have with your health care provider.   Document Released: 07/02/2000 Document Revised: 05/23/2014 Document Reviewed: 04/13/2013 Elsevier Interactive Patient Education 2016 [employer]  

## 2014-12-24 NOTE — ED Notes (Signed)
Pt here with cough with greenish-yellow phlegm, now moving to chest with pressure No Hx Asthma, Bronchitis No fever,chills,n.v Mucinex effective

## 2014-12-24 NOTE — ED Provider Notes (Signed)
CSN: [id number]     Arrival date & time 12/24/14  1901 History   None    No chief complaint on file.  (Consider location/radiation/quality/duration/timing/severity/associated sxs/prior Treatment) HPI 39 y/o male with 2-3 day history of cold symptoms. Treatment at home for symptoms not working. Using mucinex worse since last night. Girl friend with similar symptoms Past Medical History  Diagnosis Date  . DIABETES MELLITUS, TYPE II, UNCONTROLLED 01/02/2010  . ELEVATED BLOOD PRESSURE 12/31/2009  . Hypertension   . Pancreatitis 09/13/2013  . Sleep apnea    Past Surgical History  Procedure Laterality Date  . Wisdom tooth extraction     Family History  Problem Relation Age of Onset  . Diabetes Father   . Heart disease Mother   . Heart disease Father   . Allergies Mother   . Emphysema Father   . Sarcoidosis Father   . Lung cancer Mother    Social History  Substance Use Topics  . Smoking status: Never Smoker   . Smokeless tobacco: Never Used  . Alcohol Use: Yes     Comment: social--2-3 glasses wine occassionally    Review of Systems +"ve cold sympoms -"ve fever, sputum production, smoker Allergies  Bydureon; Invokana; Lipitor; and Lisinopril  Home Medications   Prior to Admission medications   Medication Sig Start Date End Date Taking? Authorizing Provider  fluticasone (FLONASE) 50 MCG/ACT nasal spray Place 2 sprays into both nostrils daily as needed for allergies or rhinitis.    Historical Provider, MD  lisinopril (PRINIVIL,ZESTRIL) 10 MG tablet Take 10 mg by mouth daily.    Historical Provider, MD  metFORMIN (GLUCOPHAGE) 500 MG tablet TAKE 2 TABLETS TWICE A DAY 11/03/14   Kristian CoveyBruce W Burchette, MD  nystatin cream (MYCOSTATIN) Apply 1 application topically 2 (two) times daily. For a week 11/11/13   Kristian CoveyBruce W Burchette, MD  omeprazole (PRILOSEC) 40 MG capsule TAKE ONE CAPSULE BY MOUTH EVERY DAY Patient taking differently: TAKE ONE CAPSULE BY MOUTH EVERY DAY PRN ACID REFLUX  01/23/14   Kristian CoveyBruce W Burchette, MD  sitaGLIPtin (JANUVIA) 100 MG tablet Take 1 tablet (100 mg total) by mouth daily. 09/07/14   Kristian CoveyBruce W Burchette, MD  Testosterone 40.5 MG/2.5GM (1.62%) GEL 1 spray per arm once daily. Patient not taking: Reported on 07/18/2014 05/23/14   Kristian CoveyBruce W Burchette, MD   Meds Ordered and Administered this Visit  Medications - No data to display  BP 137/95 mmHg  Pulse 86  Temp(Src) 98.7 F (37.1 C) (Oral)  SpO2 97% No data found.   Physical Exam  Constitutional: He is oriented to person, place, and time. He appears well-developed and well-nourished.  HENT:  Head: Normocephalic and atraumatic.  Right Ear: External ear normal.  Left Ear: External ear normal.  Mouth/Throat: Oropharynx is clear and moist.  Neck: Normal range of motion. Neck supple.  Cardiovascular: Normal rate.   Pulmonary/Chest: Effort normal and breath sounds normal.  Musculoskeletal: Normal range of motion.  Neurological: He is alert and oriented to person, place, and time.  Skin: Skin is warm and dry.  Psychiatric: He has a normal mood and affect. His behavior is normal. Judgment and thought content normal.  Nursing note and vitals reviewed.   ED Course  Procedures (including critical care time)  Labs Review Labs Reviewed - No data to display  Imaging Review No results found.   Visual Acuity Review  Right Eye Distance:   Left Eye Distance:   Bilateral Distance:    Right Eye Near:  Left Eye Near:    Bilateral Near:         MDM   1. URI (upper respiratory infection)    Rx for Amoxil Return to work not URI instructioin Discharged by myself Pt acknowledge understanding of instructions.    Tharon Aquas, PA 12/24/14 1932

## 2016-01-28 ENCOUNTER — Encounter (HOSPITAL_COMMUNITY): Payer: Self-pay

## 2016-01-28 ENCOUNTER — Emergency Department (HOSPITAL_COMMUNITY)
Admission: EM | Admit: 2016-01-28 | Discharge: 2016-01-28 | Disposition: A | Discharge status: Home / Self Care | Payer: No Typology Code available for payment source | Attending: Physician Assistant | Admitting: Physician Assistant

## 2016-01-28 DIAGNOSIS — Y92410 Unspecified street and highway as the place of occurrence of the external cause: Secondary | ICD-10-CM | POA: Diagnosis not present

## 2016-01-28 DIAGNOSIS — M62838 Other muscle spasm: Secondary | ICD-10-CM | POA: Diagnosis not present

## 2016-01-28 DIAGNOSIS — Z7984 Long term (current) use of oral hypoglycemic drugs: Secondary | ICD-10-CM | POA: Insufficient documentation

## 2016-01-28 DIAGNOSIS — I10 Essential (primary) hypertension: Secondary | ICD-10-CM | POA: Insufficient documentation

## 2016-01-28 DIAGNOSIS — Y999 Unspecified external cause status: Secondary | ICD-10-CM | POA: Insufficient documentation

## 2016-01-28 DIAGNOSIS — Y939 Activity, unspecified: Secondary | ICD-10-CM | POA: Insufficient documentation

## 2016-01-28 DIAGNOSIS — E119 Type 2 diabetes mellitus without complications: Secondary | ICD-10-CM | POA: Insufficient documentation

## 2016-01-28 DIAGNOSIS — M542 Cervicalgia: Secondary | ICD-10-CM | POA: Diagnosis present

## 2016-01-28 MED ORDER — CYCLOBENZAPRINE HCL 10 MG PO TABS: 10.0000 mg | ORAL_TABLET | Freq: Two times a day (BID) | ORAL | 0 refills | Status: DC | PRN

## 2016-01-28 MED ORDER — IBUPROFEN 800 MG PO TABS: 800.0000 mg | ORAL_TABLET | Freq: Three times a day (TID) | ORAL | 0 refills | Status: DC

## 2016-01-28 NOTE — ED Notes (Signed)
Bed: WA19 Expected date:  Expected time:  Means of arrival:  Comments: 
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
## 2016-07-01 ENCOUNTER — Emergency Department (HOSPITAL_COMMUNITY): Payer: Self-pay

## 2016-07-01 ENCOUNTER — Emergency Department (HOSPITAL_COMMUNITY)
Admission: EM | Admit: 2016-07-01 | Discharge: 2016-07-01 | Disposition: A | Discharge status: Home / Self Care | Payer: Self-pay | Attending: Emergency Medicine | Admitting: Emergency Medicine

## 2016-07-01 ENCOUNTER — Encounter (HOSPITAL_COMMUNITY): Payer: Self-pay | Admitting: Emergency Medicine

## 2016-07-01 DIAGNOSIS — J00 Acute nasopharyngitis [common cold]: Secondary | ICD-10-CM | POA: Insufficient documentation

## 2016-07-01 DIAGNOSIS — J40 Bronchitis, not specified as acute or chronic: Secondary | ICD-10-CM | POA: Insufficient documentation

## 2016-07-01 DIAGNOSIS — I10 Essential (primary) hypertension: Secondary | ICD-10-CM | POA: Insufficient documentation

## 2016-07-01 DIAGNOSIS — E119 Type 2 diabetes mellitus without complications: Secondary | ICD-10-CM | POA: Insufficient documentation

## 2016-07-01 DIAGNOSIS — Z7984 Long term (current) use of oral hypoglycemic drugs: Secondary | ICD-10-CM | POA: Insufficient documentation

## 2016-07-01 DIAGNOSIS — Z79899 Other long term (current) drug therapy: Secondary | ICD-10-CM | POA: Insufficient documentation

## 2016-07-01 LAB — BASIC METABOLIC PANEL
ANION GAP: 10 (ref 5–15)
BUN: 8 mg/dL (ref 6–20)
CALCIUM: 9.5 mg/dL (ref 8.9–10.3)
CO2: 26 mmol/L (ref 22–32)
Chloride: 99 mmol/L — ABNORMAL LOW (ref 101–111)
Creatinine, Ser: 0.96 mg/dL (ref 0.61–1.24)
GLUCOSE: 395 mg/dL — AB (ref 65–99)
POTASSIUM: 4.1 mmol/L (ref 3.5–5.1)
Sodium: 135 mmol/L (ref 135–145)

## 2016-07-01 LAB — CBC
HEMATOCRIT: 41.9 % (ref 39.0–52.0)
HEMOGLOBIN: 13.9 g/dL (ref 13.0–17.0)
MCH: 26 pg (ref 26.0–34.0)
MCHC: 33.2 g/dL (ref 30.0–36.0)
MCV: 78.5 fL (ref 78.0–100.0)
Platelets: 292 10*3/uL (ref 150–400)
RBC: 5.34 MIL/uL (ref 4.22–5.81)
RDW: 14.3 % (ref 11.5–15.5)
WBC: 10.4 10*3/uL (ref 4.0–10.5)

## 2016-07-01 LAB — I-STAT TROPONIN, ED: Troponin i, poc: 0.02 ng/mL (ref 0.00–0.08)

## 2016-07-01 NOTE — ED Provider Notes (Addendum)
[facility]-EMERGENCY DEPT Provider Note   CSN: [id number] Arrival date & time: 07/01/16  7829     History   Chief Complaint Chief Complaint  Patient presents with  . URI  . Chest Pain    HPI Gabriel Waters is a 41 y.o. male.   URI   Associated symptoms include chest pain and cough. Pertinent negatives include no abdominal pain, no nausea and no vomiting.  Chest Pain   This is a recurrent (similar to prior Brochitis) problem. The current episode started yesterday. The problem occurs constantly. Progression since onset: fluctuating. The pain is associated with coughing. The pain is present in the substernal region. The quality of the pain is described as pressure-like. The pain does not radiate. Associated symptoms include cough, shortness of breath and sputum production. Pertinent negatives include no abdominal pain, no fever, no lower extremity edema, no malaise/fatigue, no nausea and no vomiting. Risk factors include male gender and obesity.  His past medical history is significant for diabetes and hypertension.  Pertinent negatives for past medical history include no CAD, no CHF, no DVT, no hyperlipidemia, no MI, no PE and no TIA.    Past Medical History:  Diagnosis Date  . DIABETES MELLITUS, TYPE II, UNCONTROLLED 01/02/2010  . ELEVATED BLOOD PRESSURE 12/31/2009  . Hypertension   . Pancreatitis 09/13/2013  . Sleep apnea     Patient Active Problem List   Diagnosis Date Noted  . Pancreatitis, acute 09/13/2013  . Acute pancreatitis 09/13/2013  . Hypertension   . Low testosterone 06/08/2013  . OSA (obstructive sleep apnea) 04/05/2013  . Erectile dysfunction 01/04/2013  . Severe obesity (BMI >= 40) (HCC) 12/05/2012  . Hyperlipidemia 04/01/2010  . Type 2 diabetes mellitus, uncontrolled (HCC) 01/02/2010  . ABSCESS, NECK 12/31/2009  . 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 POLYDIPSIA 12/31/2009  . ELEVATED BLOOD PRESSURE 12/31/2009    Past Surgical History:  Procedure Laterality Date  .  WISDOM TOOTH EXTRACTION         Home Medications    Prior to Admission medications   Medication Sig Start Date End Date Taking? Authorizing Provider  cyclobenzaprine (FLEXERIL) 10 MG tablet Take 1 tablet (10 mg total) by mouth 2 (two) times daily as needed for muscle spasms. 01/28/16   Mackuen, Courteney Lyn, MD  fluticasone (FLONASE) 50 MCG/ACT nasal spray Place 2 sprays into both nostrils daily as needed for allergies or rhinitis.    [provider]  ibuprofen (ADVIL,MOTRIN) 800 MG tablet Take 1 tablet (800 mg total) by mouth 3 (three) times daily. 01/28/16   Mackuen, Courteney Lyn, MD  metFORMIN (GLUCOPHAGE) 500 MG tablet TAKE 2 TABLETS TWICE A DAY Patient taking differently: TAKE 1000mg  TABLETS TWICE A DAY 11/03/14   Burchette, Elberta Fortis, MD  nystatin cream (MYCOSTATIN) Apply 1 application topically 2 (two) times daily. For a week Patient taking differently: Apply 1 application topically 2 (two) times daily as needed for dry skin. For a week 11/11/13   Kristian Covey, MD  omeprazole (PRILOSEC) 40 MG capsule TAKE ONE CAPSULE BY MOUTH EVERY DAY Patient taking differently: TAKE ONE CAPSULE BY MOUTH EVERY DAY PRN ACID REFLUX 01/23/14   Burchette, Elberta Fortis, MD  sitaGLIPtin (JANUVIA) 100 MG tablet Take 1 tablet (100 mg total) by mouth daily. Patient not taking: Reported on 01/28/2016 09/07/14   Kristian Covey, MD  Testosterone 40.5 MG/2.5GM (1.62%) GEL 1 spray per arm once daily. Patient not taking: Reported on 01/28/2016 05/23/14   Kristian Covey, MD  Family History Family History  Problem Relation Age of Onset  . Diabetes Father   . Heart disease Father   . Emphysema Father   . Sarcoidosis Father   . Heart disease Mother   . Allergies Mother   . Lung cancer Mother     Social History Social History  Substance Use Topics  . Smoking status: Never Smoker  . Smokeless tobacco: Never Used  . Alcohol use Yes     Comment: social--2-3 glasses wine occassionally      Allergies   Bydureon [exenatide]; Invokana [canagliflozin]; Lipitor [atorvastatin]; and Lisinopril   Review of Systems Review of Systems  Constitutional: Negative for fever and malaise/fatigue.  Respiratory: Positive for cough, sputum production and shortness of breath.   Cardiovascular: Positive for chest pain.  Gastrointestinal: Negative for abdominal pain, nausea and vomiting.   All other systems are reviewed and are negative for acute change except as noted in the HPI   Physical Exam Updated Vital Signs BP (!) 139/93 (BP Location: Right Arm)   Pulse 94   Temp 98.2 F (36.8 C) (Oral)   Resp 18   Ht 6\' 3"  (1.905 m)   Wt (!) 141.5 kg (312 lb)   SpO2 100%   BMI 39.00 kg/m   Physical Exam  Constitutional: He is oriented to person, place, and time. He appears well-developed and well-nourished. No distress.  HENT:  Head: Normocephalic and atraumatic.  Nose: Nose normal.  Post nasal drip   Eyes: Conjunctivae and EOM are normal. Pupils are equal, round, and reactive to light. Right eye exhibits no discharge. Left eye exhibits no discharge. No scleral icterus.  Neck: Normal range of motion. Neck supple.  Cardiovascular: Normal rate and regular rhythm.  Exam reveals no gallop and no friction rub.   No murmur heard. Pulmonary/Chest: Effort normal and breath sounds normal. No stridor. No respiratory distress. He has no rales.  Coarse lung sounds with coughing, otherwise CTAB.  Abdominal: Soft. He exhibits no distension. There is no tenderness.  Musculoskeletal: He exhibits no edema or tenderness.  Neurological: He is alert and oriented to person, place, and time.  Skin: Skin is warm and dry. No rash noted. He is not diaphoretic. No erythema.  Psychiatric: He has a normal mood and affect.  Vitals reviewed.    ED Treatments / Results  Labs (all labs ordered are listed, but only abnormal results are displayed) Labs Reviewed  BASIC METABOLIC PANEL - Abnormal; Notable  for the following:       Result Value   Chloride 99 (*)    Glucose, Bld 395 (*)    All other components within normal limits  CBC  I-STAT TROPOININ, ED    EKG  EKG Interpretation  Date/Time:  Tuesday July 01 2016 09:59:46 EDT Ventricular Rate:  95 PR Interval:  142 QRS Duration: 80 QT Interval:  354 QTC Calculation: 444 R Axis:   6 Text Interpretation:  Normal sinus rhythm Low voltage QRS Cannot rule out Anterior infarct , age undetermined Abnormal ECG No significant change since last tracing Confirmed by Drema Pryardama, Musette Kisamore [phone]) on 07/01/2016 11:02:25 AM       Radiology Dg Chest 2 View  Result Date: 07/01/2016 CLINICAL DATA:  Shortness of breath, chest tightness, cough EXAM: CHEST  2 VIEW COMPARISON:  07/18/2014 FINDINGS: Lungs are clear.  No pleural effusion or pneumothorax. The heart is normal in size. Visualized osseous structures are within normal limits. IMPRESSION: Normal chest radiographs. Electronically Signed   By: Lurlean HornsSriyesh  Rito Ehrlich M.D.   On: 07/01/2016 10:45    Procedures Procedures (including critical care time)  Medications Ordered in ED Medications - No data to display   Initial Impression / Assessment and Plan / ED Course  I have reviewed the triage vital signs and the nursing notes.  Pertinent labs & imaging results that were available during my care of the patient were reviewed by me and considered in my medical decision making (see chart for details).     Presentation most consistent with URI with superimposed bronchitis. Chest x-ray without evidence of pneumonia or pneumothorax.  Highly inconsistent with ACS. EKG without acute ischemic changes or evidence of pericarditis. Triage labs revealed negative troponin. Feel this is sufficient to rule out ACS. Presentation is classic for aortic dissection or esophageal perforation. Low pretest probability for pulmonary embolism.  Discussed symptomatic treatment. The patient is safe for discharge with strict  return precautions.   Final Clinical Impressions(s) / ED Diagnoses   Final diagnoses:  Acute nasopharyngitis  Bronchitis   Disposition: Discharge  Condition: Good  I have discussed the results, Dx and Tx plan with the patient who expressed understanding and agree(s) with the plan. Discharge instructions discussed at great length. The patient was given strict return precautions who verbalized understanding of the instructions. No further questions at time of discharge.    New Prescriptions   No medications on file    Follow Up: Kristian Covey, MD [address] [place] [address] [phone]  Schedule an appointment as soon as possible for a visit        Eudelia Bunch Amadeo Garnet, MD 07/23/16 [phone]

## 2016-07-01 NOTE — ED Triage Notes (Signed)
C/o chest pain, started this am, has had an upper resp infection/cough for approx 1 week, chest pain started this am, "feels like something is on my chest" also c/o throat pain-- hurts to cough.

## 2016-07-01 NOTE — ED Notes (Signed)
Pt transported from lobby to xray

## 2016-10-09 ENCOUNTER — Encounter: Payer: Self-pay | Admitting: Family Medicine

## 2017-02-10 ENCOUNTER — Other Ambulatory Visit: Payer: Self-pay | Admitting: Family Medicine

## 2017-03-31 ENCOUNTER — Encounter: Payer: Self-pay | Admitting: Family Medicine

## 2017-03-31 ENCOUNTER — Ambulatory Visit (INDEPENDENT_AMBULATORY_CARE_PROVIDER_SITE_OTHER): Payer: PRIVATE HEALTH INSURANCE | Admitting: Family Medicine

## 2017-03-31 ENCOUNTER — Ambulatory Visit: Payer: Self-pay | Admitting: Family Medicine

## 2017-03-31 VITALS — BP 140/98 | HR 100 | Temp 98.4°F | Ht 72.5 in | Wt 312.1 lb

## 2017-03-31 DIAGNOSIS — Z0000 Encounter for general adult medical examination without abnormal findings: Secondary | ICD-10-CM | POA: Diagnosis not present

## 2017-03-31 DIAGNOSIS — Z125 Encounter for screening for malignant neoplasm of prostate: Secondary | ICD-10-CM

## 2017-03-31 LAB — MICROALBUMIN / CREATININE URINE RATIO
Creatinine,U: 85.4 mg/dL
Microalb Creat Ratio: 0.8 mg/g (ref 0.0–30.0)

## 2017-03-31 LAB — CBC WITH DIFFERENTIAL/PLATELET
BASOS PCT: 0.7 % (ref 0.0–3.0)
Basophils Absolute: 0.1 10*3/uL (ref 0.0–0.1)
EOS PCT: 1.3 % (ref 0.0–5.0)
Eosinophils Absolute: 0.1 10*3/uL (ref 0.0–0.7)
HEMATOCRIT: 45.2 % (ref 39.0–52.0)
Hemoglobin: 15.2 g/dL (ref 13.0–17.0)
LYMPHS ABS: 2 10*3/uL (ref 0.7–4.0)
LYMPHS PCT: 24.7 % (ref 12.0–46.0)
MCHC: 33.7 g/dL (ref 30.0–36.0)
MCV: 78.6 fl (ref 78.0–100.0)
MONOS PCT: 8 % (ref 3.0–12.0)
Monocytes Absolute: 0.6 10*3/uL (ref 0.1–1.0)
NEUTROS ABS: 5.2 10*3/uL (ref 1.4–7.7)
NEUTROS PCT: 65.3 % (ref 43.0–77.0)
PLATELETS: 302 10*3/uL (ref 150.0–400.0)
RBC: 5.75 Mil/uL (ref 4.22–5.81)
RDW: 15.1 % (ref 11.5–15.5)
WBC: 7.9 10*3/uL (ref 4.0–10.5)

## 2017-03-31 LAB — BASIC METABOLIC PANEL
BUN: 12 mg/dL (ref 6–23)
CALCIUM: 9.9 mg/dL (ref 8.4–10.5)
CHLORIDE: 97 meq/L (ref 96–112)
CO2: 29 mEq/L (ref 19–32)
CREATININE: 0.97 mg/dL (ref 0.40–1.50)
GFR: 108.84 mL/min (ref >60.00)
Glucose, Bld: 345 mg/dL — ABNORMAL HIGH (ref 70–99)
Potassium: 4.3 mEq/L (ref 3.5–5.1)
Sodium: 134 mEq/L — ABNORMAL LOW (ref 135–145)

## 2017-03-31 LAB — HEPATIC FUNCTION PANEL
ALBUMIN: 4.2 g/dL (ref 3.5–5.2)
ALT: 17 U/L (ref 0–53)
AST: 14 U/L (ref 0–37)
Alkaline Phosphatase: 94 U/L (ref 39–117)
BILIRUBIN DIRECT: 0.2 mg/dL (ref 0.0–0.3)
TOTAL PROTEIN: 7.3 g/dL (ref 6.0–8.3)
Total Bilirubin: 1.7 mg/dL — ABNORMAL HIGH (ref 0.2–1.2)

## 2017-03-31 LAB — LIPID PANEL
CHOL/HDL RATIO: 5
CHOLESTEROL: 193 mg/dL (ref 0–200)
HDL: 36.4 mg/dL — ABNORMAL LOW (ref >39.00)
NonHDL: 156.68
TRIGLYCERIDES: 205 mg/dL — AB (ref 0.0–149.0)
VLDL: 41 mg/dL — ABNORMAL HIGH (ref 0.0–40.0)

## 2017-03-31 LAB — PSA: PSA: 0.57 ng/mL (ref 0.10–4.00)

## 2017-03-31 LAB — HEMOGLOBIN A1C: HEMOGLOBIN A1C: 12.9 % — AB (ref 4.6–6.5)

## 2017-03-31 LAB — TSH: TSH: 0.87 u[IU]/mL (ref 0.35–4.50)

## 2017-03-31 LAB — LDL CHOLESTEROL, DIRECT: LDL DIRECT: 129 mg/dL

## 2017-03-31 NOTE — Patient Instructions (Signed)
Set up eye exam We will call after labs back We will likely be getting back on some medications- but would like to get labs first.

## 2017-03-31 NOTE — Progress Notes (Signed)
Subjective:     Patient ID: Gabriel Waters, male   DOB: December 12, 1975, 42 y.o.   MRN: [id number]  HPI   Patient seen for physical exam. He has not been seen here in about 2-1/2 years. He's had some sporadic follow-up in the ER/Urgent care for various acute things. He has history of morbid obesity, type 2 diabetes, hypertension. He had episode of pancreatitis 2015. He was taken off Bydurion at that point. He's also had previous intolerance with invokana with yeast infection. There had also some question whether lisinopril may have triggered his pancreatitis. He plans to set up eye exam soon. He has not had any recent lab work.  He has lost substantial amount of weight since couple years ago when here last and states some of that he thinks may be effort related. Unfortunately though he does have some days where he has some polyuria and my concern is whether some of this weight loss may be due to poorly controlled diabetes. His wife has also type II diabetic.  Tetanus is up-to-date. He's had previous Pneumovax.  Never smoked. No alcohol use.  Past Medical History:  Diagnosis Date  . DIABETES MELLITUS, TYPE II, UNCONTROLLED 01/02/2010  . ELEVATED BLOOD PRESSURE 12/31/2009  . Hypertension   . Pancreatitis 09/13/2013  . Sleep apnea    Past Surgical History:  Procedure Laterality Date  . WISDOM TOOTH EXTRACTION      reports that  has never smoked. he has never used smokeless tobacco. He reports that he drinks alcohol. He reports that he does not use drugs. family history includes Allergies in his mother; Diabetes in his father; Emphysema in his father; Heart disease in his father and mother; Lung cancer in his mother; Sarcoidosis in his father. Allergies  Allergen Reactions  . Bydureon [Exenatide] Other (See Comments)    pancreatitis  . Invokana [Canagliflozin] Other (See Comments)    Yeast balanitis  . Lipitor [Atorvastatin] Other (See Comments)    Arthralgias.  . Lisinopril Other (See  Comments)    pancreatitis     Review of Systems  Constitutional: Negative for activity change, appetite change, fatigue and fever.  HENT: Negative for congestion, ear pain and trouble swallowing.   Eyes: Negative for pain and visual disturbance.  Respiratory: Negative for cough, shortness of breath and wheezing.   Cardiovascular: Negative for chest pain and palpitations.  Gastrointestinal: Negative for abdominal distention, abdominal pain, blood in stool, constipation, diarrhea, nausea, rectal pain and vomiting.  Genitourinary: Negative for dysuria, hematuria and testicular pain.  Musculoskeletal: Negative for arthralgias and joint swelling.  Skin: Negative for rash.  Neurological: Negative for dizziness, syncope and headaches.  Hematological: Negative for adenopathy.  Psychiatric/Behavioral: Negative for confusion and dysphoric mood.       Objective:   Physical Exam  Constitutional: He is oriented to person, place, and time. He appears well-developed and well-nourished. No distress.  HENT:  Head: Normocephalic and atraumatic.  Right Ear: External ear normal.  Left Ear: External ear normal.  Mouth/Throat: Oropharynx is clear and moist.  Eyes: Conjunctivae and EOM are normal. Pupils are equal, round, and reactive to light.  Neck: Normal range of motion. Neck supple. No thyromegaly present.  Cardiovascular: Normal rate, regular rhythm and normal heart sounds.  No murmur heard. Pulmonary/Chest: No respiratory distress. He has no wheezes. He has no rales.  Abdominal: Soft. Bowel sounds are normal. He exhibits no distension and no mass. There is no tenderness. There is no rebound and no guarding.  Musculoskeletal: He  exhibits no edema.  Lymphadenopathy:    He has no cervical adenopathy.  Neurological: He is alert and oriented to person, place, and time. He displays normal reflexes. No cranial nerve deficit.  Skin: No rash noted.  Psychiatric: He has a normal mood and affect.        Assessment:     Physical exam. Patient has multiple problems including morbid obesity, type 2 diabetes, hypertension, history of pancreatitis. Currently not on any medications. Several issues were addressed as below    Plan:     -Will Likely need to be on several medications including diabetes medications, blood pressure medication, statin- but would like to get labs first -Set up diabetic eye exam -Check labs today with lipid, hepatic, basic metabolic panel, CBC, TSH, PSA, urine microalbumin, A1c -He will not be a good candidate for a couple of classes of medication including GLP-1 and SGLT-2 because of issues above  Kristian CoveyBruce W Burchette MD [facility]  -

## 2017-04-01 ENCOUNTER — Other Ambulatory Visit: Payer: Self-pay | Admitting: Family Medicine

## 2017-04-01 MED ORDER — LOSARTAN POTASSIUM 50 MG PO TABS: 50.0000 mg | ORAL_TABLET | Freq: Every day | ORAL | 0 refills | Status: DC

## 2017-04-01 MED ORDER — ROSUVASTATIN CALCIUM 20 MG PO TABS: 20.0000 mg | ORAL_TABLET | Freq: Every day | ORAL | 0 refills | Status: DC

## 2017-04-01 MED ORDER — METFORMIN HCL 500 MG PO TABS: ORAL_TABLET | ORAL | 0 refills | Status: DC

## 2017-05-04 ENCOUNTER — Ambulatory Visit: Payer: Self-pay | Admitting: Family Medicine

## 2017-05-18 ENCOUNTER — Ambulatory Visit: Payer: Self-pay | Admitting: Family Medicine

## 2017-06-22 ENCOUNTER — Other Ambulatory Visit: Payer: Self-pay | Admitting: Family Medicine

## 2017-07-15 ENCOUNTER — Ambulatory Visit (INDEPENDENT_AMBULATORY_CARE_PROVIDER_SITE_OTHER): Payer: Self-pay | Admitting: Family Medicine

## 2017-07-15 ENCOUNTER — Encounter: Payer: Self-pay | Admitting: Family Medicine

## 2017-07-15 VITALS — BP 104/68 | HR 108 | Temp 98.6°F | Wt 306.4 lb

## 2017-07-15 DIAGNOSIS — E1165 Type 2 diabetes mellitus with hyperglycemia: Secondary | ICD-10-CM

## 2017-07-15 DIAGNOSIS — E785 Hyperlipidemia, unspecified: Secondary | ICD-10-CM

## 2017-07-15 DIAGNOSIS — I10 Essential (primary) hypertension: Secondary | ICD-10-CM

## 2017-07-15 LAB — POCT GLYCOSYLATED HEMOGLOBIN (HGB A1C): Hemoglobin A1C: 13.6 % — AB (ref 4.0–5.6)

## 2017-07-15 MED ORDER — METFORMIN HCL 500 MG PO TABS: ORAL_TABLET | ORAL | 1 refills | Status: DC

## 2017-07-15 MED ORDER — GLIMEPIRIDE 4 MG PO TABS: 4.0000 mg | ORAL_TABLET | Freq: Every day | ORAL | 5 refills | Status: DC

## 2017-07-15 NOTE — Progress Notes (Signed)
  Subjective:     Patient ID: Gabriel Waters, male   DOB: 05/14/1975, 42 y.o.   MRN: [id number]  HPI Patient here for diabetes follow-up. He recently just came back in after being gone for quite some time. Unfortunately, he was laid off from his job just one week after he was seen here and basically has been taking only metformin since his follow-up along with Crestor. Recent A1c 12.9%. His current medications are losartan, metformin, and Crestor.  He is on metformin 500 mg twice daily. He cannot tolerate higher dose secondary to loose stools and diarrhea. History of pancreatitis with bydurion. History of yeast infection with invokana  Past Medical History:  Diagnosis Date  . DIABETES MELLITUS, TYPE II, UNCONTROLLED 01/02/2010  . ELEVATED BLOOD PRESSURE 12/31/2009  . Hypertension   . Pancreatitis 09/13/2013  . Sleep apnea    Past Surgical History:  Procedure Laterality Date  . WISDOM TOOTH EXTRACTION      reports that he has never smoked. He has never used smokeless tobacco. He reports that he drinks alcohol. He reports that he does not use drugs. family history includes Allergies in his mother; Diabetes in his father; Emphysema in his father; Heart disease in his father and mother; Lung cancer in his mother; Sarcoidosis in his father. Allergies  Allergen Reactions  . Bydureon [Exenatide] Other (See Comments)    pancreatitis  . Invokana [Canagliflozin] Other (See Comments)    Yeast balanitis  . Lipitor [Atorvastatin] Other (See Comments)    Arthralgias.  . Lisinopril Other (See Comments)    pancreatitis     Review of Systems  Constitutional: Negative for fatigue and unexpected weight change.  Eyes: Negative for visual disturbance.  Respiratory: Negative for cough, chest tightness and shortness of breath.   Cardiovascular: Negative for chest pain, palpitations and leg swelling.  Gastrointestinal: Negative for abdominal pain.  Neurological: Negative for dizziness, syncope,  weakness, light-headedness and headaches.       Objective:   Physical Exam  Constitutional: He is oriented to person, place, and time. He appears well-developed and well-nourished.  HENT:  Right Ear: External ear normal.  Left Ear: External ear normal.  Mouth/Throat: Oropharynx is clear and moist.  Eyes: Pupils are equal, round, and reactive to light.  Neck: Neck supple. No thyromegaly present.  Cardiovascular: Normal rate and regular rhythm.  Pulmonary/Chest: Effort normal and breath sounds normal. No respiratory distress. He has no wheezes. He has no rales.  Musculoskeletal: He exhibits no edema.  Neurological: He is alert and oriented to person, place, and time.       Assessment:     #1 type 2 diabetes very poorly controlled with history of poor compliance. Poor compliance partly related to loss of insurance and financial strains of medication  #2 hypertension improved  #3 dyslipidemia now on Crestor    Plan:     -Discussed diabetes medications at some length.    *Intolerant of metformin higher doses    *Not it candidate for GLP-1 class because of previous pancreatitis     *Previous yeast balanitis with invokana  -We discussed other alternatives including insulin versus sulfonylurea and he prefers the latter. Start Amaryl 4 mg once daily -Bring back in 3 weeks to reassess. He was given home glucose monitor today and will record several readings between now and follow-up -continue Losartan and Crestor.  Kristian CoveyBruce W Anjeli Casad MD [facility]

## 2017-07-15 NOTE — Patient Instructions (Signed)
Check several home blood sugars and record and bring back for review.   Start the Amaryl one daily.

## 2018-02-03 ENCOUNTER — Telehealth: Payer: Self-pay | Admitting: Family Medicine

## 2018-02-03 MED ORDER — LOSARTAN POTASSIUM 50 MG PO TABS: 50.0000 mg | ORAL_TABLET | Freq: Every day | ORAL | 1 refills | Status: DC

## 2018-02-03 MED ORDER — ROSUVASTATIN CALCIUM 20 MG PO TABS: 20.0000 mg | ORAL_TABLET | Freq: Every day | ORAL | 1 refills | Status: AC

## 2018-02-03 MED ORDER — GLIMEPIRIDE 4 MG PO TABS: 4.0000 mg | ORAL_TABLET | Freq: Every day | ORAL | 5 refills | Status: DC

## 2018-02-03 MED ORDER — METFORMIN HCL 500 MG PO TABS: ORAL_TABLET | ORAL | 1 refills | Status: DC

## 2018-02-03 NOTE — Telephone Encounter (Signed)
[employer] form to be filled out- placed in dr's folder.   Fax to: [phone], Attn: [employer]

## 2018-02-03 NOTE — Telephone Encounter (Signed)
Prescriptions have been sent in. Nothing further needed.  

## 2018-02-03 NOTE — Telephone Encounter (Signed)
Patient needs a refill on Metformin, Glimepiride, Losartan, and Rosuvastatin.  Patient is requesting a 90 day supply.  Pharmacy:  [facility]- [address] in [place]

## 2018-02-05 NOTE — Telephone Encounter (Signed)
Paperwork placed in red folder on your desk. Please return when complete so I can fax. Thank you!

## 2018-02-05 NOTE — Telephone Encounter (Signed)
done

## 2018-02-10 NOTE — Telephone Encounter (Signed)
This paperwork has been faxed

## 2018-04-26 ENCOUNTER — Telehealth: Payer: Self-pay | Admitting: *Deleted

## 2018-04-26 NOTE — Telephone Encounter (Signed)
RN called to schedule patient a DM f/u appointment. No answer. LVM for patient to return call.

## 2018-05-03 ENCOUNTER — Other Ambulatory Visit: Payer: Self-pay

## 2018-05-03 ENCOUNTER — Ambulatory Visit (INDEPENDENT_AMBULATORY_CARE_PROVIDER_SITE_OTHER): Payer: 59 | Admitting: Family Medicine

## 2018-05-03 DIAGNOSIS — E1165 Type 2 diabetes mellitus with hyperglycemia: Secondary | ICD-10-CM

## 2018-05-03 DIAGNOSIS — I10 Essential (primary) hypertension: Secondary | ICD-10-CM

## 2018-05-03 DIAGNOSIS — E785 Hyperlipidemia, unspecified: Secondary | ICD-10-CM | POA: Diagnosis not present

## 2018-05-03 MED ORDER — GLUCOSE BLOOD VI STRP: ORAL_STRIP | 3 refills | Status: DC

## 2018-05-03 MED ORDER — ACCU-CHEK MULTICLIX LANCETS MISC: 3 refills | Status: DC

## 2018-05-03 NOTE — Progress Notes (Signed)
Patient ID: Gabriel Waters, male   DOB: Nov 22, 1975, 43 y.o.   MRN: [id number]  Virtual Visit via Video Note  I connected with@ on 05/03/18 at  2:30 PM EDT by a video enabled telemedicine application and verified that I am speaking with the correct person using two identifiers.  Location patient: home Location provider:work or home office Persons participating in the virtual visit: patient, provider  I discussed the limitations of evaluation and management by telemedicine and the availability of in person appointments. The patient expressed understanding and agreed to proceed.   HPI: Patient has history of morbid obesity, type 2 diabetes, hypertension, dyslipidemia.  We started back several medications last year.  He had been lost to follow-up because of loss of insurance.  He has had diarrhea with higher dose metformin.  He had pancreatitis with GLP-1 medication and yeast balanitis with SGLT2 medication.  We started back Amaryl 4 mg daily.  His polyuria and polydipsia have ceased.  He also remains on lower dose metformin.  He needs refills of test strips and lancets.  Not monitoring blood sugars recently.  Last A1c was extremely high at 13.6.  He is overdue for labs.  He remains on Crestor for hyperlipidemia.  He is on losartan for hypertension.  Denies any chest pains, headaches, dizziness.  Generally feels well overall.   ROS: See pertinent positives and negatives per HPI.  Past Medical History:  Diagnosis Date  . DIABETES MELLITUS, TYPE II, UNCONTROLLED 01/02/2010  . ELEVATED BLOOD PRESSURE 12/31/2009  . Hypertension   . Pancreatitis 09/13/2013  . Sleep apnea     Past Surgical History:  Procedure Laterality Date  . WISDOM TOOTH EXTRACTION      Family History  Problem Relation Age of Onset  . Diabetes Father   . Heart disease Father   . Emphysema Father   . Sarcoidosis Father   . Heart disease Mother   . Allergies Mother   . Lung cancer Mother     SOCIAL HX: Non-smoker.   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  fluticasone (FLONASE) 50 MCG/ACT nasal spray, Place 2 sprays into both nostrils daily as needed for allergies or rhinitis., Disp: , Rfl:  .  glimepiride (AMARYL) 4 MG tablet, Take 1 tablet (4 mg total) by mouth daily before breakfast., Disp: 30 tablet, Rfl: 5 .  ibuprofen (ADVIL,MOTRIN) 800 MG tablet, Take 1 tablet (800 mg total) by mouth 3 (three) times daily., Disp: 21 tablet, Rfl: 0 .  losartan (COZAAR) 50 MG tablet, Take 1 tablet (50 mg total) by mouth daily., Disp: 90 tablet, Rfl: 1 .  metFORMIN (GLUCOPHAGE) 500 MG tablet, TAKE 1 TABLET BY MOUTH TWICE A DAY, Disp: 180 tablet, Rfl: 1 .  rosuvastatin (CRESTOR) 20 MG tablet, Take 1 tablet (20 mg total) by mouth daily., Disp: 90 tablet, Rfl: 1  EXAM:  VITALS per patient if applicable:  GENERAL: alert, oriented, appears well and in no acute distress  HEENT: atraumatic, conjunttiva clear, no obvious abnormalities on inspection of external nose and ears  NECK: normal movements of the head and neck  LUNGS: on inspection no signs of respiratory distress, breathing rate appears normal, no obvious gross SOB, gasping or wheezing  CV: no obvious cyanosis  MS: moves all visible extremities without noticeable abnormality  PSYCH/NEURO: pleasant and cooperative, no obvious depression or anxiety, speech and thought processing grossly intact  ASSESSMENT AND PLAN:  Discussed the following assessment and plan:  #1 type  2 diabetes.  History of very poor control.  Needs follow-up labs -Sent in refill for lancets and test strips -Future labs for A1c -Also needs follow-up eye exam and foot exam but we are trying to keep out of office until situation is safer with current pandemic  #2 hypertension.  #3 dyslipidemia -Future labs for fasting lipid and hepatic panel -Continue Crestor  #4 history of morbid obesity   I discussed the assessment and treatment plan  with the patient. The patient was provided an opportunity to ask questions and all were answered. The patient agreed with the plan and demonstrated an understanding of the instructions.   The patient was advised to call back or seek an in-person evaluation if the symptoms worsen or if the condition fails to improve as anticipated   Evelena PeatBruce Burchette, MD

## 2018-05-04 ENCOUNTER — Other Ambulatory Visit: Payer: Self-pay

## 2018-05-04 ENCOUNTER — Telehealth: Payer: Self-pay | Admitting: *Deleted

## 2018-05-04 MED ORDER — ACCU-CHEK MULTICLIX LANCET DEV KIT: PACK | 1 refills | Status: DC

## 2018-05-04 NOTE — Telephone Encounter (Signed)
I have sent a new Accu-chek lancet device to the pharmacy for the patient.

## 2018-05-04 NOTE — Telephone Encounter (Signed)
Copied from CRM [id number]. Topic: Quick Communication - Rx Refill/Question >> May 04, 2018 11:34 AM Dalphine Handing A wrote: Medication: Lancets (ACCU-CHEK MULTICLIX) lancets (Patient stated that the machine is broken and thinks it may be a spring.)  Has the patient contacted their pharmacy? Yes (Agent: If no, request that the patient contact the pharmacy for the refill.) (Agent: If yes, when and what did the pharmacy advise?)Contact PCP  Preferred Pharmacy (with phone number or street name): [id number] [place], [place] - [address] AT [address] OF [address] [phone] (Phone) [phone] (Fax)    Agent: Please be advised that RX refills may take up to 3 business days. We ask that you follow-up with your pharmacy.

## 2018-05-10 ENCOUNTER — Other Ambulatory Visit: Payer: Self-pay

## 2018-05-10 ENCOUNTER — Other Ambulatory Visit: Payer: 59

## 2018-05-10 LAB — HEPATIC FUNCTION PANEL
ALT: 13 U/L (ref 0–53)
AST: 14 U/L (ref 0–37)
Albumin: 3.9 g/dL (ref 3.5–5.2)
Alkaline Phosphatase: 87 U/L (ref 39–117)
Bilirubin, Direct: 0.2 mg/dL (ref 0.0–0.3)
Total Bilirubin: 1.5 mg/dL — ABNORMAL HIGH (ref 0.2–1.2)
Total Protein: 6.5 g/dL (ref 6.0–8.3)

## 2018-05-10 LAB — TSH: TSH: 2.29 u[IU]/mL (ref 0.35–4.50)

## 2018-05-10 LAB — BASIC METABOLIC PANEL
BUN: 20 mg/dL (ref 6–23)
CO2: 29 mEq/L (ref 19–32)
Calcium: 9.3 mg/dL (ref 8.4–10.5)
Chloride: 100 mEq/L (ref 96–112)
Creatinine, Ser: 0.87 mg/dL (ref 0.40–1.50)
GFR: 115.5 mL/min (ref >60.00)
Glucose, Bld: 156 mg/dL — ABNORMAL HIGH (ref 70–99)
Potassium: 4.2 mEq/L (ref 3.5–5.1)
Sodium: 136 mEq/L (ref 135–145)

## 2018-05-10 LAB — LIPID PANEL
Cholesterol: 136 mg/dL (ref 0–200)
HDL: 37.7 mg/dL — ABNORMAL LOW (ref >39.00)
LDL Cholesterol: 83 mg/dL (ref 0–99)
NonHDL: 98.73
Total CHOL/HDL Ratio: 4
Triglycerides: 79 mg/dL (ref 0.0–149.0)
VLDL: 15.8 mg/dL (ref 0.0–40.0)

## 2018-05-10 LAB — HEMOGLOBIN A1C: Hgb A1c MFr Bld: 11.4 % — ABNORMAL HIGH (ref 4.6–6.5)

## 2018-05-10 NOTE — Addendum Note (Signed)
Addended by: Conrad [place] on: 05/10/2018 08:41 AM   Modules accepted: Orders

## 2018-05-18 ENCOUNTER — Other Ambulatory Visit: Payer: Self-pay

## 2018-05-18 MED ORDER — SITAGLIPTIN PHOSPHATE 100 MG PO TABS: 100.0000 mg | ORAL_TABLET | Freq: Every day | ORAL | 2 refills | Status: DC

## 2018-05-19 ENCOUNTER — Ambulatory Visit: Payer: Self-pay | Admitting: Family Medicine

## 2018-05-19 NOTE — Telephone Encounter (Signed)
Pt called in and was given lab results from Dr.  Caryl Never  dated 05/10/2018 at 12:21 PM.  He verbalized understanding to get the Januvia from the pharmacy.  I scheduled him for lab A1c recheck on August 18, 2018 at 7:45 AM.

## 2018-08-18 ENCOUNTER — Other Ambulatory Visit: Payer: 59

## 2018-09-21 ENCOUNTER — Encounter: Payer: Self-pay | Admitting: Emergency Medicine

## 2018-09-21 ENCOUNTER — Other Ambulatory Visit: Payer: Self-pay

## 2018-09-21 DIAGNOSIS — I10 Essential (primary) hypertension: Secondary | ICD-10-CM | POA: Insufficient documentation

## 2018-09-21 DIAGNOSIS — E1165 Type 2 diabetes mellitus with hyperglycemia: Secondary | ICD-10-CM | POA: Insufficient documentation

## 2018-09-21 DIAGNOSIS — Z79899 Other long term (current) drug therapy: Secondary | ICD-10-CM | POA: Insufficient documentation

## 2018-09-21 DIAGNOSIS — R55 Syncope and collapse: Secondary | ICD-10-CM | POA: Insufficient documentation

## 2018-09-21 DIAGNOSIS — Z7984 Long term (current) use of oral hypoglycemic drugs: Secondary | ICD-10-CM | POA: Insufficient documentation

## 2018-09-21 LAB — CBC
HCT: 40.8 % (ref 39.0–52.0)
Hemoglobin: 13.6 g/dL (ref 13.0–17.0)
MCH: 26.1 pg (ref 26.0–34.0)
MCHC: 33.3 g/dL (ref 30.0–36.0)
MCV: 78.3 fL — ABNORMAL LOW (ref 80.0–100.0)
Platelets: 296 10*3/uL (ref 150–400)
RBC: 5.21 MIL/uL (ref 4.22–5.81)
RDW: 13.7 % (ref 11.5–15.5)
WBC: 8.3 10*3/uL (ref 4.0–10.5)
nRBC: 0 % (ref 0.0–0.2)

## 2018-09-21 LAB — URINALYSIS, COMPLETE (UACMP) WITH MICROSCOPIC
Bilirubin Urine: NEGATIVE
Glucose, UA: >=500 mg/dL — AB
Hgb urine dipstick: NEGATIVE
Ketones, ur: 20 mg/dL — AB
Leukocytes,Ua: NEGATIVE
Nitrite: NEGATIVE
Protein, ur: 30 mg/dL — AB
Specific Gravity, Urine: 1.03 (ref 1.005–1.030)
pH: 5 (ref 5.0–8.0)

## 2018-09-21 LAB — BASIC METABOLIC PANEL
Anion gap: 8 (ref 5–15)
BUN: 21 mg/dL — ABNORMAL HIGH (ref 6–20)
CO2: 27 mmol/L (ref 22–32)
Calcium: 9.2 mg/dL (ref 8.9–10.3)
Chloride: 95 mmol/L — ABNORMAL LOW (ref 98–111)
Creatinine, Ser: 1.29 mg/dL — ABNORMAL HIGH (ref 0.61–1.24)
GFR calc Af Amer: >60 mL/min (ref >60)
GFR calc non Af Amer: >60 mL/min (ref >60)
Glucose, Bld: 409 mg/dL — ABNORMAL HIGH (ref 70–99)
Potassium: 5.3 mmol/L — ABNORMAL HIGH (ref 3.5–5.1)
Sodium: 130 mmol/L — ABNORMAL LOW (ref 135–145)

## 2018-09-21 LAB — GLUCOSE, CAPILLARY: Glucose-Capillary: 365 mg/dL — ABNORMAL HIGH (ref 70–99)

## 2018-09-21 NOTE — ED Triage Notes (Signed)
Pt arrived via POV with reports of onset of dizziness, diaphoresis, and feeling like legs stuck in mud.  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 PT states he felt like he was working in a humid environment.  Pt recently dx with sinus infection which he completed his antibiotics.

## 2018-09-22 ENCOUNTER — Telehealth: Payer: Self-pay

## 2018-09-22 ENCOUNTER — Emergency Department
Admission: EM | Admit: 2018-09-22 | Discharge: 2018-09-22 | Disposition: A | Discharge status: Home / Self Care | Payer: 59 | Attending: Emergency Medicine | Admitting: Emergency Medicine

## 2018-09-22 DIAGNOSIS — E860 Dehydration: Secondary | ICD-10-CM

## 2018-09-22 DIAGNOSIS — E1165 Type 2 diabetes mellitus with hyperglycemia: Secondary | ICD-10-CM

## 2018-09-22 DIAGNOSIS — R55 Syncope and collapse: Secondary | ICD-10-CM

## 2018-09-22 LAB — CK: Total CK: 70 U/L (ref 49–397)

## 2018-09-22 MED ORDER — SODIUM CHLORIDE 0.9 % IV BOLUS
2000.0000 mL | Freq: Once | INTRAVENOUS | Status: AC
  Administered 2018-09-22: 2000 mL via INTRAVENOUS

## 2018-09-22 NOTE — ED Notes (Signed)
Assessment: pt states today while at work he felt "like crap". 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 Pt states he legs felt heavy and he felt weak. Pt denies fever, cough, shortness of breath. Pt denies chest pain. Pt with normal color warm and dry skin. Pt states has missed doses of his medication for diabetes. Pt denies vomiting, diarrhea. Pt ambulatory without difficulty.

## 2018-09-22 NOTE — ED Notes (Signed)
Pt states is feeling improved.

## 2018-09-22 NOTE — ED Notes (Signed)
Patient observed in lobby in no acute distress.  

## 2018-09-22 NOTE — Discharge Instructions (Signed)
Although we discussed, your work-up was reassuring today.  Your labs suggest that you are a bit dehydrated and we gave you a couple of liters of fluid.  Please drink plenty of clear, non-sugary fluids to stay hydrated and continue with your regular medications.  Call your regular doctor to schedule follow-up appointment.  He may want to schedule some additional outpatient lab work to check on your kidney function.  Let him know that today your creatinine was 1.3 and hopefully it will improve after your fluids and plan to rehydrate at home.  Return to the emergency department if you develop new or worsening symptoms that concern you.

## 2018-09-22 NOTE — ED Provider Notes (Signed)
[facility] Emergency Department Provider Note  ____________________________________________   First MD Initiated Contact with Patient 09/22/18 [phone]     (approximate)  I have reviewed the triage vital signs and the nursing notes.   HISTORY  Chief Complaint Weakness and Dizziness    HPI Gabriel Waters is a 43 y.o. male with medical issues as listed below which notably includes obesity, hypertension, and diabetes not  controlled with insulin but with oral meds.  He presents tonight by private vehicle because he felt "like crap" at work.  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  He started to feel very flushed and lightheaded and sweaty while he was at work.  He sat down and relax but then symptoms started again in spite of drinking oral fluids.  Nothing in particular made the symptoms better or worse and the onset was acute and severe.  He had no point had any chest pain.  He denies sore throat, headache, cough, chest pain, shortness of breath, nausea, vomiting, and abdominal pain.  He has missed a few doses of his diabetes medicine recently.  He has not had any contact with COVID-19 patients.  He has no history of heart disease.  He did not lose consciousness.        Past Medical History:  Diagnosis Date  . DIABETES MELLITUS, TYPE II, UNCONTROLLED 01/02/2010  . ELEVATED BLOOD PRESSURE 12/31/2009  . Hypertension   . Pancreatitis 09/13/2013  . Sleep apnea     Patient Active Problem List   Diagnosis Date Noted  . Pancreatitis, acute 09/13/2013  . Acute pancreatitis 09/13/2013  . Hypertension   . Low testosterone 06/08/2013  . OSA (obstructive sleep apnea) 04/05/2013  . Erectile dysfunction 01/04/2013  . Severe obesity (BMI >= 40) ([place]) 12/05/2012  . Hyperlipidemia 04/01/2010  . Type 2 diabetes mellitus, uncontrolled ([place]) 01/02/2010  . ABSCESS, NECK 12/31/2009  . 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 POLYDIPSIA  12/31/2009  . ELEVATED BLOOD PRESSURE 12/31/2009    Past Surgical History:  Procedure Laterality Date  . WISDOM TOOTH EXTRACTION      Prior to Admission medications   Medication Sig Start Date End Date Taking? Authorizing Provider  fluticasone (FLONASE) 50 MCG/ACT nasal spray Place 2 sprays into both nostrils daily as needed for allergies or rhinitis.    [provider]  glimepiride (AMARYL) 4 MG tablet Take 1 tablet (4 mg total) by mouth daily before breakfast. 02/03/18   Burchette, Alinda Sierras, MD  glucose blood (ACCU-CHEK AVIVA) test strip Use as instructed to check blood sugars two times daily. Dx Code E11.65 05/03/18   Burchette, Alinda Sierras, MD  ibuprofen (ADVIL,MOTRIN) 800 MG tablet Take 1 tablet (800 mg total) by mouth 3 (three) times daily. 01/28/16   Mackuen, Courteney Lyn, MD  Lancets (ACCU-CHEK MULTICLIX) lancets Use as instructed to check blood sugars two times daily. Dx Code E11.65 05/03/18   Burchette, Alinda Sierras, MD  Lancets Misc. (ACCU-CHEK MULTICLIX LANCET DEV) KIT Use as instructed to check blood sugars two times daily. Dx Code E11.65 05/04/18   Burchette, Alinda Sierras, MD  losartan (COZAAR) 50 MG tablet Take 1 tablet (50 mg total) by mouth daily. 02/03/18   Burchette, Alinda Sierras, MD  metFORMIN (GLUCOPHAGE) 500 MG tablet TAKE 1 TABLET BY MOUTH TWICE A DAY 02/03/18   Burchette, Alinda Sierras, MD  rosuvastatin (CRESTOR) 20 MG tablet Take 1 tablet (20 mg total) by mouth daily. 02/03/18  Burchette, Alinda Sierras, MD  sitaGLIPtin (JANUVIA) 100 MG tablet Take 1 tablet (100 mg total) by mouth daily. 05/18/18   Burchette, Alinda Sierras, MD    Allergies Bydureon [exenatide], Invokana [canagliflozin], Lipitor [atorvastatin], and Lisinopril  Family History  Problem Relation Age of Onset  . Diabetes Father   . Heart disease Father   . Emphysema Father   . Sarcoidosis Father   . Heart disease Mother   . Allergies Mother   . Lung cancer Mother     Social History Social History   Tobacco Use  . Smoking  status: Never Smoker  . Smokeless tobacco: Never Used  Substance Use Topics  . Alcohol use: Yes    Comment: social--2-3 glasses wine occassionally  . Drug use: No    Review of Systems Constitutional: No fever/chills Eyes: No visual changes. ENT: No sore throat. Cardiovascular: Near syncope.  Denies chest pain. Respiratory: Denies shortness of breath. Gastrointestinal: No abdominal pain.  No nausea, no vomiting.  No diarrhea.  No constipation. Genitourinary: Negative for dysuria. Musculoskeletal: Negative for neck pain.  Negative for back pain. Integumentary: Negative for rash. Neurological: Negative for headaches, focal weakness or numbness.   ____________________________________________   PHYSICAL EXAM:  VITAL SIGNS: ED Triage Vitals  Enc Vitals Group     BP 09/21/18 2148 108/82     Pulse Rate 09/21/18 2148 (!) 102     Resp 09/21/18 2148 20     Temp 09/21/18 2148 98.8 F (37.1 C)     Temp Source 09/21/18 2148 Oral     SpO2 09/21/18 2148 100 %     Weight 09/21/18 2145 130.6 kg (288 lb)     Height 09/21/18 2145 1.88 m (_0 )     Head Circumference --      Peak Flow --      Pain Score 09/21/18 2145 0     Pain Loc --      Pain Edu? --      Excl. in [place]? --     Constitutional: Alert and oriented.  Well appearing and in no acute distress. Eyes: Conjunctivae are normal.  Head: Atraumatic. Nose: No congestion/rhinnorhea. Mouth/Throat: Mucous membranes are moist. Neck: No stridor.  No meningeal signs.   Cardiovascular: Normal rate, regular rhythm. Good peripheral circulation. Grossly normal heart sounds. Respiratory: Normal respiratory effort.  No retractions. Gastrointestinal: Soft and nontender. No distention.  Musculoskeletal: No lower extremity tenderness nor edema. No gross deformities of extremities. Neurologic:  Normal speech and language. No gross focal neurologic deficits are appreciated.  Skin:  Skin is warm, dry and intact. Psychiatric: Mood and affect are  normal. Speech and behavior are normal.  ____________________________________________   LABS (all labs ordered are listed, but only abnormal results are displayed)  Labs Reviewed  BASIC METABOLIC PANEL - Abnormal; Notable for the following components:      Result Value   Sodium 130 (*)    Potassium 5.3 (*)    Chloride 95 (*)    Glucose, Bld 409 (*)    BUN 21 (*)    Creatinine, Ser 1.29 (*)    All other components within normal limits  CBC - Abnormal; Notable for the following components:   MCV 78.3 (*)    All other components within normal limits  URINALYSIS, COMPLETE (UACMP) WITH MICROSCOPIC - Abnormal; Notable for the following components:   Color, Urine YELLOW (*)    APPearance HAZY (*)    Glucose, UA >=500 (*)    Ketones, ur 20 (*)  Protein, ur 30 (*)    Bacteria, UA RARE (*)    All other components within normal limits  GLUCOSE, CAPILLARY - Abnormal; Notable for the following components:   Glucose-Capillary 365 (*)    All other components within normal limits  CK  CBG MONITORING, ED   ____________________________________________  EKG  ED ECG REPORT I, Hinda Kehr, the attending physician, personally viewed and interpreted this ECG.  Date: 09/21/2018 EKG Time: 21:54 Rate: 100 Rhythm: borderline sinus tachycardia QRS Axis: normal Intervals: normal ST/T Wave abnormalities: T waves appear tall in lead V2 but are otherwise unremarkable Narrative Interpretation: no evidence of acute ischemia  ____________________________________________  RADIOLOGY I, Hinda Kehr, personally viewed and evaluated these images (plain radiographs) as part of my medical decision making, as well as reviewing the written report by the radiologist.  ED MD interpretation: No indication for emergent imaging  Official radiology report(s): No results found.  ____________________________________________   PROCEDURES   Procedure(s) performed (including Critical Care):   Procedures   ____________________________________________   INITIAL IMPRESSION / MDM / [place] / ED COURSE  As part of my medical decision making, I reviewed the following data within the [place] notes reviewed and incorporated, Labs reviewed , EKG interpreted , Old chart reviewed and Notes from prior ED visits   Differential diagnosis includes, but is not limited to, vasovagal episode, dehydration, hyperglycemia, DKA, electrolyte abnormality.  Patient signs and symptoms are consistent with some mild dehydration and vasovagal episode without syncope.  EKG is reassuring and lab work indicates out Volume depletion by virtue of a slightly elevated creatinine, hyperglycemia, and mild hypokalemia and mild hyponatremia although some of this is pseudohyponatremia secondary to his hyperglycemia.  He has some ketones in his urine as well.  I will check a CK and give him 2 L of fluid but I anticipate he should be able to be discharged for outpatient follow-up afterwards.  He is comfortable with this plan.  He did not syncopized and is not having any chest pain.  He has a primary care doctor with whom he can follow-up.  No evidence of emergent medical condition at this time.      Clinical Course as of Sep 22 431  Wed Sep 22, 2018  0314 Normal CK  CK Total: 70 [CF]  0432 Patient stable, will discharge after completion of 2 L of normal saline.  Gave usual and customary return precautions.   [CF]    Clinical Course User Index [CF] Hinda Kehr, MD     ____________________________________________  FINAL CLINICAL IMPRESSION(S) / ED DIAGNOSES  Final diagnoses:  Dehydration  Vasovagal episode  Type 2 diabetes mellitus with hyperglycemia, without long-term current use of insulin ([place])     MEDICATIONS GIVEN DURING THIS VISIT:  Medications  sodium chloride 0.9 % bolus 2,000 mL (0 mLs Intravenous Stopped 09/22/18 0400)     ED Discharge Orders    None       *Please note:  Gabriel Waters was evaluated in Emergency Department on 09/22/2018 for the symptoms described in the history of present illness. He was evaluated in the context of the global COVID-19 pandemic, which necessitated consideration that the patient might be at risk for infection with the SARS-CoV-2 virus that causes COVID-19. Institutional protocols and algorithms that pertain to the evaluation of patients at risk for COVID-19 are in a state of rapid change based on information released by regulatory bodies including the CDC and federal and state organizations. These  policies and algorithms were followed during the patient's care in the ED.  Some ED evaluations and interventions may be delayed as a result of limited staffing during the pandemic.*  Note:  This document was prepared using Dragon voice recognition software and may include unintentional dictation errors.   Hinda Kehr, MD 09/22/18 [phone]

## 2018-09-22 NOTE — Telephone Encounter (Signed)
Called patient and left a detailed voice message to let him know to schedule an appointment for a hospital follow up with Dr. Elease Hashimoto.   OK for [facility] to discuss, advise, and schedule patient for anything that he needs.  CRM Created.

## 2018-09-23 ENCOUNTER — Other Ambulatory Visit: Payer: Self-pay

## 2018-09-23 DIAGNOSIS — I10 Essential (primary) hypertension: Secondary | ICD-10-CM | POA: Insufficient documentation

## 2018-09-23 DIAGNOSIS — Z7984 Long term (current) use of oral hypoglycemic drugs: Secondary | ICD-10-CM | POA: Insufficient documentation

## 2018-09-23 DIAGNOSIS — E860 Dehydration: Secondary | ICD-10-CM | POA: Insufficient documentation

## 2018-09-23 DIAGNOSIS — R55 Syncope and collapse: Secondary | ICD-10-CM | POA: Insufficient documentation

## 2018-09-23 DIAGNOSIS — E119 Type 2 diabetes mellitus without complications: Secondary | ICD-10-CM | POA: Insufficient documentation

## 2018-09-23 DIAGNOSIS — Z79899 Other long term (current) drug therapy: Secondary | ICD-10-CM | POA: Insufficient documentation

## 2018-09-23 LAB — BASIC METABOLIC PANEL
Anion gap: 11 (ref 5–15)
BUN: 19 mg/dL (ref 6–20)
CO2: 25 mmol/L (ref 22–32)
Calcium: 9.4 mg/dL (ref 8.9–10.3)
Chloride: 99 mmol/L (ref 98–111)
Creatinine, Ser: 1.38 mg/dL — ABNORMAL HIGH (ref 0.61–1.24)
GFR calc Af Amer: >60 mL/min (ref >60)
GFR calc non Af Amer: >60 mL/min (ref >60)
Glucose, Bld: 272 mg/dL — ABNORMAL HIGH (ref 70–99)
Potassium: 4.3 mmol/L (ref 3.5–5.1)
Sodium: 135 mmol/L (ref 135–145)

## 2018-09-23 LAB — CBC
HCT: 40.4 % (ref 39.0–52.0)
Hemoglobin: 13.4 g/dL (ref 13.0–17.0)
MCH: 25.9 pg — ABNORMAL LOW (ref 26.0–34.0)
MCHC: 33.2 g/dL (ref 30.0–36.0)
MCV: 78.1 fL — ABNORMAL LOW (ref 80.0–100.0)
Platelets: 304 10*3/uL (ref 150–400)
RBC: 5.17 MIL/uL (ref 4.22–5.81)
RDW: 13.6 % (ref 11.5–15.5)
WBC: 8.3 10*3/uL (ref 4.0–10.5)
nRBC: 0 % (ref 0.0–0.2)

## 2018-09-23 LAB — URINALYSIS, COMPLETE (UACMP) WITH MICROSCOPIC
Bacteria, UA: NONE SEEN
Bilirubin Urine: NEGATIVE
Glucose, UA: 150 mg/dL — AB
Hgb urine dipstick: NEGATIVE
Ketones, ur: 5 mg/dL — AB
Leukocytes,Ua: NEGATIVE
Nitrite: NEGATIVE
Protein, ur: 100 mg/dL — AB
Specific Gravity, Urine: 1.027 (ref 1.005–1.030)
pH: 5 (ref 5.0–8.0)

## 2018-09-23 LAB — TROPONIN I (HIGH SENSITIVITY): Troponin I (High Sensitivity): 5 ng/L (ref <18)

## 2018-09-23 LAB — GLUCOSE, CAPILLARY: Glucose-Capillary: 239 mg/dL — ABNORMAL HIGH (ref 70–99)

## 2018-09-23 MED ORDER — SODIUM CHLORIDE 0.9% FLUSH: 3.0000 mL | Freq: Once | INTRAVENOUS | Status: DC

## 2018-09-23 NOTE — ED Triage Notes (Signed)
Pt to the er for dehydration. When asked to specify, pt had diaphoresis, dizziness, SOB, feeling like he was going to pass out.

## 2018-09-24 ENCOUNTER — Emergency Department
Admission: EM | Admit: 2018-09-24 | Discharge: 2018-09-24 | Disposition: A | Discharge status: Home / Self Care | Payer: Self-pay | Attending: Emergency Medicine | Admitting: Emergency Medicine

## 2018-09-24 ENCOUNTER — Other Ambulatory Visit: Payer: Self-pay

## 2018-09-24 DIAGNOSIS — R55 Syncope and collapse: Secondary | ICD-10-CM

## 2018-09-24 DIAGNOSIS — E860 Dehydration: Secondary | ICD-10-CM

## 2018-09-24 MED ORDER — SODIUM CHLORIDE 0.9 % IV BOLUS
1000.0000 mL | Freq: Once | INTRAVENOUS | Status: AC
  Administered 2018-09-24: 04:00:00 1000 mL via INTRAVENOUS

## 2018-09-24 NOTE — ED Provider Notes (Signed)
[facility] Emergency Department Provider Note  ____________________________________________  Time seen: Approximately 5:07 AM  I have reviewed the triage vital signs and the nursing notes.   HISTORY  Chief Complaint Dehydration   HPI Gabriel Waters is a 43 y.o. male with a history of diabetes and hypertension who presents for evaluation of near syncope.  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 The weather today was extremely hot (100F). Patient spent most of the day inside the truck with very little ventilation. Endorses trying to keep up with his hydration by drinking lots of water.  While inside the truck he started feeling dizzy like he was going to pass out, he broke out in a sweat, developed a headache.  He then came to the emergency room.  After receiving the air conditioning the waiting room and drinking water patient feels better.  Feeling tired at this time.  He denies any personal history of heart disease, no family history of sudden death.  He never fully syncopized.  He denies any prior history of syncope.  He is not a smoker.  No chest pain, no shortness of breath, no fever, no vomiting, no diarrhea, no abdominal pain, no headache.  Patient was here several days ago with similar symptoms and found to be dehydrated.  Past Medical History:  Diagnosis Date  . DIABETES MELLITUS, TYPE II, UNCONTROLLED 01/02/2010  . ELEVATED BLOOD PRESSURE 12/31/2009  . Hypertension   . Pancreatitis 09/13/2013  . Sleep apnea     Patient Active Problem List   Diagnosis Date Noted  . Pancreatitis, acute 09/13/2013  . Acute pancreatitis 09/13/2013  . Hypertension   . Low testosterone 06/08/2013  . OSA (obstructive sleep apnea) 04/05/2013  . Erectile dysfunction 01/04/2013  . Severe obesity (BMI >= 40) ([place]) 12/05/2012  . Hyperlipidemia 04/01/2010  . Type 2 diabetes mellitus, uncontrolled ([place]) 01/02/2010  . ABSCESS, NECK 12/31/2009  .  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 POLYDIPSIA 12/31/2009  . ELEVATED BLOOD PRESSURE 12/31/2009    Past Surgical History:  Procedure Laterality Date  . Gabriel TOOTH EXTRACTION      Prior to Admission medications   Medication Sig Start Date End Date Taking? Authorizing Provider  fluticasone (FLONASE) 50 MCG/ACT nasal spray Place 2 sprays into both nostrils daily as needed for allergies or rhinitis.    [provider]  glimepiride (AMARYL) 4 MG tablet Take 1 tablet (4 mg total) by mouth daily before breakfast. 02/03/18   Burchette, Alinda Sierras, MD  glucose blood (ACCU-CHEK AVIVA) test strip Use as instructed to check blood sugars two times daily. Dx Code E11.65 05/03/18   Burchette, Alinda Sierras, MD  ibuprofen (ADVIL,MOTRIN) 800 MG tablet Take 1 tablet (800 mg total) by mouth 3 (three) times daily. 01/28/16   Mackuen, Courteney Lyn, MD  Lancets (ACCU-CHEK MULTICLIX) lancets Use as instructed to check blood sugars two times daily. Dx Code E11.65 05/03/18   Burchette, Alinda Sierras, MD  Lancets Misc. (ACCU-CHEK MULTICLIX LANCET DEV) KIT Use as instructed to check blood sugars two times daily. Dx Code E11.65 05/04/18   Burchette, Alinda Sierras, MD  losartan (COZAAR) 50 MG tablet Take 1 tablet (50 mg total) by mouth daily. 02/03/18   Burchette, Alinda Sierras, MD  metFORMIN (GLUCOPHAGE) 500 MG tablet TAKE 1 TABLET BY MOUTH TWICE A DAY 02/03/18   Burchette, Alinda Sierras, MD  rosuvastatin (CRESTOR) 20 MG tablet Take 1 tablet (20 mg total) by mouth daily. 02/03/18   Carolann Littler  W, MD  sitaGLIPtin (JANUVIA) 100 MG tablet Take 1 tablet (100 mg total) by mouth daily. 05/18/18   Burchette, Alinda Sierras, MD    Allergies Bydureon [exenatide], Invokana [canagliflozin], Lipitor [atorvastatin], and Lisinopril  Family History  Problem Relation Age of Onset  . Diabetes Father   . Heart disease Father   . Emphysema Father   . Sarcoidosis Father   . Heart disease Mother   . Allergies Mother   . Lung cancer Mother     Social History Social  History   Tobacco Use  . Smoking status: Never Smoker  . Smokeless tobacco: Never Used  Substance Use Topics  . Alcohol use: Yes    Comment: social--2-3 glasses wine occassionally  . Drug use: No    Review of Systems  Constitutional: Negative for fever. + dizziness Eyes: Negative for visual changes. ENT: Negative for sore throat. Neck: No neck pain  Cardiovascular: Negative for chest pain. Respiratory: Negative for shortness of breath. Gastrointestinal: Negative for abdominal pain, vomiting or diarrhea. + nausea Genitourinary: Negative for dysuria. Musculoskeletal: Negative for back pain. Skin: Negative for rash. Neurological: Negative for headaches, weakness or numbness. Psych: No SI or HI  ____________________________________________   PHYSICAL EXAM:  VITAL SIGNS: ED Triage Vitals  Enc Vitals Group     BP 09/23/18 2226 119/80     Pulse Rate 09/23/18 2226 100     Resp 09/23/18 2226 18     Temp 09/23/18 2226 98.7 F (37.1 C)     Temp Source 09/23/18 2226 Oral     SpO2 09/23/18 2226 100 %     Weight 09/23/18 2215 288 lb (130.6 kg)     Height 09/23/18 2215 '6\' 2"'$  (1.88 m)     Head Circumference --      Peak Flow --      Pain Score 09/23/18 2215 0     Pain Loc --      Pain Edu? --      Excl. in [place]? --     Constitutional: Alert and oriented. Well appearing and in no apparent distress. HEENT:      Head: Normocephalic and atraumatic.         Eyes: Conjunctivae are normal. Sclera is non-icteric.       Mouth/Throat: Mucous membranes are moist.       Neck: Supple with no signs of meningismus. Cardiovascular: Regular rate and rhythm. No murmurs, gallops, or rubs. 2+ symmetrical distal pulses are present in all extremities. No JVD. Respiratory: Normal respiratory effort. Lungs are clear to auscultation bilaterally. No wheezes, crackles, or rhonchi.  Gastrointestinal: Soft, non tender, and non distended with positive bowel sounds. No rebound or guarding.  Musculoskeletal: Nontender with normal range of motion in all extremities. No edema, cyanosis, or erythema of extremities. Neurologic: Normal speech and language. Face is symmetric. Moving all extremities. No gross focal neurologic deficits are appreciated. Skin: Skin is warm, dry and intact. No rash noted. Psychiatric: Mood and affect are normal. Speech and behavior are normal.  ____________________________________________   LABS (all labs ordered are listed, but only abnormal results are displayed)  Labs Reviewed  BASIC METABOLIC PANEL - Abnormal; Notable for the following components:      Result Value   Glucose, Bld 272 (*)    Creatinine, Ser 1.38 (*)    All other components within normal limits  CBC - Abnormal; Notable for the following components:   MCV 78.1 (*)    MCH 25.9 (*)    All  other components within normal limits  URINALYSIS, COMPLETE (UACMP) WITH MICROSCOPIC - Abnormal; Notable for the following components:   Color, Urine AMBER (*)    APPearance CLOUDY (*)    Glucose, UA 150 (*)    Ketones, ur 5 (*)    Protein, ur 100 (*)    All other components within normal limits  GLUCOSE, CAPILLARY - Abnormal; Notable for the following components:   Glucose-Capillary 239 (*)    All other components within normal limits  CBG MONITORING, ED  TROPONIN I (HIGH SENSITIVITY)   ____________________________________________  EKG  ED ECG REPORT I, Rudene Re, the attending physician, personally viewed and interpreted this ECG.  Normal sinus rhythm, normal intervals, normal axis, no STE or depressions, no evidence of HOCM, AV block, delta wave, ARVD, prolonged QTc, WPW, or Brugada.  ____________________________________________  RADIOLOGY  none  ____________________________________________   PROCEDURES  Procedure(s) performed: None Procedures Critical Care performed:  None ____________________________________________   INITIAL IMPRESSION / ASSESSMENT AND PLAN /  ED COURSE  43 y.o. male with a history of diabetes and hypertension who presents for evaluation of near syncope in the setting of working hours emptying trucks at [employer] other in the 100 degree weather for most of the day.  Patient is otherwise extremely well-appearing, neurologically intact with a benign physical exam.  His EKG shows no evidence of ischemia or dysrhythmias.  He was seen here 2 days ago for the same and found to have mild AKI.  Today's creatinine is slightly worse.  He does have some ketones in his urine.  His presentation is consistent with dehydration.  Troponin is negative.  Labs showing hyperglycemia no evidence of DKA.  No evidence of anemia or other electrolyte abnormalities.  Patient received IV fluids.  He was monitored for several hours on telemetry with no evidence of dysrhythmias.  Recommended increase oral hydration with liquids that include electrolytes such as vita water or Gatorade.  Recommended several breaks during the day when it is very hot.  Recommended follow-up with PCP in 3 days to recheck his creatinine.  Discussed my standard return precautions with patient.  Patient be provided with a note for 2 days off to allow for him to stay indoors and hydrate before having labs repeated by his PCP.       As part of my medical decision making, I reviewed the following data within the [place] notes reviewed and incorporated, Labs reviewed , EKG interpreted , Old EKG reviewed, Old chart reviewed, Notes from prior ED visits and [place] Controlled Substance Database   Patient was evaluated in Emergency Department today for the symptoms described in the history of present illness. Patient was evaluated in the context of the global COVID-19 pandemic, which necessitated consideration that the patient might be at risk for infection with the SARS-CoV-2 virus that causes COVID-19. Institutional protocols and algorithms that pertain to the evaluation of  patients at risk for COVID-19 are in a state of rapid change based on information released by regulatory bodies including the CDC and federal and state organizations. These policies and algorithms were followed during the patient's care in the ED.   ____________________________________________   FINAL CLINICAL IMPRESSION(S) / ED DIAGNOSES   Final diagnoses:  Dehydration  Near syncope      NEW MEDICATIONS STARTED DURING THIS VISIT:  ED Discharge Orders    None       Note:  This document was prepared using Dragon voice recognition software and may  include unintentional dictation errors.    Alfred Levins, [place], MD 09/24/18 [phone]

## 2018-09-24 NOTE — ED Notes (Signed)
E-signature not working at this time. Pt verbalized understanding of d/c instructions. Pt in NAD at time of d/c. Pt ambulatory to lobby at this time.

## 2019-02-11 IMAGING — CR DG CHEST 2V
2 series · 2 of 2 positions shown · non-contrast
Comparison: 07/18/2014

CLINICAL DATA: Shortness of breath, chest tightness, cough

EXAM:
CHEST  2 VIEW

[chest pa]
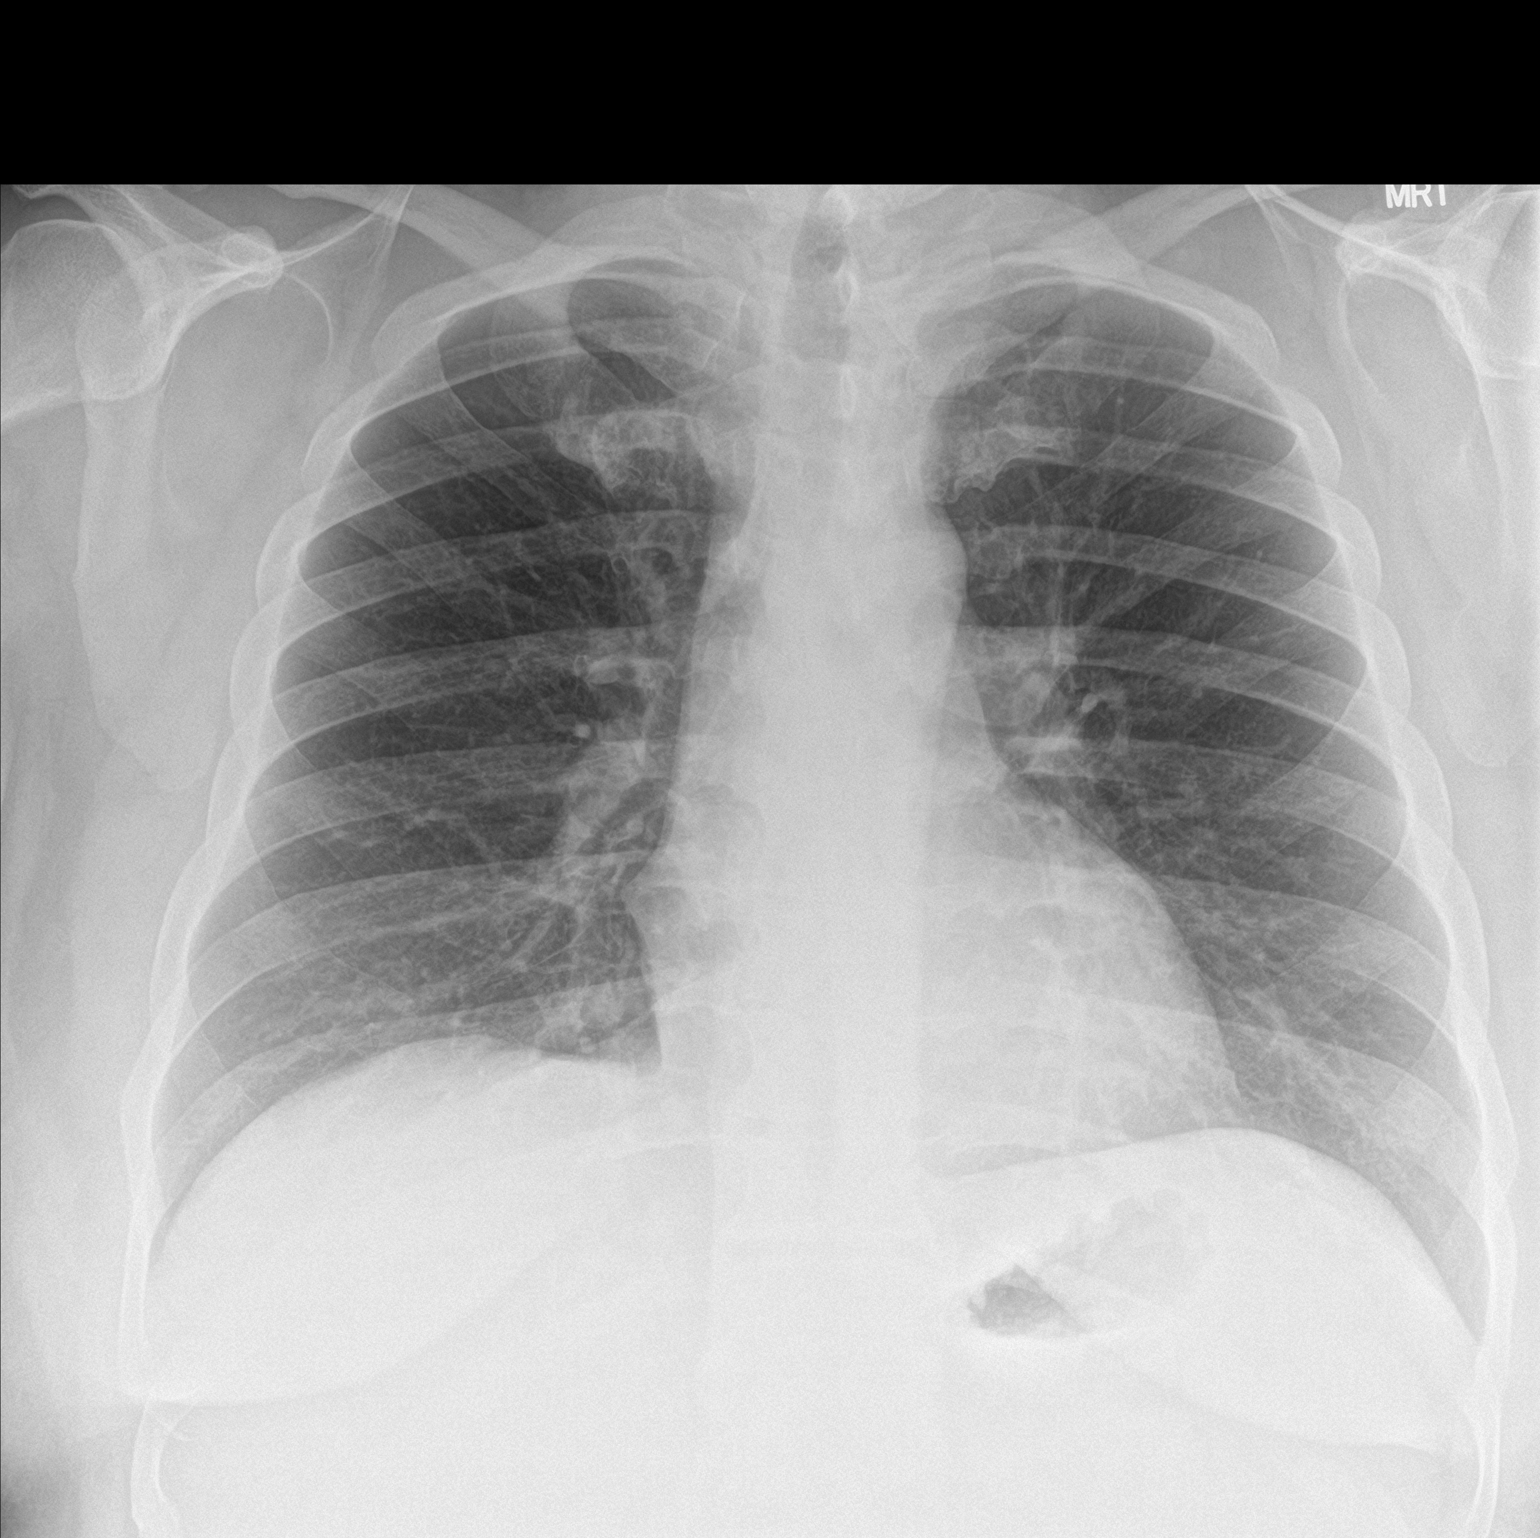

[chest lat]
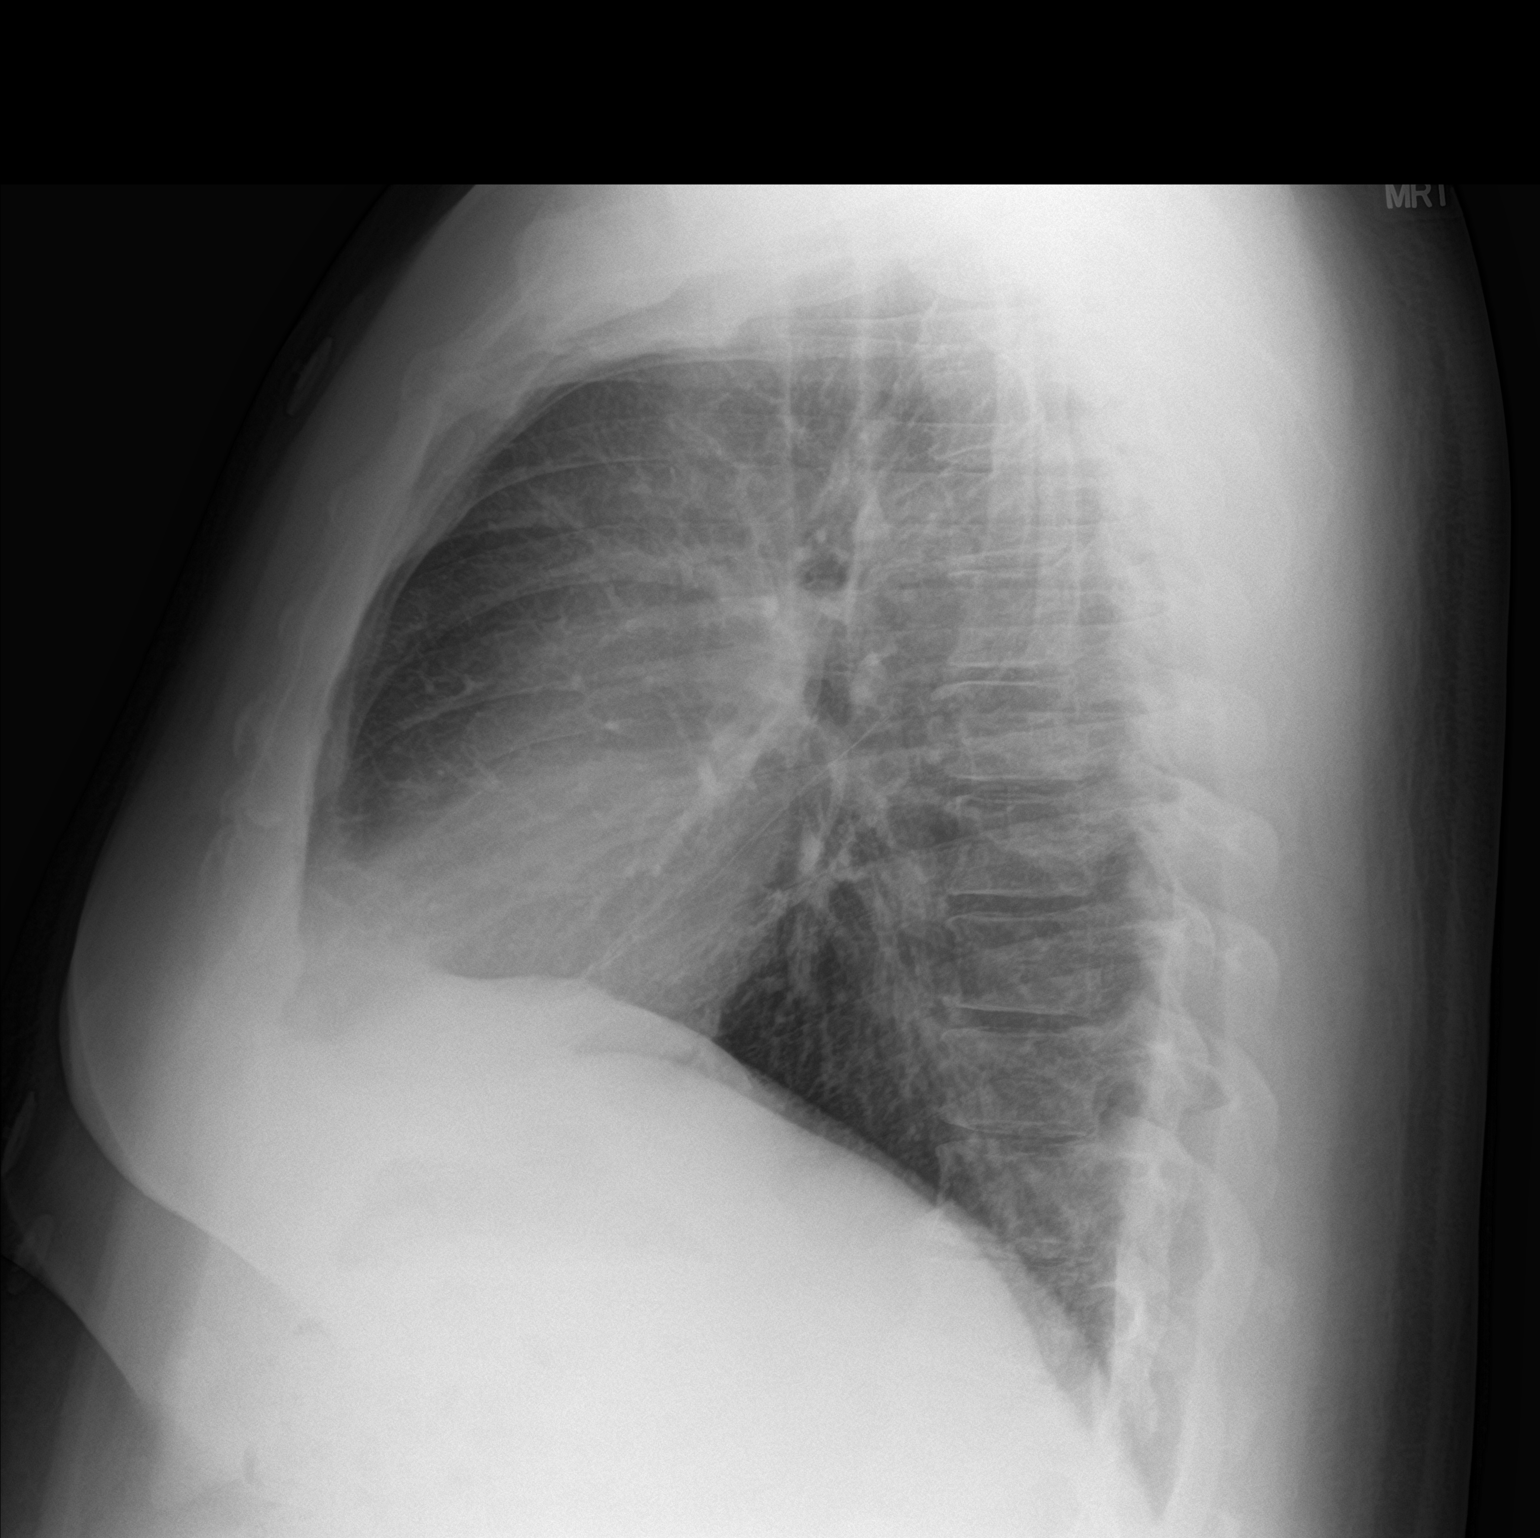

[2 of 2 positions shown; findings below may reference images not displayed]

FINDINGS: Lungs are clear.  No pleural effusion or pneumothorax.

The heart is normal in size.

Visualized osseous structures are within normal limits.
IMPRESSION: Normal chest radiographs.

## 2019-03-14 ENCOUNTER — Other Ambulatory Visit: Payer: Self-pay

## 2019-03-14 ENCOUNTER — Emergency Department: Payer: No Typology Code available for payment source

## 2019-03-14 ENCOUNTER — Inpatient Hospital Stay
Admission: EM | Admit: 2019-03-14 | Discharge: 2019-03-17 | DRG: 418 | Disposition: A | Discharge status: Home / Self Care | Payer: No Typology Code available for payment source | Attending: Internal Medicine | Admitting: Internal Medicine

## 2019-03-14 ENCOUNTER — Encounter: Payer: Self-pay | Admitting: Emergency Medicine

## 2019-03-14 DIAGNOSIS — E669 Obesity, unspecified: Secondary | ICD-10-CM | POA: Diagnosis present

## 2019-03-14 DIAGNOSIS — Z6834 Body mass index (BMI) 34.0-34.9, adult: Secondary | ICD-10-CM

## 2019-03-14 DIAGNOSIS — N179 Acute kidney failure, unspecified: Secondary | ICD-10-CM | POA: Diagnosis present

## 2019-03-14 DIAGNOSIS — K219 Gastro-esophageal reflux disease without esophagitis: Secondary | ICD-10-CM | POA: Diagnosis present

## 2019-03-14 DIAGNOSIS — E860 Dehydration: Secondary | ICD-10-CM | POA: Diagnosis present

## 2019-03-14 DIAGNOSIS — G4733 Obstructive sleep apnea (adult) (pediatric): Secondary | ICD-10-CM | POA: Diagnosis present

## 2019-03-14 DIAGNOSIS — Z833 Family history of diabetes mellitus: Secondary | ICD-10-CM | POA: Diagnosis not present

## 2019-03-14 DIAGNOSIS — E1165 Type 2 diabetes mellitus with hyperglycemia: Secondary | ICD-10-CM | POA: Diagnosis present

## 2019-03-14 DIAGNOSIS — Z79899 Other long term (current) drug therapy: Secondary | ICD-10-CM | POA: Diagnosis not present

## 2019-03-14 DIAGNOSIS — Z7984 Long term (current) use of oral hypoglycemic drugs: Secondary | ICD-10-CM

## 2019-03-14 DIAGNOSIS — Z20822 Contact with and (suspected) exposure to covid-19: Secondary | ICD-10-CM | POA: Diagnosis present

## 2019-03-14 DIAGNOSIS — K8000 Calculus of gallbladder with acute cholecystitis without obstruction: Principal | ICD-10-CM | POA: Diagnosis present

## 2019-03-14 DIAGNOSIS — I10 Essential (primary) hypertension: Secondary | ICD-10-CM | POA: Diagnosis present

## 2019-03-14 DIAGNOSIS — Z01818 Encounter for other preprocedural examination: Secondary | ICD-10-CM

## 2019-03-14 DIAGNOSIS — E876 Hypokalemia: Secondary | ICD-10-CM | POA: Diagnosis present

## 2019-03-14 DIAGNOSIS — R1011 Right upper quadrant pain: Secondary | ICD-10-CM | POA: Diagnosis present

## 2019-03-14 DIAGNOSIS — K82A1 Gangrene of gallbladder in cholecystitis: Secondary | ICD-10-CM | POA: Diagnosis present

## 2019-03-14 DIAGNOSIS — K8020 Calculus of gallbladder without cholecystitis without obstruction: Secondary | ICD-10-CM

## 2019-03-14 DIAGNOSIS — R932 Abnormal findings on diagnostic imaging of liver and biliary tract: Secondary | ICD-10-CM | POA: Diagnosis not present

## 2019-03-14 LAB — COMPREHENSIVE METABOLIC PANEL
ALT: 24 U/L (ref 0–44)
AST: 18 U/L (ref 15–41)
Albumin: 3.6 g/dL (ref 3.5–5.0)
Alkaline Phosphatase: 99 U/L (ref 38–126)
Anion gap: 15 (ref 5–15)
BUN: 21 mg/dL — ABNORMAL HIGH (ref 6–20)
CO2: 21 mmol/L — ABNORMAL LOW (ref 22–32)
Calcium: 9.1 mg/dL (ref 8.9–10.3)
Chloride: 97 mmol/L — ABNORMAL LOW (ref 98–111)
Creatinine, Ser: 1.62 mg/dL — ABNORMAL HIGH (ref 0.61–1.24)
GFR calc Af Amer: 59 mL/min — ABNORMAL LOW (ref >60)
GFR calc non Af Amer: 51 mL/min — ABNORMAL LOW (ref >60)
Glucose, Bld: 349 mg/dL — ABNORMAL HIGH (ref 70–99)
Potassium: 3.6 mmol/L (ref 3.5–5.1)
Sodium: 133 mmol/L — ABNORMAL LOW (ref 135–145)
Total Bilirubin: 4.5 mg/dL — ABNORMAL HIGH (ref 0.3–1.2)
Total Protein: 7.7 g/dL (ref 6.5–8.1)

## 2019-03-14 LAB — URINALYSIS, COMPLETE (UACMP) WITH MICROSCOPIC
Bilirubin Urine: NEGATIVE
Glucose, UA: >=500 mg/dL — AB
Ketones, ur: 20 mg/dL — AB
Leukocytes,Ua: NEGATIVE
Nitrite: NEGATIVE
Protein, ur: 30 mg/dL — AB
Specific Gravity, Urine: 1.032 — ABNORMAL HIGH (ref 1.005–1.030)
pH: 5 (ref 5.0–8.0)

## 2019-03-14 LAB — CBC
HCT: 41.2 % (ref 39.0–52.0)
Hemoglobin: 13.6 g/dL (ref 13.0–17.0)
MCH: 26.4 pg (ref 26.0–34.0)
MCHC: 33 g/dL (ref 30.0–36.0)
MCV: 79.8 fL — ABNORMAL LOW (ref 80.0–100.0)
Platelets: 301 10*3/uL (ref 150–400)
RBC: 5.16 MIL/uL (ref 4.22–5.81)
RDW: 13.3 % (ref 11.5–15.5)
WBC: 16 10*3/uL — ABNORMAL HIGH (ref 4.0–10.5)
nRBC: 0 % (ref 0.0–0.2)

## 2019-03-14 LAB — RESPIRATORY PANEL BY RT PCR (FLU A&B, COVID)
Influenza A by PCR: NEGATIVE
Influenza B by PCR: NEGATIVE
SARS Coronavirus 2 by RT PCR: NEGATIVE

## 2019-03-14 LAB — LIPASE, BLOOD: Lipase: 17 U/L (ref 11–51)

## 2019-03-14 LAB — GLUCOSE, CAPILLARY: Glucose-Capillary: 319 mg/dL — ABNORMAL HIGH (ref 70–99)

## 2019-03-14 MED ORDER — LABETALOL HCL 5 MG/ML IV SOLN: 10.0000 mg | INTRAVENOUS | Status: DC | PRN

## 2019-03-14 MED ORDER — SODIUM CHLORIDE 0.9 % IV BOLUS
1000.0000 mL | Freq: Once | INTRAVENOUS | Status: AC
  Administered 2019-03-14: 17:00:00 1000 mL via INTRAVENOUS

## 2019-03-14 MED ORDER — SODIUM CHLORIDE 0.9 % IV SOLN
2.0000 g | Freq: Once | INTRAVENOUS | Status: AC
  Administered 2019-03-14: 20:00:00 2 g via INTRAVENOUS
  Filled 2019-03-14: qty 20

## 2019-03-14 MED ORDER — PIPERACILLIN-TAZOBACTAM 3.375 G IVPB
3.3750 g | Freq: Three times a day (TID) | INTRAVENOUS | Status: DC
  Administered 2019-03-14 – 2019-03-17 (×8): 3.375 g via INTRAVENOUS
  Filled 2019-03-14 (×8): qty 50

## 2019-03-14 MED ORDER — ACETAMINOPHEN 325 MG PO TABS
650.0000 mg | ORAL_TABLET | Freq: Four times a day (QID) | ORAL | Status: DC | PRN
  Administered 2019-03-15: 650 mg via ORAL
  Filled 2019-03-14: qty 2

## 2019-03-14 MED ORDER — SODIUM CHLORIDE 0.9% FLUSH: 3.0000 mL | Freq: Once | INTRAVENOUS | Status: DC

## 2019-03-14 MED ORDER — ACETAMINOPHEN 650 MG RE SUPP: 650.0000 mg | Freq: Four times a day (QID) | RECTAL | Status: DC | PRN

## 2019-03-14 MED ORDER — ONDANSETRON HCL 4 MG/2ML IJ SOLN
4.0000 mg | Freq: Once | INTRAMUSCULAR | Status: AC
  Administered 2019-03-14: 18:00:00 4 mg via INTRAVENOUS
  Filled 2019-03-14: qty 2

## 2019-03-14 MED ORDER — SODIUM CHLORIDE 0.9 % IV SOLN: INTRAVENOUS | Status: DC

## 2019-03-14 MED ORDER — INSULIN ASPART 100 UNIT/ML ~~LOC~~ SOLN
0.0000 [IU] | SUBCUTANEOUS | Status: DC
  Administered 2019-03-14: 23:00:00 11 [IU] via SUBCUTANEOUS
  Administered 2019-03-15 (×2): 8 [IU] via SUBCUTANEOUS
  Administered 2019-03-15 (×2): 5 [IU] via SUBCUTANEOUS
  Administered 2019-03-15: 21:00:00 8 [IU] via SUBCUTANEOUS
  Administered 2019-03-15 (×2): 5 [IU] via SUBCUTANEOUS
  Administered 2019-03-16 (×2): 3 [IU] via SUBCUTANEOUS
  Administered 2019-03-16: 21:00:00 8 [IU] via SUBCUTANEOUS
  Administered 2019-03-16 (×2): 5 [IU] via SUBCUTANEOUS
  Administered 2019-03-17 (×3): 3 [IU] via SUBCUTANEOUS
  Administered 2019-03-17: 5 [IU] via SUBCUTANEOUS
  Filled 2019-03-14 (×17): qty 1

## 2019-03-14 MED ORDER — ONDANSETRON HCL 4 MG PO TABS: 4.0000 mg | ORAL_TABLET | Freq: Four times a day (QID) | ORAL | Status: DC | PRN

## 2019-03-14 MED ORDER — PIPERACILLIN-TAZOBACTAM 3.375 G IVPB 30 MIN: 3.3750 g | Freq: Three times a day (TID) | INTRAVENOUS | Status: DC

## 2019-03-14 MED ORDER — ONDANSETRON HCL 4 MG/2ML IJ SOLN
4.0000 mg | Freq: Four times a day (QID) | INTRAMUSCULAR | Status: DC | PRN
  Administered 2019-03-15: 14:00:00 4 mg via INTRAVENOUS

## 2019-03-14 NOTE — ED Notes (Signed)
Pt's wife Latitia updated. Phone handed off to EDP Derrill Kay as he is back to bedside with updates for pt.

## 2019-03-14 NOTE — ED Triage Notes (Signed)
Pt states headache, vomiting, low grade fever, shob reflux since yesterday. NAD.

## 2019-03-14 NOTE — ED Notes (Signed)
Pt's wife Dorinda Hill updated with pt's verbal okay.

## 2019-03-14 NOTE — ED Notes (Signed)
Pt notified urine sample needed. Pt agrees to notify this RN once sample ready.

## 2019-03-14 NOTE — ED Notes (Signed)
Attempted 20g at R hand.

## 2019-03-14 NOTE — Consult Note (Signed)
44 year old obese male presenting tonight with multiday history of nausea vomiting fevers and body aches.  Tender to exam reportedly in the right upper quadrant. Noted to have white blood cell count of 16, total bili of 4.5 with otherwise normal LFTs.  And associated comorbidities of hypertension and diabetes.  His glucose tonight is 300+. Suspect acute calculus cholecystitis may require surgical treatment during this admission. Requested hospitalist admission for comorbidities, IV antibiotics. Full consultation to follow in a.m.

## 2019-03-14 NOTE — H&P (Signed)
History and Physical    [place] HEN:[id number] DOB: 1975-06-14 DOA: 03/14/2019  PCP: Eulas Post, MD   Patient coming from: home  I have personally briefly reviewed patient's old medical records in [place]  Chief Complaint: Nausea vomiting and pain  HPI: Ernan Runkles is a 44 y.o. male with medical history significant for type 2 diabetes, hypertension and obstructive sleep apnea who presents to the emergency room with a 2-day history of vomiting diarrhea and abdominal pain.  He said he also had a temperature of 100 at home.  Denied cough or shortness of breath.  His vomiting was nonbloody and initially insisted of gastric contents but then it was yellow-green contents up to 5 times a day.  His diarrhea was running about twice a day.  Abdominal pain was right upper quadrant and right flank, initially crampy but then remained constant.  Described as sharp and nonradiating and of moderate to severe intensity.  ED Course: On arrival in the emergency room he had a temperature of 99.5 tachycardic at 130 with blood pressure 104/67 O2 sat was 99% on room air.  Blood work was significant for a white cell count of 16,000, creatinine of 1.62 above his baseline of 1.28 and blood sugar of 349.  The upper quadrant sonogram that showed 1.7 stone at the gallbladder neck, CBD diameter 3 mm with negative sonographic Murphy sign.  No sonographic evidence for acute cholecystitis or biliary dilatation.  The dental finding of a 3.9 cm focal hyperechoic area in the right hepatic lobe at the gallbladder fossa with mentation for outpatient evaluation with MRI when stable.  Emergency room provider spoke with surgeon Dr. Milas Gain who requested admission to hospitalist service, with plans for cholecystectomy in the a.m.  Review of Systems: As per HPI otherwise 10 point review of systems negative.    Past Medical History:  Diagnosis Date  . DIABETES MELLITUS, TYPE II, UNCONTROLLED 01/02/2010  .  ELEVATED BLOOD PRESSURE 12/31/2009  . Hypertension   . Pancreatitis 09/13/2013  . Sleep apnea     Past Surgical History:  Procedure Laterality Date  . WISDOM TOOTH EXTRACTION       reports that he has never smoked. He has never used smokeless tobacco. He reports current alcohol use. He reports that he does not use drugs.  Allergies  Allergen Reactions  . Bydureon [Exenatide] Other (See Comments)    pancreatitis  . Invokana [Canagliflozin] Other (See Comments)    Yeast balanitis  . Lipitor [Atorvastatin] Other (See Comments)    Arthralgias.  . Lisinopril Other (See Comments)    pancreatitis    Family History  Problem Relation Age of Onset  . Diabetes Father   . Heart disease Father   . Emphysema Father   . Sarcoidosis Father   . Heart disease Mother   . Allergies Mother   . Lung cancer Mother      Prior to Admission medications   Medication Sig Start Date End Date Taking? Authorizing Provider  acetaminophen (TYLENOL) 500 MG tablet Take 500-1,000 mg by mouth every 6 (six) hours as needed for mild pain or fever.   Yes [provider]  Alogliptin Benzoate 12.5 MG TABS Take 12.5 mg by mouth daily.   Yes [provider]  losartan (COZAAR) 50 MG tablet Take 1 tablet (50 mg total) by mouth daily. 02/03/18  Yes Burchette, Alinda Sierras, MD  metFORMIN (GLUCOPHAGE) 1000 MG tablet Take 1,000 mg by mouth 2 (two) times daily.   Yes  [provider]  rosuvastatin (CRESTOR) 20 MG tablet Take 1 tablet (20 mg total) by mouth daily. 02/03/18  Yes Burchette, Elberta Fortis, MD    Physical Exam: Vitals:   03/14/19 1608 03/14/19 1736  BP: 104/67   Pulse: (!) 130   Resp: 19 (!) 23  Temp: 99.5 F (37.5 C)   TempSrc: Oral   SpO2: 99%   Weight: 127.5 kg   Height: 6\' 3"  (1.905 m)      Vitals:   03/14/19 1608 03/14/19 1736  BP: 104/67   Pulse: (!) 130   Resp: 19 (!) 23  Temp: 99.5 F (37.5 C)   TempSrc: Oral   SpO2: 99%   Weight: 127.5 kg   Height: 6\' 3"  (1.905 m)      Constitutional: NAD, alert and oriented x 3 Eyes: PERRL, lids and conjunctivae normal ENMT: Mucous membranes are moist.  Neck: normal, supple, no masses, no thyromegaly Respiratory: clear to auscultation bilaterally, no wheezing, no crackles. Normal respiratory effort. No accessory muscle use.  Cardiovascular: Regular rate and rhythm, no murmurs / rubs / gallops. No extremity edema. 2+ pedal pulses. No carotid bruits.  Abdomen: tender on palpation in RUQ with guarding.  No distention. Bowel sounds positive.  Musculoskeletal: no clubbing / cyanosis. No joint deformity upper and lower extremities.  Skin: no rashes, lesions, ulcers.  Neurologic: No gross focal neurologic deficit. Psychiatric: Normal mood and affect.   Labs on Admission: I have personally reviewed following labs and imaging studies  CBC: Recent Labs  Lab 03/14/19 1610  WBC 16.0*  HGB 13.6  HCT 41.2  MCV 79.8*  PLT 301   Basic Metabolic Panel: Recent Labs  Lab 03/14/19 1610  NA 133*  K 3.6  CL 97*  CO2 21*  GLUCOSE 349*  BUN 21*  CREATININE 1.62*  CALCIUM 9.1   GFR: Estimated Creatinine Clearance: 83.7 mL/min (A) (by C-G formula based on SCr of 1.62 mg/dL (H)). Liver Function Tests: Recent Labs  Lab 03/14/19 1610  AST 18  ALT 24  ALKPHOS 99  BILITOT 4.5*  PROT 7.7  ALBUMIN 3.6   Recent Labs  Lab 03/14/19 1610  LIPASE 17   No results for input(s): AMMONIA in the last 168 hours. Coagulation Profile: No results for input(s): INR, PROTIME in the last 168 hours. Cardiac Enzymes: No results for input(s): CKTOTAL, CKMB, CKMBINDEX, TROPONINI in the last 168 hours. BNP (last 3 results) No results for input(s): PROBNP in the last 8760 hours. HbA1C: No results for input(s): HGBA1C in the last 72 hours. CBG: No results for input(s): GLUCAP in the last 168 hours. Lipid Profile: No results for input(s): CHOL, HDL, LDLCALC, TRIG, CHOLHDL, LDLDIRECT in the last 72 hours. Thyroid Function  Tests: No results for input(s): TSH, T4TOTAL, FREET4, T3FREE, THYROIDAB in the last 72 hours. Anemia Panel: No results for input(s): VITAMINB12, FOLATE, FERRITIN, TIBC, IRON, RETICCTPCT in the last 72 hours. Urine analysis:    Component Value Date/Time   COLORURINE AMBER (A) 03/14/2019 1715   APPEARANCEUR HAZY (A) 03/14/2019 1715   LABSPEC 1.032 (H) 03/14/2019 1715   PHURINE 5.0 03/14/2019 1715   GLUCOSEU >=500 (A) 03/14/2019 1715   HGBUR SMALL (A) 03/14/2019 1715   BILIRUBINUR NEGATIVE 03/14/2019 1715   KETONESUR 20 (A) 03/14/2019 1715   PROTEINUR 30 (A) 03/14/2019 1715   UROBILINOGEN 1.0 07/18/2014 1322   NITRITE NEGATIVE 03/14/2019 1715   LEUKOCYTESUR NEGATIVE 03/14/2019 1715    Radiological Exams on Admission: DG Chest Portable 1 View  Result Date: 03/14/2019 CLINICAL DATA:  Shortness of breath. EXAM: PORTABLE CHEST 1 VIEW COMPARISON:  July 01, 2016. FINDINGS: The heart size and mediastinal contours are within normal limits. Both lungs are clear. No pneumothorax or pleural effusion is noted. The visualized skeletal structures are unremarkable. IMPRESSION: No active disease. Electronically Signed   By: Lupita Raider M.D.   On: 03/14/2019 16:40   US Abdomen Limited RUQ  Result Date: 03/14/2019 CLINICAL DATA:  Right upper quadrant pain EXAM: ULTRASOUND ABDOMEN LIMITED RIGHT UPPER QUADRANT COMPARISON:  Ultrasound 09/13/2013 FINDINGS: Gallbladder: 1.7 cm stone at the gallbladder neck. Normal wall thickness. Negative sonographic Murphy Common bile duct: Diameter: 3 mm Liver: Heterogenous focal hyperechoic area within the right hepatic lobe measuring 3.3 x 3.9 x 3.1 cm portal vein is patent on color Doppler imaging with normal direction of blood flow towards the liver. Other: None. IMPRESSION: 1. Cholelithiasis without sonographic evidence for acute cholecystitis or biliary dilatation. 2. Heterogenous 3.9 cm focal hyperechoic area in the right hepatic lobe near the gallbladder fossa. This  may represent an echogenic mass versus heterogenous focal fat infiltration. When the patient is clinically stable and able to follow directions and hold their breath (preferably as an outpatient) further evaluation with dedicated abdominal MRI should be considered. Electronically Signed   By: Jasmine Pang M.D.   On: 03/14/2019 18:41    EKG: Independently reviewed. Sinus tachycardia  Assessment/Plan Principal Problem:   Acute cholecystitis due to biliary calculus --IV Zosyn 3.375 mg every 8 -Surgery aware and plans to perform cholecystectomy in the a.m.  Official consult placed -IV hydration, IV antiemetics, IV narcotics -Keep n.p.o.   AKI (acute kidney injury) (HCC)    Preoperative clearance -Patient with history of obstructive sleep apnea, diabetes and hypertension.  High functional status at baseline. -Patient presents no absolute contraindication to proceed with cholecystectomy.  Is at low risk for perioperative cardiopulmonary complications    Hyperglycemia due to type 2 diabetes mellitus (HCC) -[place] sliding scale coverage -   OSA (obstructive sleep apnea) -CPAP if desired/required    Hypertension -Blood pressure somewhat soft at 104/67. -As needed IV labetalol for systolic over 160, while n.p.o.  AbNormal ultrasound of the liver -Liver ultrasound showed 3.9 cm echogenic mass. -reCommendation for outpatient MRI      DVT prophylaxis: SCD given plan for surgery  Code Status: full code  Family Communication: none  Disposition Plan: Back to previous home environment Consults called: Surgery, dr. Eugene Gavia MD [facility]     03/14/2019, 9:43 PM

## 2019-03-14 NOTE — ED Notes (Signed)
This RN received report from [employer]. Pt to be moved to room 34 soon.

## 2019-03-14 NOTE — ED Provider Notes (Signed)
[facility] Emergency Department Provider Note  ____________________________________________   I have reviewed the triage vital signs and the nursing notes.   HISTORY  Chief Complaint Headache, Emesis, Shortness of Breath, and Gastroesophageal Reflux   History limited by: Not Limited   HPI Gabriel Waters is a 44 y.o. male who presents to the emergency department today because of concern for multiple medical complaints. The patient states that his symptoms started a couple of days ago. His symptoms include abdominal pain, reflux, nausea and vomiting, diarrhea, generalized muscle aches and shortness of breath. He has also felt like he has had sweating although no measured fevers. The patient denies any known sick contacts. He did try taking some imodium which initially helped with his diarrhea although it has since come back. He denies similar symptoms in the past.   Records reviewed. Per medical record review patient has a history of DM, HTN, pancreatitis.   Past Medical History:  Diagnosis Date  . DIABETES MELLITUS, TYPE II, UNCONTROLLED 01/02/2010  . ELEVATED BLOOD PRESSURE 12/31/2009  . Hypertension   . Pancreatitis 09/13/2013  . Sleep apnea     Patient Active Problem List   Diagnosis Date Noted  . Pancreatitis, acute 09/13/2013  . Acute pancreatitis 09/13/2013  . Hypertension   . Low testosterone 06/08/2013  . OSA (obstructive sleep apnea) 04/05/2013  . Erectile dysfunction 01/04/2013  . Severe obesity (BMI >= 40) ([place]) 12/05/2012  . Hyperlipidemia 04/01/2010  . Type 2 diabetes mellitus, uncontrolled ([place]) 01/02/2010  . ABSCESS, NECK 12/31/2009  . 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 POLYDIPSIA 12/31/2009  . ELEVATED BLOOD PRESSURE 12/31/2009    Past Surgical History:  Procedure Laterality Date  . WISDOM TOOTH EXTRACTION      Prior to Admission medications   Medication Sig Start Date End Date Taking? Authorizing Provider  fluticasone (FLONASE) 50  MCG/ACT nasal spray Place 2 sprays into both nostrils daily as needed for allergies or rhinitis.    [provider]  glimepiride (AMARYL) 4 MG tablet Take 1 tablet (4 mg total) by mouth daily before breakfast. 02/03/18   Burchette, Alinda Sierras, MD  glucose blood (ACCU-CHEK AVIVA) test strip Use as instructed to check blood sugars two times daily. Dx Code E11.65 05/03/18   Burchette, Alinda Sierras, MD  ibuprofen (ADVIL,MOTRIN) 800 MG tablet Take 1 tablet (800 mg total) by mouth 3 (three) times daily. 01/28/16   Mackuen, Courteney Lyn, MD  Lancets (ACCU-CHEK MULTICLIX) lancets Use as instructed to check blood sugars two times daily. Dx Code E11.65 05/03/18   Burchette, Alinda Sierras, MD  Lancets Misc. (ACCU-CHEK MULTICLIX LANCET DEV) KIT Use as instructed to check blood sugars two times daily. Dx Code E11.65 05/04/18   Burchette, Alinda Sierras, MD  losartan (COZAAR) 50 MG tablet Take 1 tablet (50 mg total) by mouth daily. 02/03/18   Burchette, Alinda Sierras, MD  metFORMIN (GLUCOPHAGE) 500 MG tablet TAKE 1 TABLET BY MOUTH TWICE A DAY 02/03/18   Burchette, Alinda Sierras, MD  rosuvastatin (CRESTOR) 20 MG tablet Take 1 tablet (20 mg total) by mouth daily. 02/03/18   Burchette, Alinda Sierras, MD  sitaGLIPtin (JANUVIA) 100 MG tablet Take 1 tablet (100 mg total) by mouth daily. 05/18/18   Burchette, Alinda Sierras, MD    Allergies Bydureon [exenatide], Invokana [canagliflozin], Lipitor [atorvastatin], and Lisinopril  Family History  Problem Relation Age of Onset  . Diabetes Father   . Heart disease Father   . Emphysema Father   . Sarcoidosis Father   .  Heart disease Mother   . Allergies Mother   . Lung cancer Mother     Social History Social History   Tobacco Use  . Smoking status: Never Smoker  . Smokeless tobacco: Never Used  Substance Use Topics  . Alcohol use: Yes    Comment: social--2-3 glasses wine occassionally  . Drug use: No    Review of Systems Constitutional: Positive for subjective fevers.  Eyes: No visual changes. ENT:  No sore throat. Cardiovascular: Denies chest pain. Respiratory: Positive for shortness of breath. Gastrointestinal: Positive for abdominal pain, nausea, vomiting and diarrhea.  Genitourinary: Negative for dysuria. Musculoskeletal: Positive for muscle aches. Skin: Negative for rash. Neurological: Positive for headache.  ____________________________________________   PHYSICAL EXAM:  VITAL SIGNS: ED Triage Vitals  Enc Vitals Group     BP 03/14/19 1608 104/67     Pulse Rate 03/14/19 1608 (!) 130     Resp 03/14/19 1608 19     Temp 03/14/19 1608 99.5 F (37.5 C)     Temp Source 03/14/19 1608 Oral     SpO2 03/14/19 1608 99 %     Weight 03/14/19 1608 281 lb (127.5 kg)     Height 03/14/19 1608 6' 3" (1.905 m)     Head Circumference --      Peak Flow --      Pain Score 03/14/19 1613 6   Constitutional: Alert and oriented.  Eyes: Conjunctivae are normal.  ENT      Head: Normocephalic and atraumatic.      Nose: No congestion/rhinnorhea.      Mouth/Throat: Mucous membranes are moist.      Neck: No stridor. Hematological/Lymphatic/Immunilogical: No cervical lymphadenopathy. Cardiovascular: Tachycardic, regular rhythm.  No murmurs, rubs, or gallops.  Respiratory: Normal respiratory effort without tachypnea nor retractions. Breath sounds are clear and equal bilaterally. No wheezes/rales/rhonchi. Gastrointestinal: Soft and tender to palpation in the right upper quadrant.  Genitourinary: Deferred Musculoskeletal: Normal range of motion in all extremities. No lower extremity edema. Neurologic:  Normal speech and language. No gross focal neurologic deficits are appreciated.  Skin:  Skin is warm, dry and intact. No rash noted. Psychiatric: Mood and affect are normal. Speech and behavior are normal. Patient exhibits appropriate insight and judgment.  ____________________________________________    LABS (pertinent positives/negatives)  Lipase 17 CBC wbc 16.0, hgb 13.6, plt 301 CMP na  133, k 3.6, glu 349, cr 1.62, t bili 4.5 UA hazy, small hgb dipstick, 20 ketones, 30 protein, 6-10 wbc, few bacteria COVID negative ____________________________________________   EKG  I, Nance Pear, attending physician, personally viewed and interpreted this EKG  EKG Time: 1614 Rate: 131 Rhythm: sinus tachycardia Axis: normal Intervals: qtc 443 QRS: narrow, q waves v1 ST changes: no st elevation Impression: abnormal ekg  ____________________________________________    RADIOLOGY  CXR No acute abnormality  [place] RUQ 1.7 cm gallstone in neck of gallbladder. No GBW thickening or pericholecystic fluid ____________________________________________   PROCEDURES  Procedures  ____________________________________________   INITIAL IMPRESSION / ASSESSMENT AND PLAN / ED COURSE  Pertinent labs & imaging results that were available during my care of the patient were reviewed by me and considered in my medical decision making (see chart for details).   Patient presents with concerns for nausea, vomiting, muscle aches, shortness of breath amongst other complaints.  On exam patient was tender in the right upper quadrant.  Blood work showed a leukocytosis.  I did obtain a right upper quadrant ultrasound to evaluate for gallbladder disease.  This did  show a 1.7 cm gallstone in the neck of the gallbladder although no other obvious signs of cholecystitis.  I did discuss with Dr. Christian Mate with surgery.  Plan will be to admit the patient to medicine service for further work-up and evaluation.  Discussed findings plan with patient.  ____________________________________________   FINAL CLINICAL IMPRESSION(S) / ED DIAGNOSES  Final diagnoses:  RUQ pain  Gallstones     Note: This dictation was prepared with Dragon dictation. Any transcriptional errors that result from this process are unintentional     Nance Pear, MD 03/14/19 2125

## 2019-03-15 ENCOUNTER — Inpatient Hospital Stay: Payer: No Typology Code available for payment source | Admitting: Anesthesiology

## 2019-03-15 ENCOUNTER — Encounter
Admission: EM | Disposition: A | Discharge status: Home / Self Care | Payer: Self-pay | Source: Home / Self Care | Attending: Internal Medicine

## 2019-03-15 ENCOUNTER — Encounter: Payer: Self-pay | Admitting: Internal Medicine

## 2019-03-15 DIAGNOSIS — R932 Abnormal findings on diagnostic imaging of liver and biliary tract: Secondary | ICD-10-CM

## 2019-03-15 DIAGNOSIS — K8000 Calculus of gallbladder with acute cholecystitis without obstruction: Principal | ICD-10-CM

## 2019-03-15 DIAGNOSIS — E1165 Type 2 diabetes mellitus with hyperglycemia: Secondary | ICD-10-CM

## 2019-03-15 DIAGNOSIS — N179 Acute kidney failure, unspecified: Secondary | ICD-10-CM

## 2019-03-15 HISTORY — PX: CHOLECYSTECTOMY, LAPAROSCOPIC: SHX56

## 2019-03-15 LAB — COMPREHENSIVE METABOLIC PANEL
ALT: 20 U/L (ref 0–44)
AST: 19 U/L (ref 15–41)
Albumin: 3.2 g/dL — ABNORMAL LOW (ref 3.5–5.0)
Alkaline Phosphatase: 92 U/L (ref 38–126)
Anion gap: 11 (ref 5–15)
BUN: 25 mg/dL — ABNORMAL HIGH (ref 6–20)
CO2: 25 mmol/L (ref 22–32)
Calcium: 8.7 mg/dL — ABNORMAL LOW (ref 8.9–10.3)
Chloride: 100 mmol/L (ref 98–111)
Creatinine, Ser: 1.51 mg/dL — ABNORMAL HIGH (ref 0.61–1.24)
GFR calc Af Amer: >60 mL/min (ref >60)
GFR calc non Af Amer: 55 mL/min — ABNORMAL LOW (ref >60)
Glucose, Bld: 271 mg/dL — ABNORMAL HIGH (ref 70–99)
Potassium: 3.3 mmol/L — ABNORMAL LOW (ref 3.5–5.1)
Sodium: 136 mmol/L (ref 135–145)
Total Bilirubin: 2.7 mg/dL — ABNORMAL HIGH (ref 0.3–1.2)
Total Protein: 7.1 g/dL (ref 6.5–8.1)

## 2019-03-15 LAB — GLUCOSE, CAPILLARY
Glucose-Capillary: 225 mg/dL — ABNORMAL HIGH (ref 70–99)
Glucose-Capillary: 233 mg/dL — ABNORMAL HIGH (ref 70–99)
Glucose-Capillary: 235 mg/dL — ABNORMAL HIGH (ref 70–99)
Glucose-Capillary: 245 mg/dL — ABNORMAL HIGH (ref 70–99)
Glucose-Capillary: 269 mg/dL — ABNORMAL HIGH (ref 70–99)
Glucose-Capillary: 283 mg/dL — ABNORMAL HIGH (ref 70–99)
Glucose-Capillary: 286 mg/dL — ABNORMAL HIGH (ref 70–99)

## 2019-03-15 LAB — CBC
HCT: 37.1 % — ABNORMAL LOW (ref 39.0–52.0)
Hemoglobin: 12.2 g/dL — ABNORMAL LOW (ref 13.0–17.0)
MCH: 26.3 pg (ref 26.0–34.0)
MCHC: 32.9 g/dL (ref 30.0–36.0)
MCV: 80.1 fL (ref 80.0–100.0)
Platelets: 271 10*3/uL (ref 150–400)
RBC: 4.63 MIL/uL (ref 4.22–5.81)
RDW: 13.3 % (ref 11.5–15.5)
WBC: 12.3 10*3/uL — ABNORMAL HIGH (ref 4.0–10.5)
nRBC: 0 % (ref 0.0–0.2)

## 2019-03-15 LAB — HEMOGLOBIN A1C
Hgb A1c MFr Bld: 9.3 % — ABNORMAL HIGH (ref 4.8–5.6)
Mean Plasma Glucose: 220.21 mg/dL

## 2019-03-15 LAB — HIV ANTIBODY (ROUTINE TESTING W REFLEX): HIV Screen 4th Generation wRfx: NONREACTIVE

## 2019-03-15 SURGERY — CHOLECYSTECTOMY, ROBOT-ASSISTED, LAPAROSCOPIC
Anesthesia: General | Site: Abdomen

## 2019-03-15 SURGERY — CHOLECYSTECTOMY, ROBOT-ASSISTED, LAPAROSCOPIC
Anesthesia: General

## 2019-03-15 MED ORDER — BUPIVACAINE LIPOSOME 1.3 % IJ SUSP
INTRAMUSCULAR | Status: DC | PRN
  Administered 2019-03-15: 20 mL

## 2019-03-15 MED ORDER — ACETAMINOPHEN 10 MG/ML IV SOLN
INTRAVENOUS | Status: AC
  Filled 2019-03-15: qty 100

## 2019-03-15 MED ORDER — KETOROLAC TROMETHAMINE 30 MG/ML IJ SOLN
INTRAMUSCULAR | Status: DC | PRN
  Administered 2019-03-15: 30 mg via INTRAVENOUS

## 2019-03-15 MED ORDER — PHENYLEPHRINE HCL-NACL 20-0.9 MG/250ML-% IV SOLN
INTRAVENOUS | Status: DC | PRN
  Administered 2019-03-15: 30 ug/min via INTRAVENOUS

## 2019-03-15 MED ORDER — INSULIN GLARGINE 100 UNIT/ML ~~LOC~~ SOLN
10.0000 [IU] | Freq: Every day | SUBCUTANEOUS | Status: DC
  Administered 2019-03-15: 10 [IU] via SUBCUTANEOUS
  Filled 2019-03-15 (×2): qty 0.1

## 2019-03-15 MED ORDER — MIDAZOLAM HCL 2 MG/2ML IJ SOLN
INTRAMUSCULAR | Status: DC | PRN
  Administered 2019-03-15: 2 mg via INTRAVENOUS

## 2019-03-15 MED ORDER — ROCURONIUM BROMIDE 100 MG/10ML IV SOLN
INTRAVENOUS | Status: DC | PRN
  Administered 2019-03-15: 10 mg via INTRAVENOUS
  Administered 2019-03-15: 5 mg via INTRAVENOUS
  Administered 2019-03-15: 20 mg via INTRAVENOUS
  Administered 2019-03-15: 45 mg via INTRAVENOUS
  Administered 2019-03-15: 20 mg via INTRAVENOUS

## 2019-03-15 MED ORDER — LIDOCAINE 2% (20 MG/ML) 5 ML SYRINGE
INTRAMUSCULAR | Status: DC | PRN
  Administered 2019-03-15: 100 mg via INTRAVENOUS

## 2019-03-15 MED ORDER — DEXAMETHASONE SODIUM PHOSPHATE 10 MG/ML IJ SOLN
INTRAMUSCULAR | Status: AC
  Filled 2019-03-15: qty 1

## 2019-03-15 MED ORDER — POTASSIUM CHLORIDE CRYS ER 20 MEQ PO TBCR
40.0000 meq | EXTENDED_RELEASE_TABLET | Freq: Once | ORAL | Status: AC
  Administered 2019-03-15: 13:00:00 40 meq via ORAL
  Filled 2019-03-15: qty 2

## 2019-03-15 MED ORDER — ACETAMINOPHEN 10 MG/ML IV SOLN
INTRAVENOUS | Status: DC | PRN
  Administered 2019-03-15: 1000 mg via INTRAVENOUS

## 2019-03-15 MED ORDER — PROPOFOL 10 MG/ML IV BOLUS
INTRAVENOUS | Status: DC | PRN
  Administered 2019-03-15: 150 mg via INTRAVENOUS

## 2019-03-15 MED ORDER — FENTANYL CITRATE (PF) 100 MCG/2ML IJ SOLN
INTRAMUSCULAR | Status: DC | PRN
  Administered 2019-03-15: 50 ug via INTRAVENOUS
  Administered 2019-03-15: 100 ug via INTRAVENOUS

## 2019-03-15 MED ORDER — OXYCODONE-ACETAMINOPHEN 5-325 MG PO TABS
1.0000 | ORAL_TABLET | ORAL | Status: DC | PRN
  Administered 2019-03-16 – 2019-03-17 (×4): 1 via ORAL
  Filled 2019-03-15 (×4): qty 1

## 2019-03-15 MED ORDER — INDOCYANINE GREEN 25 MG IV SOLR
1.2500 mg | Freq: Once | INTRAVENOUS | Status: AC
  Administered 2019-03-15: 1.25 mg via INTRAVENOUS

## 2019-03-15 MED ORDER — MIDAZOLAM HCL 2 MG/2ML IJ SOLN
INTRAMUSCULAR | Status: AC
  Filled 2019-03-15: qty 2

## 2019-03-15 MED ORDER — ONDANSETRON HCL 4 MG/2ML IJ SOLN
INTRAMUSCULAR | Status: AC
  Filled 2019-03-15: qty 2

## 2019-03-15 MED ORDER — CHLORHEXIDINE GLUCONATE CLOTH 2 % EX PADS
6.0000 | MEDICATED_PAD | Freq: Every day | CUTANEOUS | Status: DC
  Administered 2019-03-15: 6 via TOPICAL

## 2019-03-15 MED ORDER — FENTANYL CITRATE (PF) 250 MCG/5ML IJ SOLN
INTRAMUSCULAR | Status: AC
  Filled 2019-03-15: qty 5

## 2019-03-15 MED ORDER — PHENYLEPHRINE HCL (PRESSORS) 10 MG/ML IV SOLN
INTRAVENOUS | Status: DC | PRN
  Administered 2019-03-15 (×4): 200 ug via INTRAVENOUS

## 2019-03-15 MED ORDER — PROPOFOL 10 MG/ML IV BOLUS
INTRAVENOUS | Status: AC
  Filled 2019-03-15: qty 20

## 2019-03-15 MED ORDER — SUCCINYLCHOLINE CHLORIDE 20 MG/ML IJ SOLN
INTRAMUSCULAR | Status: DC | PRN
  Administered 2019-03-15: 100 mg via INTRAVENOUS

## 2019-03-15 MED ORDER — SUCCINYLCHOLINE CHLORIDE 20 MG/ML IJ SOLN
INTRAMUSCULAR | Status: AC
  Filled 2019-03-15: qty 1

## 2019-03-15 MED ORDER — BUPIVACAINE-EPINEPHRINE (PF) 0.25% -1:200000 IJ SOLN
INTRAMUSCULAR | Status: DC | PRN
  Administered 2019-03-15: 30 mL

## 2019-03-15 MED ORDER — SUGAMMADEX SODIUM 200 MG/2ML IV SOLN
INTRAVENOUS | Status: DC | PRN
  Administered 2019-03-15: 300 mg via INTRAVENOUS

## 2019-03-15 MED ORDER — SUGAMMADEX SODIUM 500 MG/5ML IV SOLN
INTRAVENOUS | Status: AC
  Filled 2019-03-15: qty 5

## 2019-03-15 MED ORDER — SUGAMMADEX SODIUM 200 MG/2ML IV SOLN
INTRAVENOUS | Status: AC
  Filled 2019-03-15: qty 2

## 2019-03-15 MED ORDER — FENTANYL CITRATE (PF) 100 MCG/2ML IJ SOLN: 25.0000 ug | INTRAMUSCULAR | Status: DC | PRN

## 2019-03-15 MED ORDER — PROMETHAZINE HCL 25 MG/ML IJ SOLN: 6.2500 mg | INTRAMUSCULAR | Status: DC | PRN

## 2019-03-15 MED ORDER — MORPHINE SULFATE (PF) 2 MG/ML IV SOLN: 2.0000 mg | INTRAVENOUS | Status: DC | PRN

## 2019-03-15 MED ORDER — ROCURONIUM BROMIDE 50 MG/5ML IV SOLN
INTRAVENOUS | Status: AC
  Filled 2019-03-15: qty 1

## 2019-03-15 MED ORDER — LIDOCAINE HCL (PF) 2 % IJ SOLN
INTRAMUSCULAR | Status: AC
  Filled 2019-03-15: qty 10

## 2019-03-15 SURGICAL SUPPLY — 49 items
BULB RESERV EVAC DRAIN JP 100C (MISCELLANEOUS) ×2 IMPLANT
CANISTER SUCT 1200ML W/VALVE (MISCELLANEOUS) ×4 IMPLANT
CHLORAPREP W/TINT 26 (MISCELLANEOUS) ×4 IMPLANT
CLIP VESOLOCK MED LG 6/CT (CLIP) ×4 IMPLANT
COVER TIP SHEARS 8 DVNC (MISCELLANEOUS) ×2 IMPLANT
COVER TIP SHEARS 8MM DA VINCI (MISCELLANEOUS) ×2
COVER WAND RF STERILE (DRAPES) ×4 IMPLANT
DECANTER SPIKE VIAL GLASS SM (MISCELLANEOUS) ×4 IMPLANT
DEFOGGER SCOPE WARMER CLEARIFY (MISCELLANEOUS) ×4 IMPLANT
DERMABOND ADVANCED (GAUZE/BANDAGES/DRESSINGS) ×2
DERMABOND ADVANCED .7 DNX12 (GAUZE/BANDAGES/DRESSINGS) ×2 IMPLANT
DRAIN CHANNEL JP 19F (MISCELLANEOUS) ×2 IMPLANT
DRAPE ARM DVNC X/XI (DISPOSABLE) ×8 IMPLANT
DRAPE COLUMN DVNC XI (DISPOSABLE) ×2 IMPLANT
DRAPE DA VINCI XI ARM (DISPOSABLE) ×8
DRAPE DA VINCI XI COLUMN (DISPOSABLE) ×2
DRSG TEGADERM 4X4.75 (GAUZE/BANDAGES/DRESSINGS) ×2 IMPLANT
GAUZE SPONGE 4X4 12PLY STRL (GAUZE/BANDAGES/DRESSINGS) ×2 IMPLANT
GLOVE ORTHO TXT STRL SZ7.5 (GLOVE) ×16 IMPLANT
GOWN STRL REUS W/ TWL LRG LVL3 (GOWN DISPOSABLE) ×8 IMPLANT
GOWN STRL REUS W/TWL LRG LVL3 (GOWN DISPOSABLE) ×10
GRASPER SUT TROCAR 14GX15 (MISCELLANEOUS) ×2 IMPLANT
IRRIGATION STRYKERFLOW (MISCELLANEOUS) IMPLANT
IRRIGATOR STRYKERFLOW (MISCELLANEOUS)
IRRIGATOR SUCT 8 DISP DVNC XI (IRRIGATION / IRRIGATOR) IMPLANT
IRRIGATOR SUCTION 8MM XI DISP (IRRIGATION / IRRIGATOR) ×2
IV NS IRRIG 3000ML ARTHROMATIC (IV SOLUTION) ×2 IMPLANT
KIT PINK PAD W/HEAD ARE REST (MISCELLANEOUS) ×4
KIT PINK PAD W/HEAD ARM REST (MISCELLANEOUS) ×2 IMPLANT
KIT TURNOVER KIT A (KITS) ×4 IMPLANT
LABEL OR SOLS (LABEL) ×4 IMPLANT
NDL INSUFFLATION 14GA 120MM (NEEDLE) IMPLANT
NEEDLE HYPO 22GX1.5 SAFETY (NEEDLE) ×4 IMPLANT
NEEDLE INSUFFLATION 14GA 120MM (NEEDLE) IMPLANT
NS IRRIG 500ML POUR BTL (IV SOLUTION) ×4 IMPLANT
PACK LAP CHOLECYSTECTOMY (MISCELLANEOUS) ×4 IMPLANT
POUCH SPECIMEN RETRIEVAL 10MM (ENDOMECHANICALS) ×4 IMPLANT
SEAL CANN UNIV 5-8 DVNC XI (MISCELLANEOUS) ×8 IMPLANT
SEAL XI 5MM-8MM UNIVERSAL (MISCELLANEOUS) ×8
SET TUBE SMOKE EVAC HIGH FLOW (TUBING) ×4 IMPLANT
SOLUTION ELECTROLUBE (MISCELLANEOUS) ×4 IMPLANT
SUT ETHILON 3-0 FS-10 30 BLK (SUTURE) ×4
SUT MNCRL 4-0 (SUTURE) ×2
SUT MNCRL 4-0 27XMFL (SUTURE) ×2
SUT MNCRL AB 4-0 PS2 18 (SUTURE) ×4 IMPLANT
SUT VICRYL 0 AB UR-6 (SUTURE) ×4 IMPLANT
SUTURE EHLN 3-0 FS-10 30 BLK (SUTURE) IMPLANT
SUTURE MNCRL 4-0 27XMF (SUTURE) IMPLANT
TROCAR Z-THREAD FIOS 11X100 BL (TROCAR) ×4 IMPLANT

## 2019-03-15 NOTE — Anesthesia Preprocedure Evaluation (Signed)
Anesthesia Evaluation  Patient identified by MRN, date of birth, ID band Patient awake    Reviewed: Allergy & Precautions, H&P , NPO status , Patient's Chart, lab work & pertinent test results, reviewed documented beta blocker date and time   History of Anesthesia Complications Negative for: history of anesthetic complications  Airway Mallampati: II  TM Distance: >3 FB Neck ROM: full    Dental  (+) Dental Advidsory Given, Caps, Chipped, Teeth Intact   Pulmonary neg shortness of breath, sleep apnea , neg COPD, neg recent URI,    Pulmonary exam normal        Cardiovascular Exercise Tolerance: Good hypertension, (-) angina(-) Past MI and (-) Cardiac Stents Normal cardiovascular exam(-) dysrhythmias (-) Valvular Problems/Murmurs     Neuro/Psych negative neurological ROS  negative psych ROS   GI/Hepatic negative GI ROS, Neg liver ROS,   Endo/Other  diabetes  Renal/GU Renal disease  negative genitourinary   Musculoskeletal   Abdominal   Peds  Hematology negative hematology ROS (+)   Anesthesia Other Findings Past Medical History: 01/02/2010: DIABETES MELLITUS, TYPE II, UNCONTROLLED 12/31/2009: ELEVATED BLOOD PRESSURE No date: Hypertension 09/13/2013: Pancreatitis No date: Sleep apnea   Reproductive/Obstetrics negative OB ROS                             Anesthesia Physical Anesthesia Plan  ASA: III  Anesthesia Plan: General   Post-op Pain Management:    Induction: Intravenous  PONV Risk Score and Plan: 2 and Ondansetron, Dexamethasone, Midazolam and Treatment may vary due to age or medical condition  Airway Management Planned: Oral ETT  Additional Equipment:   Intra-op Plan:   Post-operative Plan: Extubation in OR  Informed Consent: I have reviewed the patients History and Physical, chart, labs and discussed the procedure including the risks, benefits and alternatives for the  proposed anesthesia with the patient or authorized representative who has indicated his/her understanding and acceptance.     Dental Advisory Given  Plan Discussed with: Anesthesiologist, CRNA and Surgeon  Anesthesia Plan Comments:         Anesthesia Quick Evaluation

## 2019-03-15 NOTE — Transfer of Care (Signed)
Immediate Anesthesia Transfer of Care Note  Patient: Gabriel Waters  Procedure(s) Performed: XI ROBOTIC ASSISTED LAPAROSCOPIC CHOLECYSTECTOMY (N/A Abdomen) INDOCYANINE GREEN FLUORESCENCE IMAGING (ICG)  Patient Location: PACU  Anesthesia Type:General  Level of Consciousness: sedated  Airway & Oxygen Therapy: Patient Spontanous Breathing and Patient connected to face mask oxygen  Post-op Assessment: Report given to RN and Post -op Vital signs reviewed and stable  Post vital signs: Reviewed and stable  Last Vitals:  Vitals Value Taken Time  BP 118/69 03/15/19 1640  Temp 36.2 C 03/15/19 1640  Pulse 105 03/15/19 1641  Resp 19 03/15/19 1641  SpO2 100 % 03/15/19 1641  Vitals shown include unvalidated device data.  Last Pain:  Vitals:   03/15/19 1640  TempSrc: Temporal  PainSc:          Complications: No apparent anesthesia complications

## 2019-03-15 NOTE — Op Note (Signed)
Robotic cholecystectomy  Pre-operative Diagnosis: Acute calculus cholecystitis  Post-operative Diagnosis: Gangrenous cholecystitis  Procedure: Robotic assisted laparoscopic cholecystectomy.  Surgeon: Campbell Lerner, M.D., FACS  Anesthesia: General. with endotracheal tube   Findings: Gangrenous/intrahepatic gallbladder with omental encasement surrounding.  Estimated Blood Loss: 50 mL         Drains: None         Specimens: Gallbladder           Complications: none   Procedure Details  The patient was seen again in the Holding Room.  1.25 mg dose of ICG was administered intravenously.  The benefits, complications, treatment options, risks and expected outcomes were discussed with the patient. The likelihood of improving the patient's symptoms with return to their baseline status is good.  The patient and/or family concurred with the proposed plan, giving informed consent, again alternatives reviewed.  The patient was taken to Operating Room, identified, and the procedure verified as robotic assisted laparoscopic cholecystectomy.   Prior to the induction of general anesthesia, antibiotic prophylaxis was administered. VTE prophylaxis was in place. General endotracheal anesthesia was then administered and tolerated well. The patient was positioned in the supine position.  After the induction, the abdomen was prepped with Chloraprep and draped in the sterile fashion. A Time Out was held and the above information confirmed. Right peri--umbilical local infiltration with quarter percent Marcaine with epinephrine, and Exparel is utilized.  Made a 12 mm incision on the right periumbilical site, I advanced an optical 76mm port under direct visualization into the peritoneal cavity.  Once the peritoneum was penetrated, insufflation was transferred.  The trocar was then advanced into the abdominal cavity under direct visualization. Pneumoperitoneum was then continued with CO2 at 14 mmHg or less and  tolerated well without any adverse changes in the patient's vital signs.  Two 8.5-mm ports were placed in the left lower quadrant and laterally, and one to the right lower quadrant, all under direct vision. All skin incisions  were infiltrated with a local anesthetic agent before making the incision and placing the trocars.   The patient was positioned  in reverse Trendelenburg, tilted the patient's left side down.  Da Vinci XI robot was then positioned on to the patient's left side, and docked.  The gallbladder was identified.  It was distended and the fundus was well incorporated within the liver.  There was omental encasement surrounding the body and infundibulum of the gallbladder.  I utilized an angled scope and the suction irrigator and proceeded with clearing the adhesions from the body and infundibulum.  I felt it prudent to decompress the gallbladder and so with cautery scissors placed a small hole and utilize the suction to control the bile aspirate.  Then the fundus grasped via the arm 4 Prograsp and retracted cephalad. Adhesions were lysed with scissors and cautery.  The infundibulum was isolated from extensive soft tissue adhesions, grasped and retracted laterally, exposing the peritoneum overlying the triangle of Calot. This was then opened and dissected using cautery & scissors. An extended critical view of the cystic duct was obtained.  I believe the cystic artery was likely dealt with prior to exposing the triangle of Coelho utilizing bipolar forceps.  The liver parenchyma was not well identified however the common ductal system identification was not aided by the ICG via FireFly.  Either due to a lack of proximity, or due to cholestasis.  The cystic duct was clearly identified and dissected to isolation. The cystic duct was triple clipped and divided.  The gallbladder was taken from the gallbladder fossa in a retrograde fashion with the electrocautery. The gallbladder was removed and placed  in an Endocatch bag.   The liver bed was irrigated and inspected. Hemostasis was achieved with the electrocautery.  The robot was undocked and moved away from the operative field. Copious irrigation was utilized and was repeatedly aspirated until clear.  The gallbladder and Endocatch sac were then removed through the infraumbilical port site.  A 19 Blake drain is placed in the gallbladder fossa withdrawn out of the #1 port site and secured to the skin with 3-0 nylon.  Inspection of the right upper quadrant was performed. No bleeding, bile duct injury or leak, or bowel injury was noted. The peri--umbilical port site fascia was closed with interrumpted 0 Vicryl sutures using PMI/cone under direct visualization. Pneumoperitoneum was released and ports removed.  4-0 subcuticular Monocryl was used to close the skin. Dermabond was  applied.  The patient was then extubated and brought to the recovery room in stable condition. Sponge, lap, and needle counts were correct at closure and at the conclusion of the case.               Ronny Bacon, M.D., [facility] 03/15/2019 4:24 PM

## 2019-03-15 NOTE — Consult Note (Signed)
[facility] SURGICAL CONSULTATION NOTE (initial) - cpt: [id number] (Outpatient/ED)   HISTORY OF PRESENT ILLNESS (HPI):  44 y.o. male presented to [facility] ED overnight for evaluation of abdominal pain. Patient reports a history of right sided abdominal pain over the course of the last 2-3 days. He described the pain as a crampy sensation which has become sharp and severe in nature. This has been fairly constant since the onset. Additionally he reports associated nausea, emesis, diarrhea, and a subjective fever to 100 at home. His emesis is described as bilious in appearance and his diarrhea has been watery and non-bloody. No other associated symptoms. No history of similar presentation or pain in the past. No previous abdominal surgeries. Work up in the ED was concerning for leukocytosis to 16K, acute on chronic kidney injury with sCr of 1.62 (baseline ~1.20), hyperglycemia to 300+, and RUQ [place] was concerning for acute calculus cholecystitis. He was admitted to the medicine service given his medical comorbid conditions.   Surgery is consulted by emergency medicine physician Dr. Gilberto Better, MD in this context for evaluation and management of possible cholecystitis.   PAST MEDICAL HISTORY (PMH):  Past Medical History:  Diagnosis Date  . DIABETES MELLITUS, TYPE II, UNCONTROLLED 01/02/2010  . ELEVATED BLOOD PRESSURE 12/31/2009  . Hypertension   . Pancreatitis 09/13/2013  . Sleep apnea      PAST SURGICAL HISTORY [facility]):  Past Surgical History:  Procedure Laterality Date  . WISDOM TOOTH EXTRACTION       MEDICATIONS:  Prior to Admission medications   Medication Sig Start Date End Date Taking? Authorizing Provider  acetaminophen (TYLENOL) 500 MG tablet Take 500-1,000 mg by mouth every 6 (six) hours as needed for mild pain or fever.   Yes [provider]  Alogliptin Benzoate 12.5 MG TABS Take 12.5 mg by mouth daily.   Yes [provider]  losartan (COZAAR) 50 MG  tablet Take 1 tablet (50 mg total) by mouth daily. 02/03/18  Yes Burchette, Alinda Sierras, MD  metFORMIN (GLUCOPHAGE) 1000 MG tablet Take 1,000 mg by mouth 2 (two) times daily.   Yes [provider]  rosuvastatin (CRESTOR) 20 MG tablet Take 1 tablet (20 mg total) by mouth daily. 02/03/18  Yes Burchette, Alinda Sierras, MD     ALLERGIES:  Allergies  Allergen Reactions  . Bydureon [Exenatide] Other (See Comments)    pancreatitis  . Invokana [Canagliflozin] Other (See Comments)    Yeast balanitis  . Lipitor [Atorvastatin] Other (See Comments)    Arthralgias.  . Lisinopril Other (See Comments)    pancreatitis     SOCIAL HISTORY:  Social History   Socioeconomic History  . Marital status: Married    Spouse name: Not on file  . Number of children: Not on file  . Years of education: Not on file  . Highest education level: Not on file  Occupational History  . Occupation: Software engineer: [employer]  Tobacco Use  . Smoking status: Never Smoker  . Smokeless tobacco: Never Used  Substance and Sexual Activity  . Alcohol use: Yes    Comment: social--2-3 glasses wine occassionally  . Drug use: No  . Sexual activity: Not on file  Other Topics Concern  . Not on file  Social History Narrative  . Not on file   Social Determinants of Health   Financial Resource Strain:   . Difficulty of Paying Living Expenses: Not on file  Food Insecurity:   . Worried About Running  Out of Food in the Last Year: Not on file  . Ran Out of Food in the Last Year: Not on file  Transportation Needs:   . Lack of Transportation (Medical): Not on file  . Lack of Transportation (Non-Medical): Not on file  Physical Activity:   . Days of Exercise per Week: Not on file  . Minutes of Exercise per Session: Not on file  Stress:   . Feeling of Stress : Not on file  Social Connections:   . Frequency of Communication with Friends and Family: Not on file  . Frequency of Social Gatherings with Friends  and Family: Not on file  . Attends Religious Services: Not on file  . Active Member of Clubs or Organizations: Not on file  . Attends Banker Meetings: Not on file  . Marital Status: Not on file  Intimate Partner Violence:   . Fear of Current or Ex-Partner: Not on file  . Emotionally Abused: Not on file  . Physically Abused: Not on file  . Sexually Abused: Not on file     FAMILY HISTORY:  Family History  Problem Relation Age of Onset  . Diabetes Father   . Heart disease Father   . Emphysema Father   . Sarcoidosis Father   . Heart disease Mother   . Allergies Mother   . Lung cancer Mother       REVIEW OF SYSTEMS:  Review of Systems  Constitutional: Positive for fever. Negative for chills.  HENT: Negative for congestion and sore throat.   Respiratory: Negative for cough and shortness of breath.   Cardiovascular: Negative for chest pain and palpitations.  Gastrointestinal: Positive for abdominal pain, diarrhea, nausea and vomiting. Negative for blood in stool and constipation.  Genitourinary: Negative for dysuria and urgency.  All other systems reviewed and are negative.   VITAL SIGNS:  Temp:  [99.2 F (37.3 C)-100.4 F (38 C)] 99.7 F (37.6 C) (02/23 0604) Pulse Rate:  [103-130] 103 (02/23 0604) Resp:  [16-23] 16 (02/23 0604) BP: (104-144)/(67-85) 136/79 (02/23 0604) SpO2:  [96 %-100 %] 97 % (02/23 0604) Weight:  [125.4 kg-127.5 kg] 125.4 kg (02/22 2259)     Height: 6\' 3"  (190.5 cm) Weight: 125.4 kg BMI (Calculated): 34.56   INTAKE/OUTPUT:  02/22 0701 - 02/23 0700 In: 576.5 [I.V.:529.4; IV Piggyback:47.2] Out: 0   PHYSICAL EXAM:  Physical Exam Vitals and nursing note reviewed.  Constitutional:      General: He is not in acute distress.    Appearance: Normal appearance. He is obese. He is not ill-appearing.  HENT:     Head: Normocephalic and atraumatic.  Eyes:     General: No scleral icterus.    Conjunctiva/sclera: Conjunctivae normal.   Cardiovascular:     Rate and Rhythm: Normal rate and regular rhythm.     Pulses: Normal pulses.     Heart sounds: No murmur.  Pulmonary:     Effort: Pulmonary effort is normal. No respiratory distress.     Breath sounds: Normal breath sounds.  Abdominal:     General: Abdomen is flat. There is no distension.     Palpations: Abdomen is soft.     Tenderness: There is abdominal tenderness in the right upper quadrant. There is no guarding or rebound. Positive signs include Murphy's sign. Negative signs include McBurney's sign.  Genitourinary:    Comments: Deferred Musculoskeletal:     Right lower leg: No edema.     Left lower leg: No edema.  Skin:  General: Skin is warm and dry.     Coloration: Skin is not pale.     Findings: No erythema.  Neurological:     General: No focal deficit present.     Mental Status: He is alert and oriented to person, place, and time.  Psychiatric:        Mood and Affect: Mood normal.        Behavior: Behavior normal.      Labs:  CBC Latest Ref Rng & Units 03/15/2019 03/14/2019 09/23/2018  WBC 4.0 - 10.5 K/uL 12.3(H) 16.0(H) 8.3  Hemoglobin 13.0 - 17.0 g/dL 12.2(L) 13.6 13.4  Hematocrit 39.0 - 52.0 % 37.1(L) 41.2 40.4  Platelets 150 - 400 K/uL 271 301 304   CMP Latest Ref Rng & Units 03/15/2019 03/14/2019 09/23/2018  Glucose 70 - 99 mg/dL 244(W) 102(V) 253(G)  BUN 6 - 20 mg/dL 64(Q) 03(K) 19  Creatinine 0.61 - 1.24 mg/dL 7.42(V) 9.56(L) 8.75(I)  Sodium 135 - 145 mmol/L 136 133(L) 135  Potassium 3.5 - 5.1 mmol/L 3.3(L) 3.6 4.3  Chloride 98 - 111 mmol/L 100 97(L) 99  CO2 22 - 32 mmol/L 25 21(L) 25  Calcium 8.9 - 10.3 mg/dL 4.3(P) 9.1 9.4  Total Protein 6.5 - 8.1 g/dL 7.1 7.7 -  Total Bilirubin 0.3 - 1.2 mg/dL 2.7(H) 4.5(H) -  Alkaline Phos 38 - 126 U/L 92 99 -  AST 15 - 41 U/L 19 18 -  ALT 0 - 44 U/L 20 24 -     Imaging studies:   RUQ [place] (03/14/2019) personally reviewed with cholelithiasis, and radiologist report reviewed below:   IMPRESSION: 1. Cholelithiasis without sonographic evidence for acute cholecystitis or biliary dilatation. 2. Heterogenous 3.9 cm focal hyperechoic area in the right hepatic lobe near the gallbladder fossa. This may represent an echogenic mass versus heterogenous focal fat infiltration. When the patient is clinically stable and able to follow directions and hold their breath (preferably as an outpatient) further evaluation with dedicated abdominal MRI should be considered.   Assessment/Plan: (ICD-10's: K81.00) 44 y.o. male with leukocytosis, RUQ pain, and cholelithiasis on RUQ [place] concerning for acute calculus cholecystitis, complicated by pertinent comorbidities including uncontrolled T2DM.   - Appreciate medicine admission  - NPO + IVF Resuscitation  - IV ABx (Zosyn)   - Pain control prn; antiemetics prn  - Monitor abdominal examination  - Will plan on Robotic Cholecystectomy with Dr Claudine Mouton this afternoon pending OR/Anesthesia availability  - All risks, benefits, and alternatives to above procedure(s) were discussed with the patient, all of his questions were answered to his expressed satisfaction, patient expresses he wishes to proceed, and informed consent was obtained.  - Further management per primary service; we will follow  - DVT prophylaxis; hold for OR  All of the above findings and recommendations were discussed with the patient, and all of patient's questions were answered to his expressed satisfaction.  Thank you for the opportunity to participate in this patient's care.   -- Lynden Oxford, PA-C [facility] 03/15/2019, 7:32 AM [phone] M-F: 7am - 4pm

## 2019-03-15 NOTE — Anesthesia Postprocedure Evaluation (Signed)
Anesthesia Post Note  Patient: [name]  Procedure(s) Performed: XI ROBOTIC ASSISTED LAPAROSCOPIC CHOLECYSTECTOMY (N/A Abdomen) INDOCYANINE GREEN FLUORESCENCE IMAGING (ICG)  Patient location during evaluation: PACU Anesthesia Type: General Level of consciousness: awake and alert and oriented Pain management: pain level controlled Vital Signs Assessment: post-procedure vital signs reviewed and stable Respiratory status: spontaneous breathing Cardiovascular status: blood pressure returned to baseline Anesthetic complications: no     Last Vitals:  Vitals:   03/15/19 1741 03/15/19 1823  BP: 125/73 131/77  Pulse: 99 98  Resp: 16   Temp: 36.9 C   SpO2: 94% 96%    Last Pain:  Vitals:   03/15/19 1741  TempSrc: Oral  PainSc:                  Nishawn Rotan

## 2019-03-15 NOTE — Progress Notes (Signed)
[facility] [place] NAME: Gabriel Waters    MR#:  [id number]  DATE OF BIRTH:  1975/05/04  SUBJECTIVE:   Patient came in with significant abdominal pain right upper quadrant and epigastric with nausea and vomiting. Found to have stolen the neck of the gallbladder. Still continues to have pain 6/10. Awaiting surgery this afternoon.  Low-grade fever. REVIEW OF SYSTEMS:   Review of Systems  Constitutional: Negative for chills, fever and weight loss.  HENT: Negative for ear discharge, ear pain and nosebleeds.   Eyes: Negative for blurred vision, pain and discharge.  Respiratory: Negative for sputum production, shortness of breath, wheezing and stridor.   Cardiovascular: Negative for chest pain, palpitations, orthopnea and PND.  Gastrointestinal: Positive for abdominal pain and nausea. Negative for diarrhea and vomiting.  Genitourinary: Negative for frequency and urgency.  Musculoskeletal: Negative for back pain and joint pain.  Neurological: Negative for sensory change, speech change, focal weakness and weakness.  Psychiatric/Behavioral: Negative for depression and hallucinations. The patient is not nervous/anxious.    Tolerating Diet:npo Tolerating PT: not needed  DRUG ALLERGIES:   Allergies  Allergen Reactions  . Bydureon [Exenatide] Other (See Comments)    pancreatitis  . Invokana [Canagliflozin] Other (See Comments)    Yeast balanitis  . Lipitor [Atorvastatin] Other (See Comments)    Arthralgias.  . Lisinopril Other (See Comments)    pancreatitis    VITALS:  Blood pressure 126/75, pulse (!) 102, temperature 99 F (37.2 C), temperature source Oral, resp. rate 16, height 6\' 3"  (1.905 m), weight 125.4 kg, SpO2 97 %.  PHYSICAL EXAMINATION:   Physical Exam  GENERAL:  44 y.o.-year-old patient lying in the bed with no acute distress. obese EYES: Pupils equal, round, reactive to light and accommodation. No scleral icterus.   HEENT:  Head atraumatic, normocephalic. Oropharynx and nasopharynx clear.  NECK:  Supple, no jugular venous distention. No thyroid enlargement, no tenderness.  LUNGS: Normal breath sounds bilaterally, no wheezing, rales, rhonchi. No use of accessory muscles of respiration.  CARDIOVASCULAR: S1, S2 normal. No murmurs, rubs, or gallops.  ABDOMEN: Soft, + tenderness in right UQ, nondistended. Bowel sounds present. No organomegaly or mass.  EXTREMITIES: No cyanosis, clubbing or edema b/l.    NEUROLOGIC: Cranial nerves II through XII are intact. No focal Motor or sensory deficits b/l.   PSYCHIATRIC:  patient is alert and oriented x 3.  SKIN: No obvious rash, lesion, or ulcer.   LABORATORY PANEL:  CBC Recent Labs  Lab 03/15/19 0339  WBC 12.3*  HGB 12.2*  HCT 37.1*  PLT 271    Chemistries  Recent Labs  Lab 03/15/19 0339  NA 136  K 3.3*  CL 100  CO2 25  GLUCOSE 271*  BUN 25*  CREATININE 1.51*  CALCIUM 8.7*  AST 19  ALT 20  ALKPHOS 92  BILITOT 2.7*   Cardiac Enzymes No results for input(s): TROPONINI in the last 168 hours. RADIOLOGY:  DG Chest Portable 1 View  Result Date: 03/14/2019 CLINICAL DATA:  Shortness of breath. EXAM: PORTABLE CHEST 1 VIEW COMPARISON:  July 01, 2016. FINDINGS: The heart size and mediastinal contours are within normal limits. Both lungs are clear. No pneumothorax or pleural effusion is noted. The visualized skeletal structures are unremarkable. IMPRESSION: No active disease. Electronically Signed   By: Marijo Conception M.D.   On: 03/14/2019 16:40   US Abdomen Limited RUQ  Result Date: 03/14/2019 CLINICAL DATA:  Right upper quadrant pain EXAM:  ULTRASOUND ABDOMEN LIMITED RIGHT UPPER QUADRANT COMPARISON:  Ultrasound 09/13/2013 FINDINGS: Gallbladder: 1.7 cm stone at the gallbladder neck. Normal wall thickness. Negative sonographic Murphy Common bile duct: Diameter: 3 mm Liver: Heterogenous focal hyperechoic area within the right hepatic lobe measuring 3.3 x 3.9 x 3.1  cm portal vein is patent on color Doppler imaging with normal direction of blood flow towards the liver. Other: None. IMPRESSION: 1. Cholelithiasis without sonographic evidence for acute cholecystitis or biliary dilatation. 2. Heterogenous 3.9 cm focal hyperechoic area in the right hepatic lobe near the gallbladder fossa. This may represent an echogenic mass versus heterogenous focal fat infiltration. When the patient is clinically stable and able to follow directions and hold their breath (preferably as an outpatient) further evaluation with dedicated abdominal MRI should be considered. Electronically Signed   By: Jasmine Pang M.D.   On: 03/14/2019 18:41   ASSESSMENT AND PLAN:  Gabriel Waters is a 44 y.o. male with medical history significant for type 2 diabetes, hypertension and obstructive sleep apnea who presents to the emergency room with a 2-day history of vomiting diarrhea and abdominal pain.  He said he also had a temperature of 100 at home.  Denied cough or shortness of breath  Acute cholecystitis due to biliary calculus (gall bladder neck) --IV Zosyn 3.375 mg every 8 hourly -Surgery aware and plans to perform cholecystectomy today.   -IV hydration, IV antiemetics, IV narcotics -Keep n.p.o.  AKI (acute kidney injury) (HCC) appears prerenal azotemia/dehydration due to vomiting-  -baseline creatinine .8 -came in with creatinine of 1.62-- IV fluids--1.5 -monitor input output    Hyperglycemia due to type 2 diabetes mellitus (HCC) -Saline sliding scale coverage -will start on Lantus 10 units daily. -Appreciate diabetes coordinator recommendation-- metformin and glipizide at discharge    OSA (obstructive sleep apnea) -CPAP if desired/required -defer to primary care physician    Hypertension -Blood pressure somewhat soft at 104/67. -As needed IV labetalol for systolic over 160, while n.p.o.   AbNormal ultrasound of the liver -Liver ultrasound showed 3.9 cm echogenic  mass. -reCommendation for outpatient MRI--defer to PCP    DVT prophylaxis: SCD given plan for surgery  Code Status: full code  Family Communication: none  Disposition Plan: Back to previous home environment Consults called: Surgery, dr. Claudine Mouton    TOTAL TIME TAKING CARE OF THIS PATIENT: *30 minutes.  >50% time spent on counselling and coordination of care  Note: This dictation was prepared with Dragon dictation along with smaller phrase technology. Any transcriptional errors that result from this process are unintentional.  Enedina Finner M.D    [facility]   CC: Primary care physician; Patient, No Pcp PerPatient ID: [name], male   DOB: [birth year], 44 y.o.   MRN: [id number]

## 2019-03-15 NOTE — Anesthesia Procedure Notes (Signed)
Procedure Name: Intubation Date/Time: 03/15/2019 2:15 PM Performed by: Paulette Blanch, CRNA Pre-anesthesia Checklist: Patient identified, Patient being monitored, Timeout performed, Emergency Drugs available and Suction available Patient Re-evaluated:Patient Re-evaluated prior to induction Oxygen Delivery Method: Circle system utilized Preoxygenation: Pre-oxygenation with 100% oxygen Induction Type: IV induction Ventilation: Mask ventilation without difficulty Laryngoscope Size: 3 and Miller Grade View: Grade I Tube type: Oral Tube size: 7.5 mm Number of attempts: 1 Placement Confirmation: ETT inserted through vocal cords under direct vision,  positive ETCO2 and breath sounds checked- equal and bilateral Secured at: 21 cm Tube secured with: Tape Dental Injury: Teeth and Oropharynx as per pre-operative assessment

## 2019-03-15 NOTE — Progress Notes (Signed)
Initial Nutrition Assessment  DOCUMENTATION CODES:   Obesity unspecified  INTERVENTION:   RD will order supplements once diet advanced if needed  NUTRITION DIAGNOSIS:   Inadequate oral intake related to acute illness as evidenced by NPO status.  GOAL:   Patient will meet greater than or equal to 90% of their needs  MONITOR:   PO intake, Supplement acceptance, Labs, Weight trends, Skin, I & O's  REASON FOR ASSESSMENT:   Malnutrition Screening Tool    ASSESSMENT:   44 y.o. male with medical history significant for type 2 diabetes, hypertension, pancreatitis and obstructive sleep apnea who presents to the emergency room with a 2-day history of vomiting diarrhea and abdominal pain. Pt found to have Acute cholecystitis due to biliary calculus and liver mass   Pt with poor appetite and oral intake for 1 day pta r/t nausea, vomiting and abdominal pain. Pt currently NPO for lap chole today. RD will add supplements once diet advanced. Per chart, pt is down 12lbs(4%) from his UBW; RD unsure how recently weight loss occurred.   Would recommend outpatient diabetes counseling at the [facility] after discharge.    Medications reviewed and include: insulin, NaCl @100ml /hr, zosyn  Labs reviewed: K 3.3(L), BUN 25(H), creat 1.51(H) Wbc- 12.3(H) cbgs- 283, 245, 233 x 24 hrs AIC 9.3(H)- 2/22  NUTRITION - FOCUSED PHYSICAL EXAM:    Most Recent Value  Orbital Region  No depletion  Upper Arm Region  No depletion  Thoracic and Lumbar Region  No depletion  Buccal Region  No depletion  Temple Region  No depletion  Clavicle Bone Region  No depletion  Clavicle and Acromion Bone Region  No depletion  Scapular Bone Region  No depletion  Dorsal Hand  No depletion  Patellar Region  No depletion  Anterior Thigh Region  No depletion  Posterior Calf Region  No depletion  Edema (RD Assessment)  None  Hair  Reviewed  Eyes  Reviewed  Mouth  Reviewed  Skin  Reviewed  Nails  Reviewed      Diet Order:   Diet Order            Diet NPO time specified  Diet effective now             EDUCATION NEEDS:   No education needs have been identified at this time  Skin:  Skin Assessment: Reviewed RN Assessment  Last BM:  2/22  Height:   Ht Readings from Last 1 Encounters:  03/14/19 6\' 3"  (1.905 m)    Weight:   Wt Readings from Last 1 Encounters:  03/14/19 125.4 kg    Ideal Body Weight:  89 kg  BMI:  Body mass index is 34.56 kg/m.  Estimated Nutritional Needs:   Kcal:  2700-3000kcal/day  Protein:  >135g/day  Fluid:  >2.7L/day  MS, RD, LDN Contact information available in Amion

## 2019-03-15 NOTE — Progress Notes (Addendum)
Inpatient Diabetes Program Recommendations  AACE/ADA: New Consensus Statement on Inpatient Glycemic Control (2015)  Target Ranges:  Prepandial:   less than 140 mg/dL      Peak postprandial:   less than 180 mg/dL (1-2 hours)      Critically ill patients:  140 - 180 mg/dL   Lab Results  Component Value Date   GLUCAP 233 (H) 03/15/2019   HGBA1C 9.3 (H) 03/14/2019    Review of Glycemic Control Results for BARTH, TRELLA (MRN [id number]) as of 03/15/2019 09:03  Ref. Range 03/14/2019 22:07 03/15/2019 00:02 03/15/2019 04:43 03/15/2019 07:50  Glucose-Capillary Latest Ref Range: 70 - 99 mg/dL 827 (H) 078 (H) 675 (H) 233 (H)   Diabetes history: DM 2 Outpatient Diabetes medications: Metformin 1000 mg bid Current orders for Inpatient glycemic control:  Novolog 0-15 units Q4 hours  A1c 9.3% on 2/22  Inpatient Diabetes Program Recommendations:    While inpatient and NPO consider Lantus 10 units.  Patient will benefit from a second oral agent at time of d/c and a glucose meter if he does not already have one.  Plan on seeing pt today or tomorrow depending on possible intervention/procedure.  Addendum 11 am:  Spoke with pt at bedside to discuss A1c and glucose control at home. Pt reports needing a new lancet device. Pt also stated his doctor wanted to start him on Januvia, however pt needs prior auth. for this med. Discussed glucose and A1dc goals. Briefly reminded pt about lifestyle modifications. Called pt insurance to see what medications are preferred.   Consider Glipizide 5 mg Daily (2 month supply) at time of d/c along with lancet device (order # [phone]).  Thanks,  Christena Deem RN, MSN, BC-ADM Inpatient Diabetes Coordinator Team Pager [phone] (8a-5p)

## 2019-03-16 LAB — COMPREHENSIVE METABOLIC PANEL
ALT: 30 U/L (ref 0–44)
AST: 39 U/L (ref 15–41)
Albumin: 2.5 g/dL — ABNORMAL LOW (ref 3.5–5.0)
Alkaline Phosphatase: 90 U/L (ref 38–126)
Anion gap: 6 (ref 5–15)
BUN: 20 mg/dL (ref 6–20)
CO2: 24 mmol/L (ref 22–32)
Calcium: 7.9 mg/dL — ABNORMAL LOW (ref 8.9–10.3)
Chloride: 109 mmol/L (ref 98–111)
Creatinine, Ser: 1.21 mg/dL (ref 0.61–1.24)
GFR calc Af Amer: >60 mL/min (ref >60)
GFR calc non Af Amer: >60 mL/min (ref >60)
Glucose, Bld: 228 mg/dL — ABNORMAL HIGH (ref 70–99)
Potassium: 3.2 mmol/L — ABNORMAL LOW (ref 3.5–5.1)
Sodium: 139 mmol/L (ref 135–145)
Total Bilirubin: 1.4 mg/dL — ABNORMAL HIGH (ref 0.3–1.2)
Total Protein: 5.9 g/dL — ABNORMAL LOW (ref 6.5–8.1)

## 2019-03-16 LAB — CBC
HCT: 31.8 % — ABNORMAL LOW (ref 39.0–52.0)
Hemoglobin: 10.4 g/dL — ABNORMAL LOW (ref 13.0–17.0)
MCH: 26.3 pg (ref 26.0–34.0)
MCHC: 32.7 g/dL (ref 30.0–36.0)
MCV: 80.5 fL (ref 80.0–100.0)
Platelets: 248 10*3/uL (ref 150–400)
RBC: 3.95 MIL/uL — ABNORMAL LOW (ref 4.22–5.81)
RDW: 13.3 % (ref 11.5–15.5)
WBC: 7.3 10*3/uL (ref 4.0–10.5)
nRBC: 0 % (ref 0.0–0.2)

## 2019-03-16 LAB — MAGNESIUM: Magnesium: 2 mg/dL (ref 1.7–2.4)

## 2019-03-16 LAB — GLUCOSE, CAPILLARY
Glucose-Capillary: 198 mg/dL — ABNORMAL HIGH (ref 70–99)
Glucose-Capillary: 161 mg/dL — ABNORMAL HIGH (ref 70–99)
Glucose-Capillary: 235 mg/dL — ABNORMAL HIGH (ref 70–99)
Glucose-Capillary: 245 mg/dL — ABNORMAL HIGH (ref 70–99)
Glucose-Capillary: 287 mg/dL — ABNORMAL HIGH (ref 70–99)

## 2019-03-16 MED ORDER — ENSURE MAX PROTEIN PO LIQD
11.0000 [oz_av] | Freq: Two times a day (BID) | ORAL | Status: DC
  Administered 2019-03-16 – 2019-03-17 (×3): 11 [oz_av] via ORAL
  Filled 2019-03-16: qty 330

## 2019-03-16 MED ORDER — INSULIN GLARGINE 100 UNIT/ML ~~LOC~~ SOLN
20.0000 [IU] | Freq: Every day | SUBCUTANEOUS | Status: DC
  Administered 2019-03-16 – 2019-03-17 (×2): 20 [IU] via SUBCUTANEOUS
  Filled 2019-03-16 (×3): qty 0.2

## 2019-03-16 MED ORDER — LOSARTAN POTASSIUM 50 MG PO TABS
50.0000 mg | ORAL_TABLET | Freq: Every day | ORAL | Status: DC
  Administered 2019-03-17: 50 mg via ORAL
  Filled 2019-03-16: qty 1

## 2019-03-16 MED ORDER — LINAGLIPTIN 5 MG PO TABS
5.0000 mg | ORAL_TABLET | Freq: Every day | ORAL | Status: DC
  Administered 2019-03-17: 10:00:00 5 mg via ORAL
  Filled 2019-03-16: qty 1

## 2019-03-16 MED ORDER — ENOXAPARIN SODIUM 40 MG/0.4ML ~~LOC~~ SOLN
40.0000 mg | SUBCUTANEOUS | Status: DC
  Administered 2019-03-16: 22:00:00 40 mg via SUBCUTANEOUS
  Filled 2019-03-16: qty 0.4

## 2019-03-16 MED ORDER — POTASSIUM CHLORIDE CRYS ER 20 MEQ PO TBCR
40.0000 meq | EXTENDED_RELEASE_TABLET | Freq: Once | ORAL | Status: AC
  Administered 2019-03-16: 40 meq via ORAL
  Filled 2019-03-16: qty 2

## 2019-03-16 NOTE — Consult Note (Signed)
PHARMACY CONSULT NOTE - FOLLOW UP  Pharmacy Consult for Electrolyte Monitoring and Replacement   Recent Labs: Potassium (mmol/L)  Date Value  03/16/2019 3.2 (L)   Calcium (mg/dL)  Date Value  56/15/3794 7.9 (L)   Albumin (g/dL)  Date Value  32/76/1470 2.5 (L)   Sodium (mmol/L)  Date Value  03/16/2019 139     Assessment: Pharmacy has been consulted electrolytes management. Patient has a PMH significant for HTN, diabetes who was admitted for acute cholecystitis. Patient is POD #1. Currently, patient is on continuous NS @ 100 cc/hr.  K 3.2  Mg 2.0  Scr 1.51 > 1.21   Mg pending   Goal of Therapy:  Electrolytes WNL  Plan:  Will order KCL 40 mEq x1 dose and will f/u on magnesium level later today. Will order BMP with AM labs.   Update @ 1045: Mg 2.0. WNL. No replacement needed at this time.   Katha Cabal ,PharmD Clinical Pharmacist 03/16/2019 9:04 AM

## 2019-03-16 NOTE — Progress Notes (Signed)
[facility] SURGICAL PROGRESS NOTE  Hospital Day(s): 2.   Post op day(s): 1 Day Post-Op.   Interval History:  Patient seen and examined no acute events or new complaints overnight.  Patient reports that he has abdominal soreness but this is a new pain compared to yesterday No nausea or emesis Leukocytosis resolved; now 7.3K, afebrile Previously seen hyperbilirubinemia improving; bilirubin 1.4 this morning  Surgical drain with 50 ccs out since placement On regular carb modified diet and tolerating without issue   Vital signs in last 24 hours: [min-max] current  Temp:  [97.1 F (36.2 C)-99.1 F (37.3 C)] 98.2 F (36.8 C) (02/24 0619) Pulse Rate:  [98-113] 98 (02/24 0619) Resp:  [16-23] 16 (02/24 0619) BP: (114-150)/(53-82) 150/82 (02/24 0619) SpO2:  [94 %-100 %] 97 % (02/24 0619)     Height: 6\' 3"  (190.5 cm) Weight: 125.4 kg BMI (Calculated): 34.56   Intake/Output last 2 shifts:  02/23 0701 - 02/24 0700 In: 3473.8 [I.V.:3173.8; IV Piggyback:150] Out: 1711 [Urine:1395; Drains:50; Stool:1; Blood:15]   Physical Exam:  Constitutional: alert, cooperative and no distress  Respiratory: breathing non-labored at rest  Cardiovascular: regular rate and sinus rhythm  Gastrointestinal: Obese, soft, incisional soreness, and non-distended. No rebound/guarding. JP in the right mid-abdomen, bile tinged serous fluid in bulb Integumentary: Laparoscopic incisions are CDI with dermabond, no erythema or drainage.   Labs:  CBC Latest Ref Rng & Units 03/16/2019 03/15/2019 03/14/2019  WBC 4.0 - 10.5 K/uL 7.3 12.3(H) 16.0(H)  Hemoglobin 13.0 - 17.0 g/dL 10.4(L) 12.2(L) 13.6  Hematocrit 39.0 - 52.0 % 31.8(L) 37.1(L) 41.2  Platelets 150 - 400 K/uL 248 271 301   CMP Latest Ref Rng & Units 03/16/2019 03/15/2019 03/14/2019  Glucose 70 - 99 mg/dL 03/16/2019) 119(J) 478(G)  BUN 6 - 20 mg/dL 20 956(O) 13(Y)  Creatinine 0.61 - 1.24 mg/dL 86(V 7.84) 6.96(E)  Sodium 135 - 145 mmol/L 139 136 133(L)   Potassium 3.5 - 5.1 mmol/L 3.2(L) 3.3(L) 3.6  Chloride 98 - 111 mmol/L 109 100 97(L)  CO2 22 - 32 mmol/L 24 25 21(L)  Calcium 8.9 - 10.3 mg/dL 7.9(L) 8.7(L) 9.1  Total Protein 6.5 - 8.1 g/dL 5.9(L) 7.1 7.7  Total Bilirubin 0.3 - 1.2 mg/dL 9.52(W) 2.7(H) 4.5(H)  Alkaline Phos 38 - 126 U/L 90 92 99  AST 15 - 41 U/L 39 19 18  ALT 0 - 44 U/L 30 20 24      Imaging studies: No new pertinent imaging studies   Assessment/Plan: 44 y.o. male with resolution in leukocytosis and still with mild but improved hyperbilirubinemia 1 Day Post-Op s/p robotic assisted laparoscopic cholecystectomy for gangrenous cholecystitis, complicated by pertinent comorbidities including unctrolled T2DM.   for regular (carb modified) diet  - Continue IVF resuscitation  - Continue IV ABx (Zosyn)   - pain control prn; antiemetics prn  - monitor abdominal examination; on-going bowel function   - Continue drain; monitor output   - Mobilize   - Encouraged IS use   - Further management per primary service    - Discharge Planning: Hopefully home in next 24-48 hours pending clinical condition  All of the above findings and recommendations were discussed with the patient, and the medical team, and all of patient's questions were answered to his expressed satisfaction.  -- 58, PA-C [facility] 03/16/2019, 7:35 AM [phone] M-F: 7am - 4pm

## 2019-03-16 NOTE — Progress Notes (Signed)
[facility] [place] NAME: Gabriel Waters    MR#:  [id number]  DATE OF BIRTH:  01/18/1976  SUBJECTIVE:   Patient came in with significant abdominal pain right upper quadrant and epigastric with nausea and vomiting.  Postop day one-- overall feeling better. REVIEW OF SYSTEMS:   Review of Systems  Constitutional: Negative for chills, fever and weight loss.  HENT: Negative for ear discharge, ear pain and nosebleeds.   Eyes: Negative for blurred vision, pain and discharge.  Respiratory: Negative for sputum production, shortness of breath, wheezing and stridor.   Cardiovascular: Negative for chest pain, palpitations, orthopnea and PND.  Gastrointestinal: Positive for abdominal pain and nausea. Negative for diarrhea and vomiting.  Genitourinary: Negative for frequency and urgency.  Musculoskeletal: Negative for back pain and joint pain.  Neurological: Negative for sensory change, speech change, focal weakness and weakness.  Psychiatric/Behavioral: Negative for depression and hallucinations. The patient is not nervous/anxious.    Tolerating Diet: carb control diet  tolerating PT: not needed  DRUG ALLERGIES:   Allergies  Allergen Reactions  . Bydureon [Exenatide] Other (See Comments)    pancreatitis  . Invokana [Canagliflozin] Other (See Comments)    Yeast balanitis  . Lipitor [Atorvastatin] Other (See Comments)    Arthralgias.  . Lisinopril Other (See Comments)    pancreatitis    VITALS:  Blood pressure 130/80, pulse 95, temperature 98.1 F (36.7 C), temperature source Oral, resp. rate 18, height 6\' 3"  (1.905 m), weight 125.4 kg, SpO2 97 %.  PHYSICAL EXAMINATION:   Physical Exam  GENERAL:  44 y.o.-year-old patient lying in the bed with no acute distress. obese EYES: Pupils equal, round, reactive to light and accommodation. No scleral icterus.   HEENT: Head atraumatic, normocephalic. Oropharynx and nasopharynx clear.  NECK:   Supple, no jugular venous distention. No thyroid enlargement, no tenderness.  LUNGS: Normal breath sounds bilaterally, no wheezing, rales, rhonchi. No use of accessory muscles of respiration.  CARDIOVASCULAR: S1, S2 normal. No murmurs, rubs, or gallops.  ABDOMEN: Soft, + tenderness in right UQ, nondistended. Bowel sounds present. No organomegaly or mass. Drain+ EXTREMITIES: No cyanosis, clubbing or edema b/l.    NEUROLOGIC: Cranial nerves II through XII are intact. No focal Motor or sensory deficits b/l.   PSYCHIATRIC:  patient is alert and oriented x 3.  SKIN: No obvious rash, lesion, or ulcer.   LABORATORY PANEL:  CBC Recent Labs  Lab 03/16/19 0342  WBC 7.3  HGB 10.4*  HCT 31.8*  PLT 248    Chemistries  Recent Labs  Lab 03/16/19 0342  NA 139  K 3.2*  CL 109  CO2 24  GLUCOSE 228*  BUN 20  CREATININE 1.21  CALCIUM 7.9*  MG 2.0  AST 39  ALT 30  ALKPHOS 90  BILITOT 1.4*   Cardiac Enzymes No results for input(s): TROPONINI in the last 168 hours. RADIOLOGY:  DG Chest Portable 1 View  Result Date: 03/14/2019 CLINICAL DATA:  Shortness of breath. EXAM: PORTABLE CHEST 1 VIEW COMPARISON:  July 01, 2016. FINDINGS: The heart size and mediastinal contours are within normal limits. Both lungs are clear. No pneumothorax or pleural effusion is noted. The visualized skeletal structures are unremarkable. IMPRESSION: No active disease. Electronically Signed   By: Marijo Conception M.D.   On: 03/14/2019 16:40   US Abdomen Limited RUQ  Result Date: 03/14/2019 CLINICAL DATA:  Right upper quadrant pain EXAM: ULTRASOUND ABDOMEN LIMITED RIGHT UPPER QUADRANT COMPARISON:  Ultrasound  09/13/2013 FINDINGS: Gallbladder: 1.7 cm stone at the gallbladder neck. Normal wall thickness. Negative sonographic Murphy Common bile duct: Diameter: 3 mm Liver: Heterogenous focal hyperechoic area within the right hepatic lobe measuring 3.3 x 3.9 x 3.1 cm portal vein is patent on color Doppler imaging with normal  direction of blood flow towards the liver. Other: None. IMPRESSION: 1. Cholelithiasis without sonographic evidence for acute cholecystitis or biliary dilatation. 2. Heterogenous 3.9 cm focal hyperechoic area in the right hepatic lobe near the gallbladder fossa. This may represent an echogenic mass versus heterogenous focal fat infiltration. When the patient is clinically stable and able to follow directions and hold their breath (preferably as an outpatient) further evaluation with dedicated abdominal MRI should be considered. Electronically Signed   By: Jasmine Pang M.D.   On: 03/14/2019 18:41   ASSESSMENT AND PLAN:  Gabriel Waters is a 44 y.o. male with medical history significant for type 2 diabetes, hypertension and obstructive sleep apnea who presents to the emergency room with a 2-day history of vomiting diarrhea and abdominal pain.  He said he also had a temperature of 100 at home.  Denied cough or shortness of breath  Acute cholecystitis due to biliary calculus (gall bladder neck) --IV Zosyn 3.375 mg every 8 hourly -s/p POD#1  cholecystectomy  -IV hydration, IV antiemetics, IV and po pain meds prn -carb controlled diet  AKI (acute kidney injury) (HCC) appears prerenal azotemia/dehydration due to vomiting-  -baseline creatinine .8 -came in with creatinine of 1.62-- IV fluids--1.5 -monitor input output  Hyperglycemia due to type 2 diabetes mellitus (HCC)  -= sliding scale coverage -will start on Lantus 20 units daily. -Appreciate diabetes coordinator recommendation-- metformin and glipizide at discharge  OSA (obstructive sleep apnea) -defer to primary care physician  Hypertension -resume losartan  AbNormal ultrasound of the liver -Liver ultrasound showed 3.9 cm echogenic mass. -recommendation for outpatient MRI--defer to PCP    DVT prophylaxis: lovenox Code Status: full code  Family Communication: none  Disposition Plan: Back to previous home environment likely tomorrow  per surgery rec Consults called: Surgery, dr. Claudine Mouton    TOTAL TIME TAKING CARE OF THIS PATIENT: *25 minutes.  >50% time spent on counselling and coordination of care  Note: This dictation was prepared with Dragon dictation along with smaller phrase technology. Any transcriptional errors that result from this process are unintentional.  Enedina Finner M.D    [facility]   CC: Primary care physician; Patient, No Pcp PerPatient ID: Gabriel Waters, male   DOB: 05-02-1975, 44 y.o.   MRN: [id number]

## 2019-03-16 NOTE — Plan of Care (Signed)
No acute events. Patient has remained alert and oriented x4. His vitals have been stable. He is maintaining his oxygenation on room air. He denied pain when asked. Compliant with medication therapies. IV antibiotic infused without incident. Receiving continuous fluids as ordered. Blood glucose monitored every 4 hours as ordered. He has been hyperglycemic throughout the night. Correctional insulin administered per sliding scale. Abdominal incision sites without s/s of infection. JP drain intact and with moderate amount of drainage. Foley cathter was intact and patent. Catheter care provided. Foley removed per order. Patient has until 8a to urinate. He has ambulated to the bedside commode without assistance. He had a medium sized bowel movement. Falls and safety precautions in place. He remains free of falls and injuries. Appropriately calling for assistance as needed. Awaiting possible discharge to home. Plan of care continued.    Problem: Education: Goal: Knowledge of General Education information will improve Description: Including pain rating scale, medication(s)/side effects and non-pharmacologic comfort measures Outcome: Progressing   Problem: Health Behavior/Discharge Planning: Goal: Ability to manage health-related needs will improve Outcome: Progressing   Problem: Clinical Measurements: Goal: Ability to maintain clinical measurements within normal limits will improve Outcome: Progressing Goal: Will remain free from infection Outcome: Progressing Goal: Diagnostic test results will improve Outcome: Progressing Goal: Respiratory complications will improve Outcome: Progressing Goal: Cardiovascular complication will be avoided Outcome: Progressing   Problem: Activity: Goal: Risk for activity intolerance will decrease Outcome: Progressing   Problem: Nutrition: Goal: Adequate nutrition will be maintained Outcome: Progressing   Problem: Coping: Goal: Level of anxiety will  decrease Outcome: Progressing   Problem: Elimination: Goal: Will not experience complications related to bowel motility Outcome: Progressing Goal: Will not experience complications related to urinary retention Outcome: Progressing   Problem: Pain Managment: Goal: General experience of comfort will improve Outcome: Progressing   Problem: Safety: Goal: Ability to remain free from injury will improve Outcome: Progressing   Problem: Skin Integrity: Goal: Risk for impaired skin integrity will decrease Outcome: Progressing

## 2019-03-17 LAB — BASIC METABOLIC PANEL
Anion gap: 8 (ref 5–15)
BUN: 15 mg/dL (ref 6–20)
CO2: 25 mmol/L (ref 22–32)
Calcium: 8.3 mg/dL — ABNORMAL LOW (ref 8.9–10.3)
Chloride: 108 mmol/L (ref 98–111)
Creatinine, Ser: 1 mg/dL (ref 0.61–1.24)
GFR calc Af Amer: >60 mL/min (ref >60)
GFR calc non Af Amer: >60 mL/min (ref >60)
Glucose, Bld: 190 mg/dL — ABNORMAL HIGH (ref 70–99)
Potassium: 3.5 mmol/L (ref 3.5–5.1)
Sodium: 141 mmol/L (ref 135–145)

## 2019-03-17 LAB — GLUCOSE, CAPILLARY
Glucose-Capillary: 175 mg/dL — ABNORMAL HIGH (ref 70–99)
Glucose-Capillary: 177 mg/dL — ABNORMAL HIGH (ref 70–99)
Glucose-Capillary: 197 mg/dL — ABNORMAL HIGH (ref 70–99)
Glucose-Capillary: 211 mg/dL — ABNORMAL HIGH (ref 70–99)

## 2019-03-17 LAB — SURGICAL PATHOLOGY

## 2019-03-17 MED ORDER — OXYCODONE-ACETAMINOPHEN 5-325 MG PO TABS: 1.0000 | ORAL_TABLET | Freq: Four times a day (QID) | ORAL | 0 refills | Status: DC | PRN

## 2019-03-17 MED ORDER — ALUM & MAG HYDROXIDE-SIMETH 200-200-20 MG/5ML PO SUSP
30.0000 mL | Freq: Once | ORAL | Status: AC
  Administered 2019-03-17: 30 mL via ORAL
  Filled 2019-03-17: qty 30

## 2019-03-17 MED ORDER — AMOXICILLIN-POT CLAVULANATE 875-125 MG PO TABS: 1.0000 | ORAL_TABLET | Freq: Two times a day (BID) | ORAL | 0 refills | Status: AC

## 2019-03-17 MED ORDER — AMOXICILLIN-POT CLAVULANATE 875-125 MG PO TABS
1.0000 | ORAL_TABLET | Freq: Two times a day (BID) | ORAL | Status: DC
  Administered 2019-03-17: 13:00:00 1 via ORAL
  Filled 2019-03-17: qty 1

## 2019-03-17 MED ORDER — POTASSIUM CHLORIDE CRYS ER 20 MEQ PO TBCR
20.0000 meq | EXTENDED_RELEASE_TABLET | Freq: Once | ORAL | Status: AC
  Administered 2019-03-17: 10:00:00 20 meq via ORAL
  Filled 2019-03-17: qty 1

## 2019-03-17 NOTE — Progress Notes (Signed)
   03/17/19 1400  Clinical Encounter Type  Visited With Patient  Visit Type Initial  Referral From Chaplain  Consult/Referral To Chaplain  While doing rounds, Chaplain visited with patient. Patient was laying side ways on the bed. Patient said he feels better. Patient said they are thinking about letting me go home today. Chaplain offered pastoral presence, empathy, and prayer.

## 2019-03-17 NOTE — Discharge Instructions (Signed)
In addition to included general post-operative instructions for laparoscopic cholecystectomy,  Diet: Resume home diet.   Activity: No heavy lifting >20 pounds (children, pets, laundry, garbage) for at least 4 weeks, but light activity and walking are encouraged. Do not drive or drink alcohol if taking narcotic pain medications or having pain that might distract from driving.  Wound care: 2 days after surgery (02/25), you may shower/get incision wet with soapy water and pat dry (do not rub incisions), but no baths or submerging incision underwater until follow-up.   Medications: Resume all home medications. For mild to moderate pain: acetaminophen (Tylenol) or ibuprofen/naproxen (if no kidney disease). Combining Tylenol with alcohol can substantially increase your risk of causing liver disease. Narcotic pain medications, if prescribed, can be used for severe pain, though may cause nausea, constipation, and drowsiness. Do not combine Tylenol and Percocet (or similar) within a 6 hour period as Percocet (and similar) contain(s) Tylenol. If you do not need the narcotic pain medication, you do not need to fill the prescription.  Call office [phone] / [phone]) at any time if any questions, worsening pain, fevers/chills, bleeding, drainage from incision site, or other concerns.   Patient will follow-up with his family nurse practitioner Gabriel Waters at [facility] in 1-2 weeks

## 2019-03-17 NOTE — Evaluation (Signed)
Physical Therapy Evaluation Patient Details Name: Gabriel Waters MRN: [id number] DOB: 26-Jul-1975 Today's Date: 03/17/2019   History of Present Illness  presented to ER secondary to vomiting, diarrhea and abdominal pain; admitted for management of acute cholecystitis due to biliary calculus, s/p robot-assisted laparascopic cholecystectomy  Clinical Impression  Upon evaluation, patient alert and oriented; follows commands and agreeable to session.  Rates abdominal pain 4/10 at rest, 5/10 with mobility.  Bilat UE/LE strength and ROM grossly symmetrical and WFL; no focal weakness appreciated.  Able to complete bed mobility with close sup; sit/stand, basic transfers and gait (220') without assist device, sup/mod indep.  Demonstrates reciprocal stepping pattern with good step height/length; slow and guarded gait performance (10' walk time, 10-11 seconds), minimal trunk rotation and arm swing; no overt buckling or LOB.  Two brief standing rest periods for rest and pain mangaement. Would benefit from skilled PT to address above deficits and promote optimal return to PLOF; will maintain on caseload to ensure progressive mobilization throughout remaining hospitalization.  Anticipate no formal PT needs upon discharge.      Follow Up Recommendations No PT follow up    Equipment Recommendations       Recommendations for Other Services       Precautions / Restrictions Precautions Precaution Comments: JP drain Restrictions Weight Bearing Restrictions: No      Mobility  Bed Mobility Overal bed mobility: Needs Assistance Bed Mobility: Supine to Sit;Sit to Supine     Supine to sit: Supervision Sit to supine: Supervision   General bed mobility comments: min cuing for log-rolling technique to protect abdomen  Transfers Overall transfer level: Modified independent Equipment used: None             General transfer comment: broad BOS, mild bracing due to abdominal discomfort; steady with  good LE strenght/power  Ambulation/Gait Ambulation/Gait assistance: Supervision;Modified independent (Device/Increase time) Gait Distance (Feet): 220 Feet Assistive device: None   Gait velocity: 10' walk time, 10-11 seconds   General Gait Details: reciprocal stepping pattern with good step height/length; slow and guarded gait performance, minimal trunk rotation and arm swing; no overt buckling or LOB.  Two brief standing rest periods for rest and pain mangaement.  Stairs            Wheelchair Mobility    Modified Rankin (Stroke Patients Only)       Balance Overall balance assessment: Modified Independent                                           Pertinent Vitals/Pain Pain Assessment: 0-10 Pain Score: 4  Pain Location: abdomen Pain Descriptors / Indicators: Aching Pain Intervention(s): Limited activity within patient's tolerance;Monitored during session;Repositioned;RN gave pain meds during session    Home Living Family/patient expects to be discharged to:: Private residence Living Arrangements: Spouse/significant other Available Help at Discharge: Family Type of Home: House Home Access: Stairs to enter Entrance Stairs-Rails: Doctor, general practice of Steps: 4 Home Layout: One level Home Equipment: None      Prior Function Level of Independence: Independent         Comments: Indep with ADLs, household and community mobilization without assist device; 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     Hand Dominance        Extremity/Trunk Assessment   Upper Extremity Assessment Upper Extremity Assessment: Overall WFL for tasks assessed  Lower Extremity Assessment Lower Extremity Assessment: Overall WFL for tasks assessed       Communication   Communication: No difficulties  Cognition Arousal/Alertness: Awake/alert Behavior During Therapy: WFL for tasks assessed/performed Overall Cognitive  Status: Within Functional Limits for tasks assessed                                        General Comments      Exercises Other Exercises Other Exercises: Educated in log rolling, positioning to for pain management; educated in activity pacing.  Patient voiced understanding.   Assessment/Plan    PT Assessment Patient needs continued PT services  PT Problem List Decreased activity tolerance;Decreased balance;Decreased mobility;Pain;Decreased skin integrity       PT Treatment Interventions DME instruction;Therapeutic activities;Gait training;Functional mobility training;Therapeutic exercise;Balance training;Patient/family education    PT Goals (Current goals can be found in the Care Plan section)  Acute Rehab PT Goals Patient Stated Goal: for pain to improve PT Goal Formulation: With patient Time For Goal Achievement: 03/31/19 Potential to Achieve Goals: Good    Frequency Min 2X/week   Barriers to discharge        Co-evaluation               AM-PAC PT "6 Clicks" Mobility  Outcome Measure Help needed turning from your back to your side while in a flat bed without using bedrails?: None Help needed moving from lying on your back to sitting on the side of a flat bed without using bedrails?: None Help needed moving to and from a bed to a chair (including a wheelchair)?: None Help needed standing up from a chair using your arms (e.g., wheelchair or bedside chair)?: None Help needed to walk in hospital room?: None Help needed climbing 3-5 steps with a railing? : A Little 6 Click Score: 23    End of Session   Activity Tolerance: Patient tolerated treatment well Patient left: in bed;with call bell/phone within reach Nurse Communication: Mobility status PT Visit Diagnosis: Muscle weakness (generalized) (M62.81);Difficulty in walking, not elsewhere classified (R26.2);Pain    Time: 2376-2831 PT Time Calculation (min) (ACUTE ONLY): 29 min   Charges:    PT Evaluation $PT Eval Moderate Complexity: 1 Mod PT Treatments $Therapeutic Activity: 8-22 mins        Joziyah Roblero H. Owens Shark, PT, DPT, NCS 03/17/19, 1:52 PM [phone]

## 2019-03-17 NOTE — Progress Notes (Signed)
[facility] SURGICAL PROGRESS NOTE  Hospital Day(s): 3.   Post op day(s): 2 Days Post-Op.   Interval History:  Patient seen and examined no acute events or new complaints overnight.  Patient reports he is feeling better this morning. Still with incisional soreness No fever, chills, nausea or emesis Hyperglycemia more reasonably controlled this morning Resolved hypokalemia Surgical drain with 160 ccs out since placement On regular carb modified diet and tolerating without issue   Vital signs in last 24 hours: [min-max] current  Temp:  [98.1 F (36.7 C)-99.3 F (37.4 C)] 99.3 F (37.4 C) (02/25 0551) Pulse Rate:  [94-102] 94 (02/25 0551) Resp:  [16-18] 16 (02/25 0551) BP: (130-155)/(80-94) 137/90 (02/25 0551) SpO2:  [92 %-98 %] 92 % (02/25 0551)     Height: 6\' 3"  (190.5 cm) Weight: 125.4 kg BMI (Calculated): 34.56   Intake/Output last 2 shifts:  02/24 0701 - 02/25 0700 In: -  Out: 2210 [Urine:2050; Drains:160]   Physical Exam:  Constitutional: alert, cooperative and no distress  Respiratory: breathing non-labored at rest  Cardiovascular: regular rate and sinus rhythm  Gastrointestinal: Obese, soft, incisional soreness, and non-distended. No rebound/guarding. JP in the right mid-abdomen with serous output Integumentary: Laparoscopic incisions are CDI with dermabond, no erythema or drainage.   Labs:  CBC Latest Ref Rng & Units 03/16/2019 03/15/2019 03/14/2019  WBC 4.0 - 10.5 K/uL 7.3 12.3(H) 16.0(H)  Hemoglobin 13.0 - 17.0 g/dL 10.4(L) 12.2(L) 13.6  Hematocrit 39.0 - 52.0 % 31.8(L) 37.1(L) 41.2  Platelets 150 - 400 K/uL 248 271 301   CMP Latest Ref Rng & Units 03/17/2019 03/16/2019 03/15/2019  Glucose 70 - 99 mg/dL 03/17/2019) 062(I) 948(N)  BUN 6 - 20 mg/dL 15 20 462(V)  Creatinine 0.61 - 1.24 mg/dL 03(J 0.09 3.81)  Sodium 135 - 145 mmol/L 141 139 136  Potassium 3.5 - 5.1 mmol/L 3.5 3.2(L) 3.3(L)  Chloride 98 - 111 mmol/L 108 109 100  CO2 22 - 32 mmol/L 25 24  25   Calcium 8.9 - 10.3 mg/dL 8.3(L) 7.9(L) 8.7(L)  Total Protein 6.5 - 8.1 g/dL - 5.9(L) 7.1  Total Bilirubin 0.3 - 1.2 mg/dL - 8.29(H) 2.7(H)  Alkaline Phos 38 - 126 U/L - 90 92  AST 15 - 41 U/L - 39 19  ALT 0 - 44 U/L - 30 20     Imaging studies: No new pertinent imaging studies   Assessment/Plan: 44 y.o. male with resolution in leukocytosis and still with mild but improved hyperbilirubinemia 2 Days Post-Op s/p robotic assisted laparoscopic cholecystectomy for gangrenous cholecystitis, complicated by pertinent comorbidities including unctrolled T2DM.   3.7(J for regular (carb modified) diet  - Discotninue IVF resuscitation  - Continue IV ABx (Zosyn): transition to PO for home   - pain control prn; antiemetics prn  - monitor abdominal examination; on-going bowel function   - Continue drain; monitor output   - Mobilize   - Encouraged IS use   - Further management per primary service    - Discharge Planning: Okay for discharge from surgical standpoint, Augmentin x7 days at discharge, pain control, drain teaching provided in chart, follow up with general surgery early next week  All of the above findings and recommendations were discussed with the patient, and the medical team, and all of patient's questions were answered to his expressed satisfaction.  -- 58, PA-C [facility] 03/17/2019, 7:23 AM [phone] M-F: 7am - 4pm

## 2019-03-17 NOTE — Consult Note (Signed)
PHARMACY CONSULT NOTE - FOLLOW UP  Pharmacy Consult for Electrolyte Monitoring and Replacement   Recent Labs: Potassium (mmol/L)  Date Value  03/17/2019 3.5   Magnesium (mg/dL)  Date Value  29/19/1660 2.0   Calcium (mg/dL)  Date Value  60/04/5995 8.3 (L)   Albumin (g/dL)  Date Value  74/14/2395 2.5 (L)   Sodium (mmol/L)  Date Value  03/17/2019 141     Assessment: Pharmacy has been consulted electrolytes management. Patient has a PMH significant for HTN, diabetes who was admitted for acute cholecystitis. Patient is POD #1. Currently, patient is on continuous NS @ 100 cc/hr.  K 3.2> 3.5  Scr 1.51 > 1.21> 1.00  Mg pending   Goal of Therapy:  Electrolytes WNL  Plan:  Will order KCL PO 20 mEq x1 dose and will order BMP with AM labs.    Katha Cabal ,PharmD Clinical Pharmacist 03/17/2019 7:19 AM

## 2019-03-17 NOTE — Discharge Summary (Signed)
[facility] [place] [facility]   PATIENT NAME: Gabriel Waters    MR#:  [id number]  DATE OF BIRTH:  [birth year]  DATE OF ADMISSION:  03/14/2019 ADMITTING PHYSICIAN: Andris Baumann, MD  DATE OF DISCHARGE: 03/17/2019  PRIMARY CARE PHYSICIAN: Patient, No Pcp Per    ADMISSION DIAGNOSIS:  Gallstones [K80.20] RUQ pain [R10.11] Acute cholecystitis due to biliary calculus [K80.00]  DISCHARGE DIAGNOSIS:  Gangrenous cholecystitis status post laparoscopic cholecystectomy  SECONDARY DIAGNOSIS:   Past Medical History:  Diagnosis Date  . DIABETES MELLITUS, TYPE II, UNCONTROLLED 01/02/2010  . ELEVATED BLOOD PRESSURE 12/31/2009  . Hypertension   . Pancreatitis 09/13/2013  . Sleep apnea     HOSPITAL COURSE:  Gabriel Waters a 43 y.o.malewith medical history significant fortype 2 diabetes, hypertension and obstructive sleep apnea who presents to the emergency room with a 2-day history of vomiting diarrhea and abdominal pain. He said he also had a temperature of 100 at home. Denied cough or shortness of breath  Acute gangrenous cholecystitis with biliary calculus (gall bladder neck) --IV Zosyn 3.375 mg every 8 hourly--- change to oral Augmentin -s/p POD# 2  cholecystectomy -patient received IV hydration, IV antiemetics, IV and po pain meds prn -tolerating carb controlled diet -pt will go home with JP drain per Gabriel Malkin, PA  AKI (acute kidney injury) (HCC) appears prerenal azotemia/dehydration due to vomiting-  -baseline creatinine .8 -came in with creatinine of 1.62-- IV fluids--1.5--1.0 -resolved  Hyperglycemia due to type 2 diabetes mellitus (HCC)  - sliding scale coverage -was on Lantus 20 units daily in house -resume home meds at discharge metfromin and alogliptin  OSA (obstructive sleep apnea) -defer to primary care physician  Hypertension -resume losartan  AbNormal ultrasound of the liver -Liver ultrasound showed 3.9 cmechogenic  mass. -recommendation for outpatient MRI--defer to PCP Gabriel Waters ,FNP -pt is informed of the USG results and knows he has to f/u with FNP regarding it.  PT to see pt. D/c to home thereafter  CONSULTS OBTAINED:  Treatment Team:  Campbell Lerner, MD  DRUG ALLERGIES:   Allergies  Allergen Reactions  . Bydureon [Exenatide] Other (See Comments)    pancreatitis  . Invokana [Canagliflozin] Other (See Comments)    Yeast balanitis  . Lipitor [Atorvastatin] Other (See Comments)    Arthralgias.  . Lisinopril Other (See Comments)    pancreatitis    DISCHARGE MEDICATIONS:   Allergies as of 03/17/2019      Reactions   Bydureon [exenatide] Other (See Comments)   pancreatitis   Invokana [canagliflozin] Other (See Comments)   Yeast balanitis   Lipitor [atorvastatin] Other (See Comments)   Arthralgias.   Lisinopril Other (See Comments)   pancreatitis      Medication List    TAKE these medications   acetaminophen 500 MG tablet Commonly known as: TYLENOL Take 500-1,000 mg by mouth every 6 (six) hours as needed for mild pain or fever.   Alogliptin Benzoate 12.5 MG Tabs Take 12.5 mg by mouth daily.   amoxicillin-clavulanate 875-125 MG tablet Commonly known as: AUGMENTIN Take 1 tablet by mouth every 12 (twelve) hours for 5 days.   losartan 50 MG tablet Commonly known as: COZAAR Take 1 tablet (50 mg total) by mouth daily.   metFORMIN 1000 MG tablet Commonly known as: GLUCOPHAGE Take 1,000 mg by mouth 2 (two) times daily.   oxyCODONE-acetaminophen 5-325 MG tablet Commonly known as: PERCOCET/ROXICET Take 1 tablet by mouth every 6 (six) hours as needed for moderate pain or severe pain.  rosuvastatin 20 MG tablet Commonly known as: CRESTOR Take 1 tablet (20 mg total) by mouth daily.       If you experience worsening of your admission symptoms, develop shortness of breath, life threatening emergency, suicidal or homicidal thoughts you must seek medical attention  immediately by calling 911 or calling your MD immediately  if symptoms less severe.  You Must read complete instructions/literature along with all the possible adverse reactions/side effects for all the Medicines you take and that have been prescribed to you. Take any new Medicines after you have completely understood and accept all the possible adverse reactions/side effects.   Please note  You were cared for by a hospitalist during your hospital stay. If you have any questions about your discharge medications or the care you received while you were in the hospital after you are discharged, you can call the unit and asked to speak with the hospitalist on call if the hospitalist that took care of you is not available. Once you are discharged, your primary care physician will handle any further medical issues. Please note that NO REFILLS for any discharge medications will be authorized once you are discharged, as it is imperative that you return to your primary care physician (or establish a relationship with a primary care physician if you do not have one) for your aftercare needs so that they can reassess your need for medications and monitor your lab values. Today   SUBJECTIVE   Mild abd pain, tolerating regular carb diet  VITAL SIGNS:  Blood pressure (!) 146/85, pulse 93, temperature 98.2 F (36.8 C), temperature source Oral, resp. rate 18, height 6\' 3"  (1.905 m), weight 125.4 kg, SpO2 96 %.  I/O:    Intake/Output Summary (Last 24 hours) at 03/17/2019 1148 Last data filed at 03/17/2019 0940 Gross per 24 hour  Intake --  Output 1635 ml  Net -1635 ml    PHYSICAL EXAMINATION:  GENERAL:  44 y.o.-year-old patient lying in the bed with no acute distress. Obese EYES: Pupils equal, round, reactive to light and accommodation. No scleral icterus.  HEENT: Head atraumatic, normocephalic. Oropharynx and nasopharynx clear.  NECK:  Supple, no jugular venous distention. No thyroid enlargement, no  tenderness.  LUNGS: Normal breath sounds bilaterally, no wheezing, rales,rhonchi or crepitation. No use of accessory muscles of respiration.  CARDIOVASCULAR: S1, S2 normal. No murmurs, rubs, or gallops.  ABDOMEN: Soft, non-tender, non-distended. Bowel sounds present. No organomegaly or mass. JP drain + EXTREMITIES: No pedal edema, cyanosis, or clubbing.  NEUROLOGIC: Cranial nerves II through XII are intact. Muscle strength 5/5 in all extremities. Sensation intact. Gait not checked.  PSYCHIATRIC: The patient is alert and oriented x 3.  SKIN: No obvious rash, lesion, or ulcer.   DATA REVIEW:   CBC  Recent Labs  Lab 03/16/19 0342  WBC 7.3  HGB 10.4*  HCT 31.8*  PLT 248    Chemistries  Recent Labs  Lab 03/16/19 0342 03/16/19 0342 03/17/19 0629  NA 139   < > 141  K 3.2*   < > 3.5  CL 109   < > 108  CO2 24   < > 25  GLUCOSE 228*   < > 190*  BUN 20   < > 15  CREATININE 1.21   < > 1.00  CALCIUM 7.9*   < > 8.3*  MG 2.0  --   --   AST 39  --   --   ALT 30  --   --  ALKPHOS 90  --   --   BILITOT 1.4*  --   --    < > = values in this interval not displayed.    Microbiology Results   Recent Results (from the past 240 hour(s))  Respiratory Panel by RT PCR (Flu A&B, Covid) - Nasopharyngeal Swab     Status: None   Collection Time: 03/14/19  5:15 PM   Specimen: Nasopharyngeal Swab  Result Value Ref Range Status   SARS Coronavirus 2 by RT PCR NEGATIVE NEGATIVE Final    Comment: (NOTE) SARS-CoV-2 target nucleic acids are NOT DETECTED. The SARS-CoV-2 RNA is generally detectable in upper respiratoy specimens during the acute phase of infection. The lowest concentration of SARS-CoV-2 viral copies this assay can detect is 131 copies/mL. A negative result does not preclude SARS-Cov-2 infection and should not be used as the sole basis for treatment or other patient management decisions. A negative result may occur with  improper specimen collection/handling, submission of specimen  other than nasopharyngeal swab, presence of viral mutation(s) within the areas targeted by this assay, and inadequate number of viral copies (<131 copies/mL). A negative result must be combined with clinical observations, patient history, and epidemiological information. The expected result is Negative. Fact Sheet for Patients:  PinkCheek.be Fact Sheet for Healthcare Providers:  GravelBags.it This test is not yet ap proved or cleared by the [place] FDA and  has been authorized for detection and/or diagnosis of SARS-CoV-2 by FDA under an Emergency Use Authorization (EUA). This EUA will remain  in effect (meaning this test can be used) for the duration of the COVID-19 declaration under Section 564(b)(1) of the Act, 21 U.S.C. section 360bbb-3(b)(1), unless the authorization is terminated or revoked sooner.    Influenza A by PCR NEGATIVE NEGATIVE Final   Influenza B by PCR NEGATIVE NEGATIVE Final    Comment: (NOTE) The Xpert Xpress SARS-CoV-2/FLU/RSV assay is intended as an aid in  the diagnosis of influenza from Nasopharyngeal swab specimens and  should not be used as a sole basis for treatment. Nasal washings and  aspirates are unacceptable for Xpert Xpress SARS-CoV-2/FLU/RSV  testing. Fact Sheet for Patients: PinkCheek.be Fact Sheet for Healthcare Providers: GravelBags.it This test is not yet approved or cleared by the [place] FDA and  has been authorized for detection and/or diagnosis of SARS-CoV-2 by  FDA under an Emergency Use Authorization (EUA). This EUA will remain  in effect (meaning this test can be used) for the duration of the  Covid-19 declaration under Section 564(b)(1) of the Act, 21  U.S.C. section 360bbb-3(b)(1), unless the authorization is  terminated or revoked. Performed at [facility], [address], [place], [place]  [address]     RADIOLOGY:  No results found.   CODE STATUS:     Code Status Orders  (From admission, onward)         Start     Ordered   03/14/19 2134  Full code  Continuous     03/14/19 2140        Code Status History    Date Active Date Inactive Code Status Order ID Comments User Context   09/13/2013 1410 09/15/2013 1912 Full Code [id number]  Modena Jansky, MD ED   Advance Care Planning Activity       TOTAL TIME TAKING CARE OF THIS PATIENT: *40* minutes.    Fritzi Mandes M.D  [facility]    CC: Primary care physician; Patient, No Pcp Per

## 2019-03-17 NOTE — Plan of Care (Signed)
Discharged education and information has been provided. Teach back method utilized. Drain teaching education has been completed. IVs removed.   Problem: Education: Goal: Knowledge of General Education information will improve Description: Including pain rating scale, medication(s)/side effects and non-pharmacologic comfort measures Outcome: Completed/Met   Problem: Health Behavior/Discharge Planning: Goal: Ability to manage health-related needs will improve Outcome: Completed/Met   Problem: Clinical Measurements: Goal: Ability to maintain clinical measurements within normal limits will improve Outcome: Completed/Met Goal: Will remain free from infection Outcome: Completed/Met Goal: Diagnostic test results will improve Outcome: Completed/Met Goal: Respiratory complications will improve Outcome: Completed/Met Goal: Cardiovascular complication will be avoided Outcome: Completed/Met   Problem: Activity: Goal: Risk for activity intolerance will decrease Outcome: Completed/Met   Problem: Nutrition: Goal: Adequate nutrition will be maintained Outcome: Completed/Met   Problem: Coping: Goal: Level of anxiety will decrease Outcome: Completed/Met   Problem: Elimination: Goal: Will not experience complications related to bowel motility Outcome: Completed/Met Goal: Will not experience complications related to urinary retention Outcome: Completed/Met   Problem: Pain Managment: Goal: General experience of comfort will improve Outcome: Completed/Met   Problem: Safety: Goal: Ability to remain free from injury will improve Outcome: Completed/Met   Problem: Skin Integrity: Goal: Risk for impaired skin integrity will decrease Outcome: Completed/Met   Problem: Inadequate Intake (NI-2.1) Goal: Food and/or nutrient delivery Description: Individualized approach for food/nutrient provision. Outcome: Completed/Met   Problem: Acute Rehab PT Goals(only PT should resolve) Goal: Pt Will  Go Supine/Side To Sit Outcome: Completed/Met Goal: Pt Will Transfer Bed To Chair/Chair To Bed Outcome: Completed/Met Goal: Pt Will Ambulate Outcome: Completed/Met Goal: Pt Will Go Up/Down Stairs Outcome: Completed/Met

## 2019-03-22 ENCOUNTER — Other Ambulatory Visit: Payer: Self-pay

## 2019-03-22 ENCOUNTER — Encounter: Payer: Self-pay | Admitting: Surgery

## 2019-03-22 ENCOUNTER — Ambulatory Visit (INDEPENDENT_AMBULATORY_CARE_PROVIDER_SITE_OTHER): Payer: Self-pay | Admitting: Surgery

## 2019-03-22 VITALS — BP 113/75 | HR 92 | Temp 97.5°F | Resp 14 | Ht 74.0 in | Wt 281.0 lb

## 2019-03-22 DIAGNOSIS — Z9049 Acquired absence of other specified parts of digestive tract: Secondary | ICD-10-CM

## 2019-03-22 NOTE — Patient Instructions (Addendum)
We have removed your drain today. You may keep a bandage over it until it fully closes. It usually takes 1-2 days. Follow up here in 3 weeks. We will discuss your return to work at that visit.                                     GENERAL POST-OPERATIVE PATIENT INSTRUCTIONS   WOUND CARE INSTRUCTIONS:  Keep a dry clean dressing on the wound if there is drainage. The initial bandage may be removed after 24 hours.  Once the wound has quit draining you may leave it open to air.  If clothing rubs against the wound or causes irritation and the wound is not draining you may cover it with a dry dressing during the daytime.  Try to keep the wound dry and avoid ointments on the wound unless directed to do so.  If the wound becomes bright red and painful or starts to drain infected material that is not clear, please contact your physician immediately.  If the wound is mildly pink and has a thick firm ridge underneath it, this is normal, and is referred to as a healing ridge.  This will resolve over the next 4-6 weeks.  BATHING: You may shower if you have been informed of this by your surgeon. However, Please do not submerge in a tub, hot tub, or pool until incisions are completely sealed or have been told by your surgeon that you may do so.  DIET:  You may eat any foods that you can tolerate.  It is a good idea to eat a high fiber diet and take in plenty of fluids to prevent constipation.  If you do become constipated you may want to take a mild laxative or take ducolax tablets on a daily basis until your bowel habits are regular.  Constipation can be very uncomfortable, along with straining, after recent surgery.  ACTIVITY:  You are encouraged to walk and engage in light activity for the next two weeks.  You should not lift more than 20 pounds for 6 weeks after surgery as it could put you at increased risk for complications.  Twenty pounds is roughly equivalent to a plastic bag of groceries. At that time- Listen  to your body when lifting, if you have pain when lifting, stop and then try again in a few days. Soreness after doing exercises or activities of daily living is normal as you get back in to your normal routine.  MEDICATIONS:  Try to take narcotic medications and anti-inflammatory medications, such as tylenol, ibuprofen, naprosyn, etc., with food.  This will minimize stomach upset from the medication.  Should you develop nausea and vomiting from the pain medication, or develop a rash, please discontinue the medication and contact your physician.  You should not drive, make important decisions, or operate machinery when taking narcotic pain medication.  SUNBLOCK Use sun block to incision area over the next year if this area will be exposed to sun. This helps decrease scarring and will allow you avoid a permanent darkened area over your incision.  QUESTIONS:  Please feel free to call our office if you have any questions, and we will be glad to assist you.

## 2019-03-22 NOTE — Progress Notes (Signed)
[facility] POST-OP OFFICE VISIT  03/22/2019  HPI: Gabriel Waters is a 44 y.o. male 7 days s/p robotic cholecystectomy for gangrenous cholecystitis.  Vital signs: BP 113/75   Pulse 92   Temp (!) 97.5 F (36.4 C)   Resp 14   Ht 6\' 2"  (1.88 m)   Wt 281 lb (127.5 kg)   SpO2 100%   BMI 36.08 kg/m    Physical Exam: Constitutional: Appears well, nontoxic.  Sclera are white. Abdomen: Right upper quadrant Jackson-Pratt drain with serous drainage present in tubing and bulb.  The drain was removed.  Dressing applied.  Remaining incisions are clean, dry and intact.  Assessment/Plan: This is a 44 y.o. male 7 days s/p robotic cholecystectomy.  Patient Active Problem List   Diagnosis Date Noted  . Acute cholecystitis due to biliary calculus 03/14/2019  . AKI (acute kidney injury) (HCC) 03/14/2019  . Preoperative clearance 03/14/2019  . Abnormal ultrasound of liver 03/14/2019  . Pancreatitis, acute 09/13/2013  . Acute pancreatitis 09/13/2013  . Hypertension   . Low testosterone 06/08/2013  . OSA (obstructive sleep apnea) 04/05/2013  . Erectile dysfunction 01/04/2013  . Severe obesity (BMI >= 40) (HCC) 12/05/2012  . Hyperlipidemia 04/01/2010  . Hyperglycemia due to type 2 diabetes mellitus (HCC) 01/02/2010  . ABSCESS, NECK 12/31/2009  . 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 POLYDIPSIA 12/31/2009  . ELEVATED BLOOD PRESSURE 12/31/2009    -Remove the drain today, anticipate releasing him back to work in about 3 weeks after follow-up visit.  We encouraged him to ambulate progressively anticipating this will strengthen him gradually and restore his appetite.   14/12/2009 M.D., FACS 03/22/2019, 11:24 AM

## 2019-04-06 ENCOUNTER — Telehealth: Payer: Self-pay | Admitting: *Deleted

## 2019-04-06 NOTE — Telephone Encounter (Signed)
Left message for patient to call the office back, I need a fax number to fax his FMLA

## 2019-04-06 NOTE — Telephone Encounter (Signed)
Faxed FMLA to [phone]

## 2019-04-14 ENCOUNTER — Ambulatory Visit (INDEPENDENT_AMBULATORY_CARE_PROVIDER_SITE_OTHER): Payer: Self-pay | Admitting: Surgery

## 2019-04-14 ENCOUNTER — Encounter: Payer: Self-pay | Admitting: Surgery

## 2019-04-14 VITALS — BP 136/93 | HR 103 | Temp 97.2°F | Resp 14 | Ht 74.0 in | Wt 283.0 lb

## 2019-04-14 DIAGNOSIS — Z9049 Acquired absence of other specified parts of digestive tract: Secondary | ICD-10-CM

## 2019-04-14 NOTE — Progress Notes (Signed)
[facility] POST-OP OFFICE VISIT  04/14/2019  HPI: Gabriel Waters is a 44 y.o. male 30 days s/p robotic cholecystectomy for gangrenous cholecystitis. Reports minimal residual incisional tenderness.  Primarily a discomfort of his right flank when he lays on his left lateral decubitus position.  He denies fevers and chills.  He reports his appetite is returning.  Has no drainage at his incision sites.  And has resolution of his postoperative diarrhea.  Reports he had lab work including LFTs at [facility].  Those results are not available at present.  Vital signs: BP (!) 136/93   Pulse (!) 103   Temp (!) 97.2 F (36.2 C)   Resp 14   Ht 6\' 2"  (1.88 m)   Wt 283 lb (128.4 kg)   SpO2 100%   BMI 36.34 kg/m    Physical Exam: Constitutional: Appears well, sclera are anicteric Abdomen: Soft with evidence of weight loss, nontender. Skin: All incisions well-healed.  Including drain site.  Assessment/Plan: This is a 44 y.o. male 30 days s/p robotic cholecystectomy for gangrenous cholecystitis.  Patient Active Problem List   Diagnosis Date Noted  . Status post laparoscopic cholecystectomy 03/22/2019  . AKI (acute kidney injury) (HCC) 03/14/2019  . Preoperative clearance 03/14/2019  . Abnormal ultrasound of liver 03/14/2019  . Hypertension   . Low testosterone 06/08/2013  . OSA (obstructive sleep apnea) 04/05/2013  . Erectile dysfunction 01/04/2013  . Severe obesity (BMI >= 40) (HCC) 12/05/2012  . Hyperlipidemia 04/01/2010  . Hyperglycemia due to type 2 diabetes mellitus (HCC) 01/02/2010  . ABSCESS, NECK 12/31/2009  . 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 POLYDIPSIA 12/31/2009  . ELEVATED BLOOD PRESSURE 12/31/2009    -We discussed his return to work plans, I do not anticipate a need for additional follow-up.  Hope to see his recent lab work for confirmation.   14/12/2009 M.D., FACS 04/14/2019, 1:46 PM

## 2019-04-14 NOTE — Patient Instructions (Addendum)
Follow-up with our office as needed.  Please call and ask to speak with a nurse if you develop questions or concerns.    GENERAL POST-OPERATIVE PATIENT INSTRUCTIONS   WOUND CARE INSTRUCTIONS: If clothing rubs against the wound or causes irritation and the wound is not draining you may cover it with a dry dressing during the daytime.  Try to keep the wound dry and avoid ointments on the wound unless directed to do so.  If the wound becomes bright red and painful or starts to drain infected material that is not clear, please contact your physician immediately.  If the wound is mildly pink and has a thick firm ridge underneath it, this is normal, and is referred to as a healing ridge.  This will resolve over the next 4-6 weeks.  BATHING: You may shower if you have been informed of this by your surgeon. However, Please do not submerge in a tub, hot tub, or pool until incisions are completely sealed or have been told by your surgeon that you may do so.  DIET:  You may eat any foods that you can tolerate.  It is a good idea to eat a high fiber diet and take in plenty of fluids to prevent constipation.  If you do become constipated you may want to take a mild laxative or take ducolax tablets on a daily basis until your bowel habits are regular.  Constipation can be very uncomfortable, along with straining, after recent surgery.  ACTIVITY:  You may want to hug a pillow when coughing and sneezing to add additional support to the surgical area, if you had abdominal or chest surgery, which will decrease pain during these times.  You are encouraged to walk and engage in light activity for the next two weeks.  You should not lift more than 20 pounds for 6 weeks after surgery as it could put you at increased risk for complications.  Twenty pounds is roughly equivalent to a plastic bag of groceries. At that time- Listen to your body when lifting, if you have pain when lifting, stop and then try again in a few  days. Soreness after doing exercises or activities of daily living is normal as you get back in to your normal routine.  MEDICATIONS:  Try to take narcotic medications and anti-inflammatory medications, such as tylenol, ibuprofen, naprosyn, etc., with food.  This will minimize stomach upset from the medication.  Should you develop nausea and vomiting from the pain medication, or develop a rash, please discontinue the medication and contact your physician.  You should not drive, make important decisions, or operate machinery when taking narcotic pain medication.  SUNBLOCK Use sun block to incision area over the next year if this area will be exposed to sun. This helps decrease scarring and will allow you avoid a permanent darkened area over your incision.  QUESTIONS:  Please feel free to call our office if you have any questions, and we will be glad to assist you.     

## 2019-04-15 ENCOUNTER — Telehealth: Payer: Self-pay | Admitting: *Deleted

## 2019-04-15 NOTE — Telephone Encounter (Signed)
Faxed Return to work at [phone]

## 2021-04-16 ENCOUNTER — Ambulatory Visit
Admission: RE | Admit: 2021-04-16 | Discharge: 2021-04-16 | Disposition: A | Discharge status: Home / Self Care | Payer: No Typology Code available for payment source | Source: Ambulatory Visit | Attending: Physician Assistant | Admitting: Physician Assistant

## 2021-04-16 ENCOUNTER — Other Ambulatory Visit: Payer: Self-pay

## 2021-04-16 ENCOUNTER — Other Ambulatory Visit: Payer: Self-pay | Admitting: Physician Assistant

## 2021-04-16 DIAGNOSIS — M79604 Pain in right leg: Secondary | ICD-10-CM | POA: Insufficient documentation

## 2021-09-28 ENCOUNTER — Other Ambulatory Visit: Payer: Self-pay

## 2021-09-28 ENCOUNTER — Encounter: Payer: Self-pay | Admitting: Internal Medicine

## 2021-09-28 ENCOUNTER — Emergency Department: Payer: No Typology Code available for payment source

## 2021-09-28 ENCOUNTER — Inpatient Hospital Stay
Admission: EM | Admit: 2021-09-28 | Discharge: 2021-09-30 | DRG: 639 | Disposition: A | Discharge status: Home / Self Care | Payer: No Typology Code available for payment source | Attending: Internal Medicine | Admitting: Internal Medicine

## 2021-09-28 DIAGNOSIS — Z79899 Other long term (current) drug therapy: Secondary | ICD-10-CM | POA: Diagnosis not present

## 2021-09-28 DIAGNOSIS — Z888 Allergy status to other drugs, medicaments and biological substances status: Secondary | ICD-10-CM | POA: Diagnosis not present

## 2021-09-28 DIAGNOSIS — E1100 Type 2 diabetes mellitus with hyperosmolarity without nonketotic hyperglycemic-hyperosmolar coma (NKHHC): Principal | ICD-10-CM | POA: Diagnosis present

## 2021-09-28 DIAGNOSIS — Z7984 Long term (current) use of oral hypoglycemic drugs: Secondary | ICD-10-CM | POA: Diagnosis not present

## 2021-09-28 DIAGNOSIS — E876 Hypokalemia: Secondary | ICD-10-CM | POA: Diagnosis present

## 2021-09-28 DIAGNOSIS — E785 Hyperlipidemia, unspecified: Secondary | ICD-10-CM | POA: Diagnosis present

## 2021-09-28 DIAGNOSIS — Z825 Family history of asthma and other chronic lower respiratory diseases: Secondary | ICD-10-CM | POA: Diagnosis not present

## 2021-09-28 DIAGNOSIS — Z791 Long term (current) use of non-steroidal anti-inflammatories (NSAID): Secondary | ICD-10-CM | POA: Diagnosis not present

## 2021-09-28 DIAGNOSIS — I10 Essential (primary) hypertension: Secondary | ICD-10-CM | POA: Diagnosis present

## 2021-09-28 DIAGNOSIS — E860 Dehydration: Secondary | ICD-10-CM | POA: Diagnosis present

## 2021-09-28 DIAGNOSIS — R739 Hyperglycemia, unspecified: Principal | ICD-10-CM

## 2021-09-28 DIAGNOSIS — E1165 Type 2 diabetes mellitus with hyperglycemia: Secondary | ICD-10-CM | POA: Diagnosis present

## 2021-09-28 DIAGNOSIS — Z8249 Family history of ischemic heart disease and other diseases of the circulatory system: Secondary | ICD-10-CM

## 2021-09-28 DIAGNOSIS — D509 Iron deficiency anemia, unspecified: Secondary | ICD-10-CM | POA: Diagnosis present

## 2021-09-28 DIAGNOSIS — R7989 Other specified abnormal findings of blood chemistry: Secondary | ICD-10-CM | POA: Diagnosis present

## 2021-09-28 DIAGNOSIS — E669 Obesity, unspecified: Secondary | ICD-10-CM | POA: Diagnosis present

## 2021-09-28 DIAGNOSIS — Z6833 Body mass index (BMI) 33.0-33.9, adult: Secondary | ICD-10-CM | POA: Diagnosis not present

## 2021-09-28 DIAGNOSIS — G4733 Obstructive sleep apnea (adult) (pediatric): Secondary | ICD-10-CM | POA: Diagnosis present

## 2021-09-28 DIAGNOSIS — Z833 Family history of diabetes mellitus: Secondary | ICD-10-CM

## 2021-09-28 DIAGNOSIS — Z801 Family history of malignant neoplasm of trachea, bronchus and lung: Secondary | ICD-10-CM

## 2021-09-28 DIAGNOSIS — Z7982 Long term (current) use of aspirin: Secondary | ICD-10-CM | POA: Diagnosis not present

## 2021-09-28 DIAGNOSIS — R778 Other specified abnormalities of plasma proteins: Secondary | ICD-10-CM | POA: Diagnosis present

## 2021-09-28 LAB — URINALYSIS, ROUTINE W REFLEX MICROSCOPIC
Bacteria, UA: NONE SEEN
Bilirubin Urine: NEGATIVE
Glucose, UA: >=500 mg/dL — AB
Ketones, ur: 5 mg/dL — AB
Leukocytes,Ua: NEGATIVE
Nitrite: NEGATIVE
Protein, ur: 30 mg/dL — AB
Specific Gravity, Urine: 1.027 (ref 1.005–1.030)
Squamous Epithelial / HPF: NONE SEEN (ref 0–5)
pH: 7 (ref 5.0–8.0)

## 2021-09-28 LAB — GLUCOSE, CAPILLARY
Glucose-Capillary: 442 mg/dL — ABNORMAL HIGH (ref 70–99)
Glucose-Capillary: 274 mg/dL — ABNORMAL HIGH (ref 70–99)
Glucose-Capillary: 346 mg/dL — ABNORMAL HIGH (ref 70–99)
Glucose-Capillary: 169 mg/dL — ABNORMAL HIGH (ref 70–99)
Glucose-Capillary: 137 mg/dL — ABNORMAL HIGH (ref 70–99)
Glucose-Capillary: 172 mg/dL — ABNORMAL HIGH (ref 70–99)
Glucose-Capillary: 204 mg/dL — ABNORMAL HIGH (ref 70–99)
Glucose-Capillary: 233 mg/dL — ABNORMAL HIGH (ref 70–99)
Glucose-Capillary: 215 mg/dL — ABNORMAL HIGH (ref 70–99)
Glucose-Capillary: 213 mg/dL — ABNORMAL HIGH (ref 70–99)

## 2021-09-28 LAB — BETA-HYDROXYBUTYRIC ACID: Beta-Hydroxybutyric Acid: 0.86 mmol/L — ABNORMAL HIGH (ref 0.05–0.27)

## 2021-09-28 LAB — BASIC METABOLIC PANEL
Anion gap: 9 (ref 5–15)
Anion gap: 5 (ref 5–15)
BUN: 19 mg/dL (ref 6–20)
BUN: 19 mg/dL (ref 6–20)
CO2: 26 mmol/L (ref 22–32)
CO2: 29 mmol/L (ref 22–32)
Calcium: 9.3 mg/dL (ref 8.9–10.3)
Calcium: 9.2 mg/dL (ref 8.9–10.3)
Chloride: 96 mmol/L — ABNORMAL LOW (ref 98–111)
Chloride: 104 mmol/L (ref 98–111)
Creatinine, Ser: 1.27 mg/dL — ABNORMAL HIGH (ref 0.61–1.24)
Creatinine, Ser: 1.21 mg/dL (ref 0.61–1.24)
GFR, Estimated: >60 mL/min (ref >60)
GFR, Estimated: >60 mL/min (ref >60)
Glucose, Bld: 814 mg/dL (ref 70–99)
Glucose, Bld: 162 mg/dL — ABNORMAL HIGH (ref 70–99)
Potassium: 4.7 mmol/L (ref 3.5–5.1)
Potassium: 3.3 mmol/L — ABNORMAL LOW (ref 3.5–5.1)
Sodium: 131 mmol/L — ABNORMAL LOW (ref 135–145)
Sodium: 138 mmol/L (ref 135–145)

## 2021-09-28 LAB — CBC
HCT: 36.7 % — ABNORMAL LOW (ref 39.0–52.0)
Hemoglobin: 11.9 g/dL — ABNORMAL LOW (ref 13.0–17.0)
MCH: 25.3 pg — ABNORMAL LOW (ref 26.0–34.0)
MCHC: 32.4 g/dL (ref 30.0–36.0)
MCV: 78.1 fL — ABNORMAL LOW (ref 80.0–100.0)
Platelets: 388 10*3/uL (ref 150–400)
RBC: 4.7 MIL/uL (ref 4.22–5.81)
RDW: 14.1 % (ref 11.5–15.5)
WBC: 6.3 10*3/uL (ref 4.0–10.5)
nRBC: 0 % (ref 0.0–0.2)

## 2021-09-28 LAB — MAGNESIUM: Magnesium: 2.3 mg/dL (ref 1.7–2.4)

## 2021-09-28 LAB — CBG MONITORING, ED
Glucose-Capillary: >600 mg/dL (ref 70–99)
Glucose-Capillary: 543 mg/dL (ref 70–99)
Glucose-Capillary: 475 mg/dL — ABNORMAL HIGH (ref 70–99)

## 2021-09-28 LAB — MRSA NEXT GEN BY PCR, NASAL: MRSA by PCR Next Gen: NOT DETECTED

## 2021-09-28 LAB — BRAIN NATRIURETIC PEPTIDE: B Natriuretic Peptide: 43.2 pg/mL (ref 0.0–100.0)

## 2021-09-28 LAB — TROPONIN I (HIGH SENSITIVITY): Troponin I (High Sensitivity): 13 ng/L (ref <18)

## 2021-09-28 LAB — OSMOLALITY: Osmolality: 324 mOsm/kg (ref 275–295)

## 2021-09-28 MED ORDER — INSULIN DEGLUDEC 100 UNIT/ML ~~LOC~~ SOPN
10.0000 [IU] | PEN_INJECTOR | Freq: Every day | SUBCUTANEOUS | Status: DC
Start: 2021-09-28 — End: 2021-09-28
  Filled 2021-09-28: qty 3

## 2021-09-28 MED ORDER — ROSUVASTATIN CALCIUM 10 MG PO TABS
20.0000 mg | ORAL_TABLET | Freq: Every evening | ORAL | Status: DC
  Administered 2021-09-28 – 2021-09-29 (×2): 20 mg via ORAL
  Filled 2021-09-28 (×2): qty 2
  Filled 2021-09-28: qty 1

## 2021-09-28 MED ORDER — INSULIN GLARGINE-YFGN 100 UNIT/ML ~~LOC~~ SOLN
10.0000 [IU] | Freq: Every day | SUBCUTANEOUS | Status: DC
  Filled 2021-09-28: qty 0.1

## 2021-09-28 MED ORDER — INSULIN ASPART 100 UNIT/ML IJ SOLN
0.0000 [IU] | Freq: Three times a day (TID) | INTRAMUSCULAR | Status: DC
  Administered 2021-09-28: 7 [IU] via SUBCUTANEOUS
  Administered 2021-09-29: 15 [IU] via SUBCUTANEOUS
  Administered 2021-09-29: 20 [IU] via SUBCUTANEOUS
  Administered 2021-09-29: 11 [IU] via SUBCUTANEOUS
  Administered 2021-09-29: 15 [IU] via SUBCUTANEOUS
  Administered 2021-09-30: 7 [IU] via SUBCUTANEOUS
  Filled 2021-09-28 (×6): qty 1

## 2021-09-28 MED ORDER — LOSARTAN POTASSIUM 50 MG PO TABS
50.0000 mg | ORAL_TABLET | Freq: Every day | ORAL | Status: DC
  Administered 2021-09-29 – 2021-09-30 (×2): 50 mg via ORAL
  Filled 2021-09-28 (×3): qty 1

## 2021-09-28 MED ORDER — POTASSIUM CHLORIDE CRYS ER 20 MEQ PO TBCR
40.0000 meq | EXTENDED_RELEASE_TABLET | Freq: Once | ORAL | Status: AC
Start: 2021-09-28 — End: 2021-09-28
  Administered 2021-09-28: 40 meq via ORAL
  Filled 2021-09-28: qty 2

## 2021-09-28 MED ORDER — INSULIN ASPART 100 UNIT/ML IJ SOLN
10.0000 [IU] | Freq: Once | INTRAMUSCULAR | Status: AC
  Administered 2021-09-28: 10 [IU] via INTRAVENOUS
  Filled 2021-09-28: qty 1

## 2021-09-28 MED ORDER — CHLORHEXIDINE GLUCONATE CLOTH 2 % EX PADS
6.0000 | MEDICATED_PAD | Freq: Every day | CUTANEOUS | Status: DC
Start: 2021-09-29 — End: 2021-09-29

## 2021-09-28 MED ORDER — DEXTROSE 50 % IV SOLN: 0.0000 mL | INTRAVENOUS | Status: DC | PRN

## 2021-09-28 MED ORDER — DEXTROSE IN LACTATED RINGERS 5 % IV SOLN: INTRAVENOUS | Status: DC

## 2021-09-28 MED ORDER — SODIUM CHLORIDE 0.9 % IV BOLUS
1000.0000 mL | Freq: Once | INTRAVENOUS | Status: AC
  Administered 2021-09-28: 1000 mL via INTRAVENOUS

## 2021-09-28 MED ORDER — HYDRALAZINE HCL 10 MG PO TABS
10.0000 mg | ORAL_TABLET | Freq: Four times a day (QID) | ORAL | Status: DC | PRN
  Administered 2021-09-28: 10 mg via ORAL
  Filled 2021-09-28 (×2): qty 1

## 2021-09-28 MED ORDER — INSULIN REGULAR(HUMAN) IN NACL 100-0.9 UT/100ML-% IV SOLN
INTRAVENOUS | Status: DC
  Administered 2021-09-28: 15 [IU]/h via INTRAVENOUS
  Filled 2021-09-28: qty 100

## 2021-09-28 MED ORDER — INSULIN GLARGINE-YFGN 100 UNIT/ML ~~LOC~~ SOLN
10.0000 [IU] | Freq: Every day | SUBCUTANEOUS | Status: DC
  Administered 2021-09-28: 10 [IU] via SUBCUTANEOUS
  Filled 2021-09-28 (×2): qty 0.1

## 2021-09-28 MED ORDER — ENOXAPARIN SODIUM 80 MG/0.8ML IJ SOSY
0.5000 mg/kg | PREFILLED_SYRINGE | Freq: Every day | INTRAMUSCULAR | Status: DC
  Administered 2021-09-28 – 2021-09-29 (×2): 65 mg via SUBCUTANEOUS
  Filled 2021-09-28: qty 0.65
  Filled 2021-09-28: qty 0.8

## 2021-09-28 MED ORDER — LACTATED RINGERS IV SOLN: INTRAVENOUS | Status: DC

## 2021-09-28 NOTE — Assessment & Plan Note (Signed)
-   Does not meet criteria for AKI - Presumed secondary to mild dehydration - Continue with LR 125 mL/h and insulin gtt. - Repeat BMP scheduled for day of admission at 1600

## 2021-09-28 NOTE — ED Triage Notes (Signed)
Pt in via EMS from work with c/o DKA. EMS reports cbg reading high. Pt with #18g to right AC, pt was given of fluids., 189/101.

## 2021-09-28 NOTE — Assessment & Plan Note (Addendum)
-   Mild HHS - Holding home oral antihyperglycemic agents at this time - Insulin GGT initiated - Scheduled BMP for 1600 on day of admission with the following notes: Patient presenting with blood glusose of 800+ and does not meet criteria for DKA. On insulin gtt, need to recheck electrolytes. Do not cancel this lab order.  - LR 125 mL/h initiated - Hypoglycemic protocol in place per order set - Diabetic coordinator has been consulted and dietitian has been consulted for diet education ordered - Admit to stepdown, inpatient

## 2021-09-28 NOTE — Progress Notes (Signed)
This RN notified MD Cox per secure chat regarding a dose of HTN medication for BP 130/113. MD Cox replied she will address medication once med rec is complete and she is finished reviewing chart. Notification was sent by this RN for acknowledgment of HTN needing to be addressed and that I am aware of patient's needs.

## 2021-09-28 NOTE — Progress Notes (Signed)
Inpatient Diabetes Program Recommendations  AACE/ADA: New Consensus Statement on Inpatient Glycemic Control (2015)  Target Ranges:  Prepandial:   less than 140 mg/dL      Peak postprandial:   less than 180 mg/dL (1-2 hours)      Critically ill patients:  140 - 180 mg/dL   Lab Results  Component Value Date   GLUCAP 543 (HH) 09/28/2021   HGBA1C 9.3 (H) 03/14/2019    Review of Glycemic Control  Latest Reference Range & Units 09/28/21 09:59 09/28/21 12:14  Glucose-Capillary 70 - 99 mg/dL >716 (HH) 967 (HH)   Diabetes history: DM  Outpatient Diabetes medications:  Metformin 1000 mg bid, Januvia 100 mg daily Current orders for Inpatient glycemic control:  IV insulin  Inpatient Diabetes Program Recommendations:    Note patient admitted with elevated blood sugars.  IV insulin started.  A1C pending.  Will f/u on 09/29/21.   Thanks,  Beryl Meager, RN, BC-ADM Inpatient Diabetes Coordinator Pager [phone]  (8a-5p)

## 2021-09-28 NOTE — H&P (Signed)
History and Physical   Gabriel Waters WCB:[id number] DOB: 07-10-1975 DOA: 09/28/2021  PCP: [name], NP  Outpatient Specialists: Dr. Campbell Lerner, general surgery Patient coming from: Work-up via EMS  I have personally briefly reviewed patient's old medical records in [facility] EMR.  Chief Concern: Hyperglycemia  HPI: Gabriel Waters is a 46 year old male with non-insulin-dependent diabetes mellitus, hypertension, obesity, who presents emergency department for chief concerns of hyperglycemia.  Initial vitals in the emergency department show temperature of 98.1, respiration rate of 20, heart rate of 98, blood pressure 160/94, SPO2 96% on room air.  Serum sodium is 131, potassium 4.7, chloride 96, bicarb 26, BUN of 10, serum creatinine 1.27, GFR greater than 60, WBC was 6.3, hemoglobin 11.9, platelets 388.  Nonfasting blood glucose 814.  Per ED report patient has forgotten to take his oral antihyperglycemic agents for the past 2 to 3 days.  ED treatment: Aspirin 10 units one-time dose, sodium chloride 1 L bolus.  At bedside he is able to tell me his full name, his age, the current calendar year and he knows he is in the hospital.  He reports he has not taken his diabetic medications for 2 days.  He reports he did not take his blood pressure medicine on day of admission.  He states that he was late for work today and forgot to take his medications.  He denies chest pain, shortness of breath, dysuria, hematuria, cough, diarrhea.  Social history: Is at home by himself.  He denies tobacco, EtOH, recreational drug use.  He is currently working as a Production designer, theatre/television/film at [employer].  ROS: Constitutional: no weight change, no fever ENT/Mouth: no sore throat, no rhinorrhea Eyes: no eye pain, no vision changes Cardiovascular: no chest pain, no dyspnea,  no edema, no palpitations Respiratory: no cough, no sputum, no wheezing Gastrointestinal: no nausea, no vomiting, no diarrhea, no  constipation Genitourinary: no urinary incontinence, no dysuria, no hematuria, + polyuria Musculoskeletal: no arthralgias, no myalgias Skin: no skin lesions, no pruritus, Neuro: + weakness, no loss of consciousness, no syncope Psych: no anxiety, no depression, + decrease appetite Heme/Lymph: no bruising, no bleeding  ED Course: Discussed with emergency medicine provider, patient requiring hospitalization for chief concerns of hyperglycemia.  Assessment/Plan  Principal Problem:   Hyperosmolar hyperglycemic state (HHS) (HCC) Active Problems:   Hyperglycemia due to type 2 diabetes mellitus (HCC)   Hyperlipidemia   OSA (obstructive sleep apnea)   Hypertension   Elevated serum creatinine   Obesity (BMI 30.0-34.9)   Assessment and Plan:  * Hyperosmolar hyperglycemic state (HHS) (HCC) - Mild HHS - Holding home oral antihyperglycemic agents at this time - Insulin GGT initiated - Scheduled BMP for 1600 on day of admission with the following notes: Patient presenting with blood glusose of 800+ and does not meet criteria for DKA. On insulin gtt, need to recheck electrolytes. Do not cancel this lab order.  - LR 125 mL/h initiated - Hypoglycemic protocol in place per order set - Diabetic coordinator has been consulted and dietitian has been consulted for diet education ordered - Admit to stepdown, inpatient  Obesity (BMI 30.0-34.9) - This complicates overall care and prognosis.   Elevated serum creatinine - Does not meet criteria for AKI - Presumed secondary to mild dehydration - Continue with LR 125 mL/h and insulin gtt. - Repeat BMP scheduled for day of admission at 1600  Hypertension - Home Losartan 50 mg daily resumed  OSA (obstructive sleep apnea) - Patient reports he was told  he had mild sleep apnea and has not been recommended to initiate CPAP at this time - Recommend safe and monitored weight loss in conjunction with working with outpatient PCP - The patient is asked to  make an attempt to improve diet and exercise patterns to aid in medical management of this problem. - Patient endorses understanding and compliance  Hyperlipidemia - Rosuvastatin 20 mg daily resumed  Hyperglycemia due to type 2 diabetes mellitus (HCC) - Patient takes metformin 1000 mg p.o. twice daily, Sitagliptin 100 mg daily  Chart reviewed.   DVT prophylaxis: Enoxaparin Code Status: Full code Diet: N.p.o. until patient is off insulin drip Family Communication: No Disposition Plan: Pending clinical course Consults called: None at this time Admission status: Stepdown, inpatient  Past Medical History:  Diagnosis Date   Acute pancreatitis 09/13/2013   DIABETES MELLITUS, TYPE II, UNCONTROLLED 01/02/2010   ELEVATED BLOOD PRESSURE 12/31/2009   Hypertension    Pancreatitis 09/13/2013   Sleep apnea    Past Surgical History:  Procedure Laterality Date   CHOLECYSTECTOMY, LAPAROSCOPIC  03/15/2019   WISDOM TOOTH EXTRACTION     Social History:  reports that he has never smoked. He has never used smokeless tobacco. He reports current alcohol use. He reports that he does not use drugs.  Allergies  Allergen Reactions   Bydureon [Exenatide] Other (See Comments)    pancreatitis   Invokana [Canagliflozin] Other (See Comments)    Yeast balanitis   Lipitor [Atorvastatin] Other (See Comments)    Arthralgias.   Lisinopril Other (See Comments)    pancreatitis   Family History  Problem Relation Age of Onset   Diabetes Father    Heart disease Father    Emphysema Father    Sarcoidosis Father    Heart disease Mother    Allergies Mother    Lung cancer Mother    Family history: Family history reviewed and pertinent for diabetes in father.  Prior to Admission medications   Medication Sig Start Date End Date Taking? Authorizing Provider  acetaminophen (TYLENOL) 500 MG tablet Take 500-1,000 mg by mouth every 6 (six) hours as needed for mild pain or fever.    [provider]   Alogliptin Benzoate 12.5 MG TABS Take 12.5 mg by mouth daily.    [provider]  aspirin 81 MG EC tablet Take by mouth. 04/14/19   [provider]  losartan (COZAAR) 50 MG tablet Take 1 tablet (50 mg total) by mouth daily. 02/03/18   Burchette, Elberta Fortis, MD  meloxicam (MOBIC) 7.5 MG tablet Take 7.5 mg by mouth daily. 04/16/21   [provider]  metFORMIN (GLUCOPHAGE) 1000 MG tablet Take 1,000 mg by mouth 2 (two) times daily.    [provider]  Multiple Vitamins-Minerals (ALIVE MULTI-VITAMIN PO) Take by mouth.    [provider]  oxyCODONE-acetaminophen (PERCOCET/ROXICET) 5-325 MG tablet Take 1 tablet by mouth every 6 (six) hours as needed for moderate pain or severe pain. 03/17/19   Enedina Finner, MD  rosuvastatin (CRESTOR) 20 MG tablet Take 1 tablet (20 mg total) by mouth daily. 02/03/18   Burchette, Elberta Fortis, MD  sitaGLIPtin (JANUVIA) 100 MG tablet Take by mouth. 04/14/19   [provider]   Physical Exam: Vitals:   09/28/21 1200 09/28/21 1300 09/28/21 1404 09/28/21 1430  BP: 136/84 (!) 140/81 (!) 172/91 (!) 130/113  Pulse: 92 90  96  Resp: 16 19 14 15   Temp:   97.9 F (36.6 C)   TempSrc:   Oral  SpO2: 94% 94% 97% 100%  Weight:   119.9 kg   Height:       Constitutional: appears older than chronological age, NAD, calm, comfortable Eyes: PERRL, lids and conjunctivae normal ENMT: Mucous membranes are moist. Posterior pharynx clear of any exudate or lesions. Age-appropriate dentition. Hearing appropriate Neck: normal, supple, no masses, no thyromegaly Respiratory: clear to auscultation bilaterally, no wheezing, no crackles. Normal respiratory effort. No accessory muscle use.  Cardiovascular: Regular rate and rhythm, no murmurs / rubs / gallops. No extremity edema. 2+ pedal pulses. No carotid bruits.  Abdomen: Obese abdomen, no tenderness, no masses palpated, no hepatosplenomegaly. Bowel sounds positive.  Musculoskeletal: no clubbing /  cyanosis. No joint deformity upper and lower extremities. Good ROM, no contractures, no atrophy. Normal muscle tone.  Skin: no rashes, lesions, ulcers. No induration Neurologic: Sensation intact. Strength 5/5 in all 4.  Psychiatric: Normal judgment and insight. Alert and oriented x 3. Normal mood.   EKG: independently reviewed, showing sinus rhythm with rate of 97, QTc 444  Chest x-ray on Admission: I personally reviewed and I agree with radiologist reading as below.  DG Chest 2 View  Result Date: 09/28/2021 CLINICAL DATA:  Exertional dyspnea EXAM: CHEST - 2 VIEW COMPARISON:  03/14/2019 FINDINGS: The heart size and mediastinal contours are within normal limits. Both lungs are clear. The visualized skeletal structures are unremarkable. IMPRESSION: No active cardiopulmonary disease. Electronically Signed   By: Ernie Avena M.D.   On: 09/28/2021 11:27    Labs on Admission: I have personally reviewed following labs  CBC: Recent Labs  Lab 09/28/21 1010  WBC 6.3  HGB 11.9*  HCT 36.7*  MCV 78.1*  PLT 388   Basic Metabolic Panel: Recent Labs  Lab 09/28/21 1010  NA 131*  K 4.7  CL 96*  CO2 26  GLUCOSE 814*  BUN 19  CREATININE 1.27*  CALCIUM 9.3   GFR: Estimated Creatinine Clearance: 99 mL/min (A) (by C-G formula based on SCr of 1.27 mg/dL (H)).  CBG: Recent Labs  Lab 09/28/21 0959 09/28/21 1214 09/28/21 1313 09/28/21 1411 09/28/21 1445  GLUCAP >600* 543* 475* 442* 346*   Urine analysis:    Component Value Date/Time   COLORURINE STRAW (A) 09/28/2021 1010   APPEARANCEUR CLEAR (A) 09/28/2021 1010   LABSPEC 1.027 09/28/2021 1010   PHURINE 7.0 09/28/2021 1010   GLUCOSEU >=500 (A) 09/28/2021 1010   HGBUR SMALL (A) 09/28/2021 1010   BILIRUBINUR NEGATIVE 09/28/2021 1010   KETONESUR 5 (A) 09/28/2021 1010   PROTEINUR 30 (A) 09/28/2021 1010   UROBILINOGEN 1.0 07/18/2014 1322   NITRITE NEGATIVE 09/28/2021 1010   LEUKOCYTESUR NEGATIVE 09/28/2021 1010   CRITICAL  CARE Performed by: Dr. Sedalia Muta  Total critical care time: 35 minutes  Critical care time was exclusive of separately billable procedures and treating other patients.  Critical care was necessary to treat or prevent imminent or life-threatening deterioration.  Critical care was time spent personally by me on the following activities: development of treatment plan with patient and/or surrogate as well as nursing, discussions with consultants, evaluation of patient's response to treatment, examination of patient, obtaining history from patient or surrogate, ordering and performing treatments and interventions, ordering and review of laboratory studies, ordering and review of radiographic studies, pulse oximetry and re-evaluation of patient's condition.  Dr. Sedalia Muta [facility]  If 7PM-7AM, please contact overnight-coverage provider If 7AM-7PM, please contact day coverage provider [email or web address]  09/28/2021, 3:51 PM

## 2021-09-28 NOTE — Hospital Course (Addendum)
Gabriel Waters is a 46 year old male with non-insulin-dependent diabetes mellitus, hypertension, obesity, who presents emergency department for chief concerns of hyperglycemia.  Initial vitals in the emergency department show temperature of 98.1, respiration rate of 20, heart rate of 98, blood pressure 160/94, SPO2 96% on room air.  Serum sodium is 131, potassium 4.7, chloride 96, bicarb 26, BUN of 10, serum creatinine 1.27, GFR greater than 60, WBC was 6.3, hemoglobin 11.9, platelets 388.  Nonfasting blood glucose 814.  Per ED report patient has forgotten to take his oral antihyperglycemic agents for the past 2 to 3 days.  ED treatment: Aspirin 10 units one-time dose, sodium chloride 1 L bolus.

## 2021-09-28 NOTE — Assessment & Plan Note (Signed)
-   Patient takes metformin 1000 mg p.o. twice daily, Sitagliptin 100 mg daily

## 2021-09-28 NOTE — Assessment & Plan Note (Addendum)
-   Patient reports he was told he had mild sleep apnea and has not been recommended to initiate CPAP at this time - Recommend safe and monitored weight loss in conjunction with working with outpatient PCP - The patient is asked to make an attempt to improve diet and exercise patterns to aid in medical management of this problem. - Patient endorses understanding and compliance

## 2021-09-28 NOTE — Progress Notes (Signed)
ED RN to call be back bc she is at patient bedside placing additional PIV.

## 2021-09-28 NOTE — Assessment & Plan Note (Addendum)
-   Home Losartan 50 mg daily resumed

## 2021-09-28 NOTE — Assessment & Plan Note (Signed)
-   Rosuvastatin 20 mg daily resumed 

## 2021-09-28 NOTE — ED Triage Notes (Signed)
Pt here via [facility] with hyperglycemia. Pt states he was at work and began being SOB and feeling off. Pt has a hx of DM type 1 but does not take insulin.

## 2021-09-28 NOTE — ED Provider Notes (Signed)
[facility] Provider Note    Event Date/Time   First MD Initiated Contact with Patient 09/28/21 1036     (approximate)   History   Hyperglycemia   HPI  Gabriel Waters is a 46 y.o. male history of type 2 diabetes and Charcot foot  Yesterday he was fitted for a boot by podiatry for his Charcot foot.  He reports that he has been fairly busy, today he was working at [employer]  He is gone 48 hours without taking any of his diabetes medications.  While at work today he noticed that he was feeling very lightheaded and low on energy especially with exerting himself.  Also he noticed his vision seemed blurry, mental tasks seem to be a little slower than typical.  He also reports a couple days ago he felt like he was just a little slow in his mentation.  He usually checks his blood sugar about once a week and reports it was pretty normal about a week ago   On review of records from February 2021 patient was discharged after having gangrenous cholecystitis.  History of type 2 diabetes noted is uncontrolled at the time and previous pancreatitis and hypertension  Does report he has been thirsty and urinating fairly excessively for the last several days.  No headache.  No trouble speaking.  No weakness in a single arm or leg or trouble aside from feeling like his vision has been intermittently a little blurry, currently is back to normal and also had a feeling that he got a little bit confused a couple days ago and seem just a little slow at work today  Physical Exam   Triage Vital Signs: ED Triage Vitals  Enc Vitals Group     BP 09/28/21 1000 (!) 160/94     Pulse Rate 09/28/21 1000 98     Resp 09/28/21 1000 20     Temp 09/28/21 1000 98.1 F (36.7 C)     Temp Source 09/28/21 1000 Oral     SpO2 09/28/21 1000 96 %     Weight 09/28/21 1001 283 lb 1.1 oz (128.4 kg)     Height 09/28/21 1001 6\' 2"  (1.88 m)     Head Circumference --      Peak Flow --      Pain Score --       Pain Loc --      Pain Edu? --      Excl. in [place]? --     Most recent vital signs: Vitals:   09/28/21 1404 09/28/21 1430  BP: (!) 172/91 (!) 130/113  Pulse:  96  Resp: 14 15  Temp: 97.9 F (36.6 C)   SpO2: 97% 100%     General: Awake, no distress.  Very pleasant.  No acute distress CV:  Good peripheral perfusion.  Normal heart soundsResp:  Normal effort.  Clear bilateral Dukas membranes are dry Abd:  No distention.  Soft nontender nondistended Other:  Fitted boot right lower extremity.  Warm well-perfused extremities   ED Results / Procedures / Treatments   Labs (all labs ordered are listed, but only abnormal results are displayed) Labs Reviewed  BASIC METABOLIC PANEL - Abnormal; Notable for the following components:      Result Value   Sodium 131 (*)    Chloride 96 (*)    Glucose, Bld 814 (*)    Creatinine, Ser 1.27 (*)    All other components within normal limits  CBC - Abnormal;  Notable for the following components:   Hemoglobin 11.9 (*)    HCT 36.7 (*)    MCV 78.1 (*)    MCH 25.3 (*)    All other components within normal limits  URINALYSIS, ROUTINE W REFLEX MICROSCOPIC - Abnormal; Notable for the following components:   Color, Urine STRAW (*)    APPearance CLEAR (*)    Glucose, UA >=500 (*)    Hgb urine dipstick SMALL (*)    Ketones, ur 5 (*)    Protein, ur 30 (*)    All other components within normal limits  OSMOLALITY - Abnormal; Notable for the following components:   Osmolality 324 (*)    All other components within normal limits  BETA-HYDROXYBUTYRIC ACID - Abnormal; Notable for the following components:   Beta-Hydroxybutyric Acid 0.86 (*)    All other components within normal limits  GLUCOSE, CAPILLARY - Abnormal; Notable for the following components:   Glucose-Capillary 442 (*)    All other components within normal limits  GLUCOSE, CAPILLARY - Abnormal; Notable for the following components:   Glucose-Capillary 346 (*)    All other components  within normal limits  CBG MONITORING, ED - Abnormal; Notable for the following components:   Glucose-Capillary >600 (*)    All other components within normal limits  CBG MONITORING, ED - Abnormal; Notable for the following components:   Glucose-Capillary 543 (*)    All other components within normal limits  CBG MONITORING, ED - Abnormal; Notable for the following components:   Glucose-Capillary 475 (*)    All other components within normal limits  MRSA NEXT GEN BY PCR, NASAL  BRAIN NATRIURETIC PEPTIDE  HIV ANTIBODY (ROUTINE TESTING W REFLEX)  BASIC METABOLIC PANEL  CBG MONITORING, ED  TROPONIN I (HIGH SENSITIVITY)     EKG  Interpreted by me at 1005 heart rate 95 QRS 80 QTc 440 Normal sinus rhythm, 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  No evidence of acute ischemia denoted.  Morphology similar to February 2021   RADIOLOGY   Chest x-ray interpreted by me as negative for acute finding  PROCEDURES:  Critical Care performed: No  Procedures   MEDICATIONS ORDERED IN ED: Medications  rosuvastatin (CRESTOR) tablet 20 mg (has no administration in time range)  enoxaparin (LOVENOX) injection 65 mg (has no administration in time range)  insulin regular, human (MYXREDLIN) 100 units/ 100 mL infusion (7.5 Units/hr Intravenous Infusion Verify 09/28/21 1542)  lactated ringers infusion ( Intravenous Infusion Verify 09/28/21 1542)  dextrose 5 % in lactated ringers infusion (has no administration in time range)  dextrose 50 % solution 0-50 mL (has no administration in time range)  Chlorhexidine Gluconate Cloth 2 % PADS 6 each (has no administration in time range)  sodium chloride 0.9 % bolus 1,000 mL (0 mLs Intravenous Stopped 09/28/21 1249)  insulin aspart (novoLOG) injection 10 Units (10 Units Intravenous Given 09/28/21 1113)     IMPRESSION / MDM / ASSESSMENT AND PLAN / ED COURSE  I reviewed the triage vital signs and the nursing notes.                              Differential diagnosis  includes, but is not limited to, hyperglycemia, hyperosmolar hyperglycemic state, DKA, diabetes complications dehydration etc.  Also in the relation he has some intermittent shortness of breath with exertion EKG checked which shows no evidence of acute ischemia, and chest x-ray without evidence of pulmonary edema or volume overload.  He  appears slightly hypovolemic with somewhat dry mucous membranes and his report of polyuria.  ----------------------------------------- 12:28 PM on 09/28/2021 ----------------------------------------- Labs do not indicate DKA.  Suspect hyperosmolar hyperglycemic state with very mild symptomatology of blurring of vision and he reports a slight slowness or a slight mental confusion that been or was at least present a couple days ago.  Certainly no evidence of coma or significant somnolence or depressed mental status.  Having received IV fluid bolus and 10 units of insulin his blood sugar has improved to the 500 range but remains notably elevated.  Slightly elevated beta hydroxy and ketones present in the urine.  Given the patient's presentation and the severity of his hyperglycemia I think the patient would benefit from observation or inpatient treatment for correction of his hyperglycemia and metabolic abnormalities.  Patient is understanding agreeable with the plan.  Thus far showing good results with treatments provided.  Counseled with her hospitalist Dr. Sedalia Muta who is agreeable and understand plan for admission,   Patient's presentation is most consistent with acute presentation with potential threat to life or bodily function.  The patient is on the cardiac monitor to evaluate for evidence of arrhythmia and/or significant heart rate changes.      FINAL CLINICAL IMPRESSION(S) / ED DIAGNOSES   Final diagnoses:  Hyperglycemia  Dehydration  Hyperglycemic hyperosmolar state   Rx / DC Orders   ED Discharge Orders     None        Note:  This document was  prepared using Dragon voice recognition software and may include unintentional dictation errors.   Sharyn Creamer, MD 09/28/21 [phone]

## 2021-09-28 NOTE — Progress Notes (Signed)
[facility] Progress Note  Reviewed repeat scheduled BMP showing glucose 162, potassium 3.3.  Addendum plan: - Home long-acting insulin degludec/equivalent of 10 units subcutaneous daily resumed now - Insulin GGT can be discontinued approximately 2 hours after long-acting insulin has been given - Potassium chloride 40 mill equivalent one-time dose has been ordered - Serum magnesium level check ordered, a.m. team to follow-up - Continue BMP recheck in the a.m. - Message nursing staff and she has acknowledged  Dr. Sedalia Muta

## 2021-09-28 NOTE — Assessment & Plan Note (Signed)
-   This complicates overall care and prognosis.  

## 2021-09-29 DIAGNOSIS — E1100 Type 2 diabetes mellitus with hyperosmolarity without nonketotic hyperglycemic-hyperosmolar coma (NKHHC): Secondary | ICD-10-CM | POA: Diagnosis not present

## 2021-09-29 DIAGNOSIS — E1165 Type 2 diabetes mellitus with hyperglycemia: Secondary | ICD-10-CM

## 2021-09-29 DIAGNOSIS — E669 Obesity, unspecified: Secondary | ICD-10-CM | POA: Diagnosis not present

## 2021-09-29 LAB — BASIC METABOLIC PANEL
Anion gap: 3 — ABNORMAL LOW (ref 5–15)
BUN: 21 mg/dL — ABNORMAL HIGH (ref 6–20)
CO2: 30 mmol/L (ref 22–32)
Calcium: 8.6 mg/dL — ABNORMAL LOW (ref 8.9–10.3)
Chloride: 105 mmol/L (ref 98–111)
Creatinine, Ser: 1.29 mg/dL — ABNORMAL HIGH (ref 0.61–1.24)
GFR, Estimated: >60 mL/min (ref >60)
Glucose, Bld: 308 mg/dL — ABNORMAL HIGH (ref 70–99)
Potassium: 3.7 mmol/L (ref 3.5–5.1)
Sodium: 138 mmol/L (ref 135–145)

## 2021-09-29 LAB — GLUCOSE, CAPILLARY
Glucose-Capillary: 321 mg/dL — ABNORMAL HIGH (ref 70–99)
Glucose-Capillary: 412 mg/dL — ABNORMAL HIGH (ref 70–99)
Glucose-Capillary: 388 mg/dL — ABNORMAL HIGH (ref 70–99)
Glucose-Capillary: 311 mg/dL — ABNORMAL HIGH (ref 70–99)
Glucose-Capillary: 260 mg/dL — ABNORMAL HIGH (ref 70–99)

## 2021-09-29 LAB — HEMOGLOBIN A1C
Hgb A1c MFr Bld: 13.2 % — ABNORMAL HIGH (ref 4.8–5.6)
Mean Plasma Glucose: 332.14 mg/dL

## 2021-09-29 LAB — GLUCOSE, RANDOM: Glucose, Bld: 436 mg/dL — ABNORMAL HIGH (ref 70–99)

## 2021-09-29 MED ORDER — INSULIN GLARGINE-YFGN 100 UNIT/ML ~~LOC~~ SOLN: 30.0000 [IU] | Freq: Every day | SUBCUTANEOUS | Status: DC

## 2021-09-29 MED ORDER — INSULIN GLARGINE-YFGN 100 UNIT/ML ~~LOC~~ SOLN
15.0000 [IU] | Freq: Every day | SUBCUTANEOUS | Status: DC
  Administered 2021-09-29: 15 [IU] via SUBCUTANEOUS
  Filled 2021-09-29 (×2): qty 0.15

## 2021-09-29 MED ORDER — INSULIN ASPART 100 UNIT/ML IJ SOLN
5.0000 [IU] | Freq: Three times a day (TID) | INTRAMUSCULAR | Status: DC
  Administered 2021-09-29 – 2021-09-30 (×3): 5 [IU] via SUBCUTANEOUS
  Filled 2021-09-29 (×3): qty 1

## 2021-09-29 MED ORDER — INSULIN GLARGINE-YFGN 100 UNIT/ML ~~LOC~~ SOLN
30.0000 [IU] | Freq: Every day | SUBCUTANEOUS | Status: DC
  Administered 2021-09-29: 30 [IU] via SUBCUTANEOUS
  Filled 2021-09-29 (×3): qty 0.3

## 2021-09-29 MED ORDER — LOPERAMIDE HCL 2 MG PO CAPS
2.0000 mg | ORAL_CAPSULE | Freq: Once | ORAL | Status: AC
  Administered 2021-09-29: 2 mg via ORAL
  Filled 2021-09-29: qty 1

## 2021-09-29 NOTE — Progress Notes (Addendum)
Inpatient Diabetes Program Recommendations  AACE/ADA: New Consensus Statement on Inpatient Glycemic Control (2015)  Target Ranges:  Prepandial:   less than 140 mg/dL      Peak postprandial:   less than 180 mg/dL (1-2 hours)      Critically ill patients:  140 - 180 mg/dL   Lab Results  Component Value Date   GLUCAP 412 (H) 09/29/2021   HGBA1C 13.2 (H) 09/29/2021    Review of Glycemic Control  Latest Reference Range & Units 09/28/21 22:30 09/29/21 07:37 09/29/21 11:32  Glucose-Capillary 70 - 99 mg/dL 545 (H) 625 (H) 638 (H)   Diabetes history: DM 2 Outpatient Diabetes medications:  Tresiba 10 units daily, Metformin 1000 mg bid Current orders for Inpatient glycemic control:  Novolog 0-20 units tid with meals and HS Semglee 15 units daily  Inpatient Diabetes Program Recommendations:    Consider increasing Semglee to 30 units daily.  Consider adding Novolog meal coverage 5 units tid with meals (hold if patient eats less than 50% or NPO).   Thanks,  Beryl Meager, RN, BC-ADM Inpatient Diabetes Coordinator Pager [phone]  (8a-5p)  Addendum:  Spoke with patient by phone.  He is taking [place] at home but was not aware that it was insulin.  We discussed his current A1C and likely need for insulin to be increased at home.  Discussed goal A1C and blood sugar goals as well.  Patient has recently broke his foot and therefore has been sedentary.  He states that he does not check his blood sugars as frequently as needed.  He is interested in CGM for monitoring at discharge.  Discussed importance of glycemic control to prevent long term complications.  Will ask DM coordinator to f/u with patient on 09/30/21.

## 2021-09-29 NOTE — Progress Notes (Signed)
PROGRESS NOTE    Gabriel Waters  YKD:[id number] DOB: 08/19/1975 DOA: 09/28/2021 PCP: [name], NP    Assessment & Plan:   Principal Problem:   Hyperosmolar hyperglycemic state (HHS) (HCC) Active Problems:   Hyperglycemia due to type 2 diabetes mellitus (HCC)   Hyperlipidemia   OSA (obstructive sleep apnea)   Hypertension   Elevated serum creatinine   Obesity (BMI 30.0-34.9)  Assessment and Plan: Hyperosmolar hyperglycemic state: likely secondary to poorly controlled DM2. D/c IV insulin drip. Continue on glargine, aspart w/ meals, SSI w/ accuchecks   DM2: poorly controlled, HbA1c 13.2. Continue on glargine, aspart w/ meals, SSI w/ accuchecks. Will need insulin at d/c. DM coordinator is following    Obesity: BMI 33.9. Complicates overall care & prognosis    HTN: continue on losartan   OSA: mild OSA & was advised not use to CPAP as per pt. Encourage weight loss    HLD: continue on statin   Microcytic anemia: will check iron panel      DVT prophylaxis: lovenox  Code Status: full  Family Communication:  Disposition Plan: likely d/c back home   Level of care: Stepdown  Status is: Inpatient Remains inpatient appropriate because: severity of illness    Consultants:    Procedures:   Antimicrobials:   Subjective: Pt c/o fatigue   Objective: Vitals:   09/29/21 0600 09/29/21 0617 09/29/21 0700 09/29/21 0758  BP:  (!) 178/103 (!) 154/97 (!) 172/100  Pulse: 82 84 84 88  Resp: 17 16 16 14   Temp:      TempSrc:      SpO2: 97% 97% 97% 97%  Weight:      Height:        Intake/Output Summary (Last 24 hours) at 09/29/2021 0848 Last data filed at 09/29/2021 0820 Gross per 24 hour  Intake 2531.77 ml  Output 400 ml  Net 2131.77 ml   Filed Weights   09/28/21 1001 09/28/21 1404  Weight: 128.4 kg 119.9 kg    Examination:  General exam: Appears calm and comfortable  Respiratory system: Clear to auscultation. Respiratory effort normal. Cardiovascular  system: S1 & S2 +. No  rubs, gallops or clicks.  Gastrointestinal system: Abdomen is obese, soft and nontender. Normal bowel sounds heard. Central nervous system: Alert and oriented. Moves all extremities  Psychiatry: Judgement and insight appear normal. Flat mood and affect.     Data Reviewed: I have personally reviewed following labs and imaging studies  CBC: Recent Labs  Lab 09/28/21 1010  WBC 6.3  HGB 11.9*  HCT 36.7*  MCV 78.1*  PLT 388   Basic Metabolic Panel: Recent Labs  Lab 09/28/21 1010 09/28/21 1719 09/28/21 1844 09/29/21 0426  NA 131* 138  --  138  K 4.7 3.3*  --  3.7  CL 96* 104  --  105  CO2 26 29  --  30  GLUCOSE 814* 162*  --  308*  BUN 19 19  --  21*  CREATININE 1.27* 1.21  --  1.29*  CALCIUM 9.3 9.2  --  8.6*  MG  --   --  2.3  --    GFR: Estimated Creatinine Clearance: 97.4 mL/min (A) (by C-G formula based on SCr of 1.29 mg/dL (H)). Liver Function Tests: No results for input(s): "AST", "ALT", "ALKPHOS", "BILITOT", "PROT", "ALBUMIN" in the last 168 hours. No results for input(s): "LIPASE", "AMYLASE" in the last 168 hours. No results for input(s): "AMMONIA" in the last 168 hours. Coagulation Profile: No results  for input(s): "INR", "PROTIME" in the last 168 hours. Cardiac Enzymes: No results for input(s): "CKTOTAL", "CKMB", "CKMBINDEX", "TROPONINI" in the last 168 hours. BNP (last 3 results) No results for input(s): "PROBNP" in the last 8760 hours. HbA1C: No results for input(s): "HGBA1C" in the last 72 hours. CBG: Recent Labs  Lab 09/28/21 1928 09/28/21 2046 09/28/21 2149 09/28/21 2230 09/29/21 0737  GLUCAP 204* 233* 215* 213* 321*   Lipid Profile: No results for input(s): "CHOL", "HDL", "LDLCALC", "TRIG", "CHOLHDL", "LDLDIRECT" in the last 72 hours. Thyroid Function Tests: No results for input(s): "TSH", "T4TOTAL", "FREET4", "T3FREE", "THYROIDAB" in the last 72 hours. Anemia Panel: No results for input(s): "VITAMINB12", "FOLATE",  "FERRITIN", "TIBC", "IRON", "RETICCTPCT" in the last 72 hours. Sepsis Labs: No results for input(s): "PROCALCITON", "LATICACIDVEN" in the last 168 hours.  Recent Results (from the past 240 hour(s))  MRSA Next Gen by PCR, Nasal     Status: None   Collection Time: 09/28/21  2:12 PM   Specimen: Nasal Mucosa; Nasal Swab  Result Value Ref Range Status   MRSA by PCR Next Gen NOT DETECTED NOT DETECTED Final    Comment: (NOTE) The GeneXpert MRSA Assay (FDA approved for NASAL specimens only), is one component of a comprehensive MRSA colonization surveillance program. It is not intended to diagnose MRSA infection nor to guide or monitor treatment for MRSA infections. Test performance is not FDA approved in patients less than 49 years old. Performed at [facility], [address] [place], [place] [address]          Radiology Studies: DG Chest 2 View  Result Date: 09/28/2021 CLINICAL DATA:  Exertional dyspnea EXAM: CHEST - 2 VIEW COMPARISON:  03/14/2019 FINDINGS: The heart size and mediastinal contours are within normal limits. Both lungs are clear. The visualized skeletal structures are unremarkable. IMPRESSION: No active cardiopulmonary disease. Electronically Signed   By: Ernie Avena M.D.   On: 09/28/2021 11:27        Scheduled Meds:  Chlorhexidine Gluconate Cloth  6 each Topical Q0600   enoxaparin (LOVENOX) injection  0.5 mg/kg Subcutaneous QHS   insulin aspart  0-20 Units Subcutaneous TID AC & HS   insulin glargine-yfgn  10 Units Subcutaneous Daily   losartan  50 mg Oral Daily   rosuvastatin  20 mg Oral QPM   Continuous Infusions:  dextrose 5% lactated ringers Stopped (09/28/21 2150)   insulin Stopped (09/28/21 2148)     LOS: 1 day    Time spent: 37 mins     Charise Killian, MD [facility] Pager [phone]  If 7PM-7AM, please contact night-coverage [email or web address] 09/29/2021, 8:48 AM

## 2021-09-30 ENCOUNTER — Telehealth (HOSPITAL_COMMUNITY): Payer: Self-pay | Admitting: Pharmacy Technician

## 2021-09-30 ENCOUNTER — Other Ambulatory Visit (HOSPITAL_COMMUNITY): Payer: Self-pay

## 2021-09-30 DIAGNOSIS — E1100 Type 2 diabetes mellitus with hyperosmolarity without nonketotic hyperglycemic-hyperosmolar coma (NKHHC): Secondary | ICD-10-CM | POA: Diagnosis not present

## 2021-09-30 DIAGNOSIS — E1165 Type 2 diabetes mellitus with hyperglycemia: Secondary | ICD-10-CM | POA: Diagnosis not present

## 2021-09-30 DIAGNOSIS — E669 Obesity, unspecified: Secondary | ICD-10-CM | POA: Diagnosis not present

## 2021-09-30 LAB — BASIC METABOLIC PANEL
Anion gap: 10 (ref 5–15)
BUN: 22 mg/dL — ABNORMAL HIGH (ref 6–20)
CO2: 26 mmol/L (ref 22–32)
Calcium: 8.7 mg/dL — ABNORMAL LOW (ref 8.9–10.3)
Chloride: 104 mmol/L (ref 98–111)
Creatinine, Ser: 0.94 mg/dL (ref 0.61–1.24)
GFR, Estimated: >60 mL/min (ref >60)
Glucose, Bld: 80 mg/dL (ref 70–99)
Potassium: 3.4 mmol/L — ABNORMAL LOW (ref 3.5–5.1)
Sodium: 140 mmol/L (ref 135–145)

## 2021-09-30 LAB — GLUCOSE, CAPILLARY
Glucose-Capillary: 107 mg/dL — ABNORMAL HIGH (ref 70–99)
Glucose-Capillary: 238 mg/dL — ABNORMAL HIGH (ref 70–99)

## 2021-09-30 LAB — CBC
HCT: 35.2 % — ABNORMAL LOW (ref 39.0–52.0)
Hemoglobin: 12.4 g/dL — ABNORMAL LOW (ref 13.0–17.0)
MCH: 27.5 pg (ref 26.0–34.0)
MCHC: 35.2 g/dL (ref 30.0–36.0)
MCV: 78 fL — ABNORMAL LOW (ref 80.0–100.0)
Platelets: 396 10*3/uL (ref 150–400)
RBC: 4.51 MIL/uL (ref 4.22–5.81)
RDW: 14.3 % (ref 11.5–15.5)
WBC: 7 10*3/uL (ref 4.0–10.5)
nRBC: 0 % (ref 0.0–0.2)

## 2021-09-30 LAB — FERRITIN: Ferritin: 701 ng/mL — ABNORMAL HIGH (ref 24–336)

## 2021-09-30 LAB — IRON AND TIBC
Iron: 48 ug/dL (ref 45–182)
Saturation Ratios: 20 % (ref 17.9–39.5)
TIBC: 241 ug/dL — ABNORMAL LOW (ref 250–450)
UIBC: 193 ug/dL

## 2021-09-30 LAB — HIV ANTIBODY (ROUTINE TESTING W REFLEX): HIV Screen 4th Generation wRfx: NONREACTIVE

## 2021-09-30 MED ORDER — "PEN NEEDLES 3/16"" 31G X 5 MM MISC": 0 refills | Status: AC

## 2021-09-30 MED ORDER — INSULIN DEGLUDEC 100 UNIT/ML ~~LOC~~ SOPN: 35.0000 [IU] | PEN_INJECTOR | Freq: Every day | SUBCUTANEOUS | 0 refills | Status: AC

## 2021-09-30 MED ORDER — NOVOLOG FLEXPEN 100 UNIT/ML ~~LOC~~ SOPN: 8.0000 [IU] | PEN_INJECTOR | Freq: Three times a day (TID) | SUBCUTANEOUS | 0 refills | Status: DC

## 2021-09-30 MED ORDER — ACCU-CHEK SOFTCLIX LANCETS MISC: 0 refills | Status: AC

## 2021-09-30 MED ORDER — FREESTYLE LIBRE 3 SENSOR MISC: 0 refills | Status: AC

## 2021-09-30 NOTE — Progress Notes (Signed)
AVS given and reviewed with pt. Medications discussed. All questions answered to satisfaction. Pt verbalized understanding of information given. Pt escorted off the unit with all belongings via wheelchair by volunteer services.  

## 2021-09-30 NOTE — Telephone Encounter (Signed)
Pharmacy Patient Advocate Encounter  Insurance verification completed.    The patient is insured through [employer]   The patient is currently admitted and ran test claims for the following: Novolog, Novolin, Humalog, Lantus, Basaglar, Levemir.  Copays and coinsurance results were relayed to Inpatient clinical team.

## 2021-09-30 NOTE — TOC Initial Note (Signed)
Transition of Care [facility]) - Initial/Assessment Note    Patient Details  Name: Gabriel Waters MRN: [id number] Date of Birth: 11/11/1975  Transition of Care [facility]) CM/SW Contact:    Chapman Fitch, RN Phone Number: 09/30/2021, 2:07 PM  Clinical Narrative:                   Transition of Care (TOC) Screening Note   Patient Details  Name: Gabriel Waters Date of Birth: [birth year]   Transition of Care [facility]) CM/SW Contact:    Chapman Fitch, RN Phone Number: 09/30/2021, 2:07 PM    Transition of Care Department [facility]) has reviewed patient and no TOC needs have been identified at this time. We will continue to monitor patient advancement through interdisciplinary progression rounds. If new patient transition needs arise, please place a TOC consult.         Patient Goals and CMS Choice        Expected Discharge Plan and Services           Expected Discharge Date: 09/30/21                                    Prior Living Arrangements/Services                       Activities of Daily Living Home Assistive Devices/Equipment: Splint (specify type) Paulino Rily) ADL Screening (condition at time of admission) Patient's cognitive ability adequate to safely complete daily activities?: Yes Is the patient deaf or have difficulty hearing?: No Does the patient have difficulty seeing, even when wearing glasses/contacts?: No Does the patient have difficulty concentrating, remembering, or making decisions?: No Patient able to express need for assistance with ADLs?: Yes Does the patient have difficulty dressing or bathing?: No Independently performs ADLs?: Yes (appropriate for developmental age) Does the patient have difficulty walking or climbing stairs?: No Weakness of Legs: None Weakness of Arms/Hands: None  Permission Sought/Granted                  Emotional Assessment              Admission diagnosis:  Dehydration [E86.0] Hyperglycemia  [R73.9] Hyperosmolar hyperglycemic state (HHS) (HCC) [E11.00] Patient Active Problem List   Diagnosis Date Noted   Hyperosmolar hyperglycemic state (HHS) (HCC) 09/28/2021   Elevated serum creatinine 09/28/2021   Obesity (BMI 30.0-34.9) 09/28/2021   Status post laparoscopic cholecystectomy 03/22/2019   AKI (acute kidney injury) (HCC) 03/14/2019   Preoperative clearance 03/14/2019   Abnormal ultrasound of liver 03/14/2019   Hypertension    Low testosterone 06/08/2013   OSA (obstructive sleep apnea) 04/05/2013   Erectile dysfunction 01/04/2013   Severe obesity (BMI >= 40) (HCC) 12/05/2012   Hyperlipidemia 04/01/2010   Hyperglycemia due to type 2 diabetes mellitus (HCC) 01/02/2010   ABSCESS, NECK 12/31/2009   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 [place] [address] Phone: [phone] Fax: [phone]  [id number] - [place],  - [address] [place] [address] Phone: [phone] Fax:  [phone]     Social Determinants of Health (SDOH) Interventions    Readmission Risk Interventions     No data to display

## 2021-09-30 NOTE — Discharge Instructions (Signed)
Please see list of Endocrinology in [place] and [place]. Please check with insurance and see which providers are in network and could call and see if you can make appointment to establish care.  [facility] - Endocrinology and Diabetes [address] [place], [place] [address] [phone]  [facility] Endocrinology [address] [place]y Shears [place], [place] [address] [phone]  [facility] [address] [place], [place] [address] Phone - [phone]

## 2021-09-30 NOTE — Plan of Care (Signed)
  Problem: Pain Managment: Goal: General experience of comfort will improve Outcome: Progressing   Problem: Safety: Goal: Ability to remain free from injury will improve Outcome: Progressing   Problem: Skin Integrity: Goal: Risk for impaired skin integrity will decrease Outcome: Progressing   

## 2021-09-30 NOTE — Discharge Summary (Addendum)
Physician Discharge Summary  Gabriel Waters HXT:[id number] DOB: [birth year] DOA: 09/28/2021  PCP: Gabriel Hansen, NP  Admit date: 09/28/2021 Discharge date: 09/30/2021  Admitted From: home  Disposition:  home   Recommendations for Outpatient Follow-up:  Follow up with PCP in 1-2 weeks Will need a referral from pt's PCP to see a endocrinologist ASAP   Home Health: no  Equipment/Devices:  Discharge Condition: stable  CODE STATUS: full  Diet recommendation: Heart Healthy / Carb Modified  Brief/Interim Summary: HPI was taken from Dr. Sedalia Muta: [name] is a 46 year old male with non-insulin-dependent diabetes mellitus, hypertension, obesity, who presents emergency department for chief concerns of hyperglycemia.   Initial vitals in the emergency department show temperature of 98.1, respiration rate of 20, heart rate of 98, blood pressure 160/94, SPO2 96% on room air.   Serum sodium is 131, potassium 4.7, chloride 96, bicarb 26, BUN of 10, serum creatinine 1.27, GFR greater than 60, WBC was 6.3, hemoglobin 11.9, platelets 388.   Nonfasting blood glucose 814.   Per ED report patient has forgotten to take his oral antihyperglycemic agents for the past 2 to 3 days.   ED treatment: Aspirin 10 units one-time dose, sodium chloride 1 L bolus.   At bedside he is able to tell me his full name, his age, the current calendar year and he knows he is in the hospital.  He reports he has not taken his diabetic medications for 2 days.  He reports he did not take his blood pressure medicine on day of admission.  He states that he was late for work today and forgot to take his medications.   He denies chest pain, shortness of breath, dysuria, hematuria, cough, diarrhea.  As per Dr. Mayford Knife 9/10-9/11/23: Pt presented w/ hyperosmolar hyperglycemic state and was started on IV insulin drip. Insulin drip was weaned off and pt was started on sq insulin. Pt's HbA1c was 13.2. Pt did receive DM education while  inpatient. It was recommended that pt see an endocrinologist as an outpatient and pt verbalized his understanding. Pt was d/c home on tresiba, novolog pens, freestyle libre3 & continue on home dose of metformin. For more information, please see other progress notes.   Discharge Diagnoses:  Principal Problem:   Hyperosmolar hyperglycemic state (HHS) (HCC) Active Problems:   Hyperglycemia due to type 2 diabetes mellitus (HCC)   Hyperlipidemia   OSA (obstructive sleep apnea)   Hypertension   Elevated serum creatinine   Obesity (BMI 30.0-34.9)  Hyperosmolar hyperglycemic state: likely secondary to poorly controlled DM2. D/c IV insulin drip. Continue on glargine, aspart w/ meals, SSI w/ accuchecks while inpatient.    DM2: poorly controlled, HbA1c 13.2. Continue on glargine, aspart w/ meals, SSI w/ accuchecks. Will need insulin at d/c. DM coordinator is following   Hypokalemia: potassium given    Obesity: BMI 33.9. Complicates overall care & prognosis    HTN: continue on losartan   OSA: mild OSA & was advised not use to CPAP as per pt. Encourage weight loss    HLD: continue on statin   Microcytic anemia: iron is WNL.   Discharge Instructions  Discharge Instructions     Diet - low sodium heart healthy   Complete by: As directed    Diet Carb Modified   Complete by: As directed    Discharge instructions   Complete by: As directed    F/u w/ PCP in 1-2 weeks. Will need to get a referral from your PCP to see an endocrinologist  as soon as possible   Increase activity slowly   Complete by: As directed       Allergies as of 09/30/2021       Reactions   Bydureon [exenatide] Other (See Comments)   pancreatitis   Invokana [canagliflozin] Other (See Comments)   Yeast balanitis   Lipitor [atorvastatin] Other (See Comments)   Arthralgias.   Lisinopril Other (See Comments)   pancreatitis        Medication List     STOP taking these medications    Alogliptin Benzoate 12.5 MG  Tabs   meloxicam 7.5 MG tablet Commonly known as: MOBIC   oxyCODONE-acetaminophen 5-325 MG tablet Commonly known as: PERCOCET/ROXICET   sitaGLIPtin 100 MG tablet Commonly known as: JANUVIA       TAKE these medications    Accu-Chek Softclix Lancets lancets Check blood sugar three times a day. 30 day supply. No refills   acetaminophen 500 MG tablet Commonly known as: TYLENOL Take 500-1,000 mg by mouth every 6 (six) hours as needed for mild pain or fever.   ALIVE MULTI-VITAMIN PO Take by mouth.   aspirin EC 81 MG tablet Take by mouth.   FreeStyle Libre 3 Sensor Misc Place 1 sensor on the skin every 14 days. Use to check glucose continuously. 30 day supply. No refills   insulin degludec 100 UNIT/ML FlexTouch Pen Commonly known as: TRESIBA Inject 35 Units into the skin daily. What changed: how much to take   losartan 50 MG tablet Commonly known as: COZAAR Take 1 tablet (50 mg total) by mouth daily.   metFORMIN 1000 MG tablet Commonly known as: GLUCOPHAGE Take 1,000 mg by mouth 2 (two) times daily.   NovoLOG FlexPen 100 UNIT/ML FlexPen Generic drug: insulin aspart Inject 8 Units into the skin 3 (three) times daily with meals.   Pen Needles 3/16" 31G X 5 MM Misc Dispense enough pen needles for tresiba 35 units daily, novolog 8 units TID w/ meals x 30 days. No refills.   rosuvastatin 20 MG tablet Commonly known as: CRESTOR Take 1 tablet (20 mg total) by mouth daily.        Allergies  Allergen Reactions   Bydureon [Exenatide] Other (See Comments)    pancreatitis   Invokana [Canagliflozin] Other (See Comments)    Yeast balanitis   Lipitor [Atorvastatin] Other (See Comments)    Arthralgias.   Lisinopril Other (See Comments)    pancreatitis    Consultations:    Procedures/Studies: DG Chest 2 View  Result Date: 09/28/2021 CLINICAL DATA:  Exertional dyspnea EXAM: CHEST - 2 VIEW COMPARISON:  03/14/2019 FINDINGS: The heart size and mediastinal contours  are within normal limits. Both lungs are clear. The visualized skeletal structures are unremarkable. IMPRESSION: No active cardiopulmonary disease. Electronically Signed   By: Ernie Avena M.D.   On: 09/28/2021 11:27   (Echo, Carotid, EGD, Colonoscopy, ERCP)    Subjective: Pt denies any compliants    Discharge Exam: Vitals:   09/30/21 0447 09/30/21 0727  BP: 138/75 (!) 164/92  Pulse: 86 82  Resp: 17 14  Temp: 98.1 F (36.7 C) 98.4 F (36.9 C)  SpO2: 100% 100%   Vitals:   09/29/21 1519 09/29/21 1933 09/30/21 0447 09/30/21 0727  BP: (!) 172/94 (!) 163/85 138/75 (!) 164/92  Pulse: 95 87 86 82  Resp: 18 20 17 14   Temp: 98.4 F (36.9 C) 98 F (36.7 C) 98.1 F (36.7 C) 98.4 F (36.9 C)  TempSrc:  Oral  Oral  SpO2:  99% 99% 100% 100%  Weight:      Height:        General: Pt is alert, awake, not in acute distress Cardiovascular: S1/S2 +, no rubs, no gallops Respiratory: CTA bilaterally, no wheezing, no rhonchi Abdominal: Soft, NT, obese, bowel sounds + Extremities: no edema, no cyanosis    The results of significant diagnostics from this hospitalization (including imaging, microbiology, ancillary and laboratory) are listed below for reference.     Microbiology: Recent Results (from the past 240 hour(s))  MRSA Next Gen by PCR, Nasal     Status: None   Collection Time: 09/28/21  2:12 PM   Specimen: Nasal Mucosa; Nasal Swab  Result Value Ref Range Status   MRSA by PCR Next Gen NOT DETECTED NOT DETECTED Final    Comment: (NOTE) The GeneXpert MRSA Assay (FDA approved for NASAL specimens only), is one component of a comprehensive MRSA colonization surveillance program. It is not intended to diagnose MRSA infection nor to guide or monitor treatment for MRSA infections. Test performance is not FDA approved in patients less than 51 years old. Performed at [facility], [address] [place], [place] [address]      Labs: BNP (last 3 results) Recent  Labs    09/28/21 1010  BNP [id number]   Basic Metabolic Panel: Recent Labs  Lab 09/28/21 1010 09/28/21 1719 09/28/21 1844 09/29/21 0426 09/29/21 1148 09/30/21 0415  NA 131* 138  --  138  --  140  K 4.7 3.3*  --  3.7  --  3.4*  CL 96* 104  --  105  --  104  CO2 26 29  --  30  --  26  GLUCOSE 814* 162*  --  308* 436* 80  BUN 19 19  --  21*  --  22*  CREATININE 1.27* 1.21  --  1.29*  --  0.94  CALCIUM 9.3 9.2  --  8.6*  --  8.7*  MG  --   --  2.3  --   --   --    Liver Function Tests: No results for input(s): "AST", "ALT", "ALKPHOS", "BILITOT", "PROT", "ALBUMIN" in the last 168 hours. No results for input(s): "LIPASE", "AMYLASE" in the last 168 hours. No results for input(s): "AMMONIA" in the last 168 hours. CBC: Recent Labs  Lab 09/28/21 1010 09/30/21 0415  WBC 6.3 7.0  HGB 11.9* 12.4*  HCT 36.7* 35.2*  MCV 78.1* 78.0*  PLT 388 396   Cardiac Enzymes: No results for input(s): "CKTOTAL", "CKMB", "CKMBINDEX", "TROPONINI" in the last 168 hours. BNP: Invalid input(s): "POCBNP" CBG: Recent Labs  Lab 09/29/21 1320 09/29/21 1711 09/29/21 2056 09/30/21 0728 09/30/21 1140  GLUCAP 388* 311* 260* 107* 238*   D-Dimer No results for input(s): "DDIMER" in the last 72 hours. Hgb A1c Recent Labs    09/29/21 0426  HGBA1C 13.2*   Lipid Profile No results for input(s): "CHOL", "HDL", "LDLCALC", "TRIG", "CHOLHDL", "LDLDIRECT" in the last 72 hours. Thyroid function studies No results for input(s): "TSH", "T4TOTAL", "T3FREE", "THYROIDAB" in the last 72 hours.  Invalid input(s): "FREET3" Anemia work up Recent Labs    09/30/21 0415  FERRITIN 701*  TIBC 241*  IRON 48   Urinalysis    Component Value Date/Time   COLORURINE STRAW (A) 09/28/2021 1010   APPEARANCEUR CLEAR (A) 09/28/2021 1010   LABSPEC 1.027 09/28/2021 1010   PHURINE 7.0 09/28/2021 1010   GLUCOSEU >=500 (A) 09/28/2021 1010   HGBUR SMALL (A) 09/28/2021 1010  BILIRUBINUR NEGATIVE 09/28/2021 1010    KETONESUR 5 (A) 09/28/2021 1010   PROTEINUR 30 (A) 09/28/2021 1010   UROBILINOGEN 1.0 07/18/2014 1322   NITRITE NEGATIVE 09/28/2021 1010   LEUKOCYTESUR NEGATIVE 09/28/2021 1010   Sepsis Labs Recent Labs  Lab 09/28/21 1010 09/30/21 0415  WBC 6.3 7.0   Microbiology Recent Results (from the past 240 hour(s))  MRSA Next Gen by PCR, Nasal     Status: None   Collection Time: 09/28/21  2:12 PM   Specimen: Nasal Mucosa; Nasal Swab  Result Value Ref Range Status   MRSA by PCR Next Gen NOT DETECTED NOT DETECTED Final    Comment: (NOTE) The GeneXpert MRSA Assay (FDA approved for NASAL specimens only), is one component of a comprehensive MRSA colonization surveillance program. It is not intended to diagnose MRSA infection nor to guide or monitor treatment for MRSA infections. Test performance is not FDA approved in patients less than 79 years old. Performed at [facility], [address] [place], [place] [address]      Time coordinating discharge: Over 30 minutes  SIGNED:   Wyvonnia Dusky, MD  [facility] 09/30/2021, 2:04 PM Pager   If 7PM-7AM, please contact night-coverage [email or web address]

## 2021-09-30 NOTE — Inpatient Diabetes Management (Addendum)
Inpatient Diabetes Program Recommendations  AACE/ADA: New Consensus Statement on Inpatient Glycemic Control   Target Ranges:  Prepandial:   less than 140 mg/dL      Peak postprandial:   less than 180 mg/dL (1-2 hours)      Critically ill patients:  140 - 180 mg/dL    Latest Reference Range & Units 09/29/21 07:37 09/29/21 11:32 09/29/21 13:20 09/29/21 17:11 09/29/21 20:56 09/30/21 07:28  Glucose-Capillary 70 - 99 mg/dL 884 (H) 166 (H) 063 (H) 311 (H) 260 (H) 107 (H)    Latest Reference Range & Units 07/15/17 17:16 05/10/18 08:41 03/14/19 22:03 09/29/21 04:26  Hemoglobin A1C 4.8 - 5.6 % 13.6 ! 11.4 (H) 9.3 (H) 13.2 (H)    Latest Reference Range & Units 09/28/21 10:10  CO2 22 - 32 mmol/L 26  Glucose 70 - 99 mg/dL 016 (HH)  Anion gap 5 - 15  9   Review of Glycemic Control  Diabetes history: DM2 Outpatient Diabetes medications: Metformin 1000 mg BID, Januvia 100 mg daily (not taking; reports PCP stopped several months ago), Tresiba 10 units QAM (per patient report; did not realize it was insulin?) Current orders for Inpatient glycemic control: Semglee 30 units QHS, Novolog 5 units TID with meals, Novolog 0-20 units AC&HS  Inpatient Diabetes Program Recommendations:    Insulin: Patient received Semglee 15 units at 12:31 and Semglee 30 units at 22:17 on 09/29/21.  Fast CBG this morning is 107 mg/dl.   HbgA1C: A1C 13.2% on 09/29/21 indicating an average glucose of 332 mg/dl over the past 2-3 months.  Outpatient DM: At time of discharge, please consider prescribing Tresiba 35 units daily, Novolog 8 units TID with meals, plus continue Metformin 1000 mg BID.  NOTE: Patient admitted with [facility], initial glucose 814 mg/dl on 0/1/09, and was initially on IV insulin which has been transitioned to SQ insulin. Inpatient diabetes coordinator spoke with patient briefly over the phone on 09/29/21 and patient reported that he was taking [place] outpatient and was not aware that Evaristo Bury was an insulin. Will  plan to follow up with patient today.  Addendum 09/30/21@10 :74- Spoke with patient about diabetes and home regimen for diabetes control. Patient reports taking Metformin 1000 mg BID and Tresiba 10 units QAM as an outpatient for diabetes control. Patient reports taking DM medications as prescribed and he did not realize that the [place] was insulin. Inquired about Januvia (listed on med list) and patient reports that he was taking Januvia but his PCP stopped when [place] was started 3-4 months ago. Patient was only checking glucose 1-2 times per week. Discussed A1C results (13.2%) and explained that current A1C indicates an average glucose of 332 mg/dl over the past 2-3 months. Discussed glucose and A1C goals. Discussed importance of checking CBGs and maintaining good CBG control to prevent long-term and short-term complications. Explained how hyperglycemia leads to damage within blood vessels which lead to the common complications seen with uncontrolled diabetes. Stressed to the patient the importance of improving glycemic control to prevent further complications from uncontrolled diabetes. Discussed impact of nutrition, exercise, stress, sickness, and medications on diabetes control.  Discussed carbohydrates, carbohydrate goals per day and meal, along with portion sizes. Patient states he is interested in using FreeStyle Libre3 CGM. Discussed FreeStyle Radene Journey and how it works. Informed patient I would ask attending provider about giving him a sample and assisting to apply.   Patient verbalized understanding of information discussed and reports no further questions at this time related to diabetes.  Addendum 09/30/21@12 :50-Order  for FreeStyle Libre3 sample give by Dr. Mayford Knife.  Educated patient on FreeStyle Libre3 CGM regarding application and changing CGM sensor (alternate every 14 days on back of arms), 1 hour warm-up. Patient has been given Freestyle Libre3 sensor samples.  Provided educational packet  regarding FreeStyle Libre3 CGM. Assisted patient to apply to back of upper left arm at 12:55.  Explained that glucose readings will not be available until 1 hour after application. Patient plans to use Android phone FreeStyle Libre3 app to read [employer] sensor. Asked patient to have PCP continue to provide Rx for FreeStyle Libre3 sensors going forward. Asked patient to be sure to let PCP know about Radene Journey and allow provider to review reports from [place] app so the provider can use the information to continue to make adjustments with DM medications if needed. Patient verbalized understanding of information and has no questions at this time.   Thanks, Orlando Penner, RN, MSN, CDCES Diabetes Coordinator Inpatient Diabetes Program [phone] (Team Pager from 8am to 5pm)

## 2021-10-24 IMAGING — US US ABDOMEN LIMITED
1 series · 14 of 25 positions shown · non-contrast
Comparison: Ultrasound 09/13/2013

CLINICAL DATA: Right upper quadrant pain

EXAM:
ULTRASOUND ABDOMEN LIMITED RIGHT UPPER QUADRANT

[Series 1: us abdomen limited · 14 of 57 slices shown]
[im 1/57]
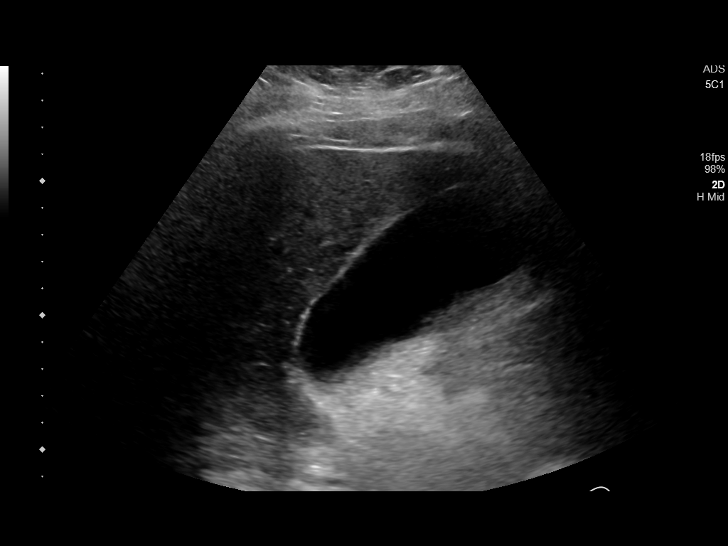
[im 5/57]
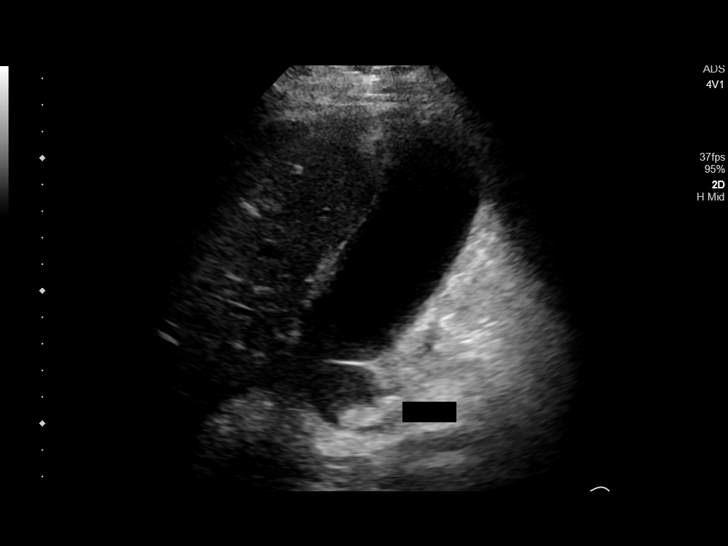
[im 10/57]
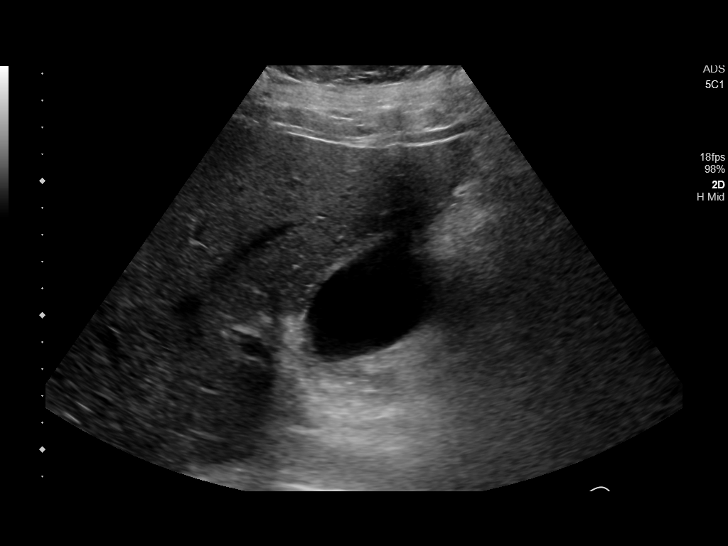
[im 15/57]
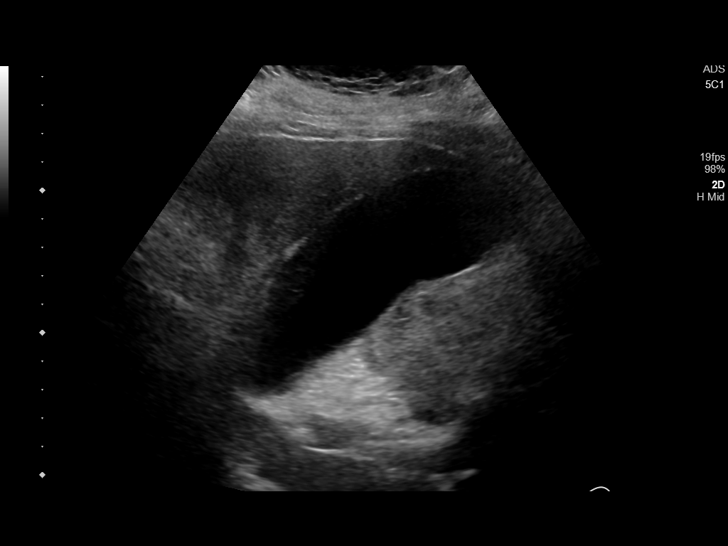
[im 19/57]
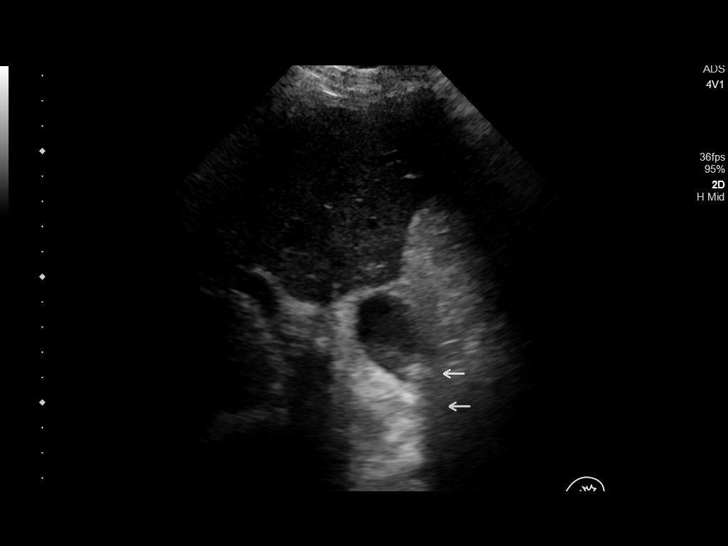
[im 22/57]
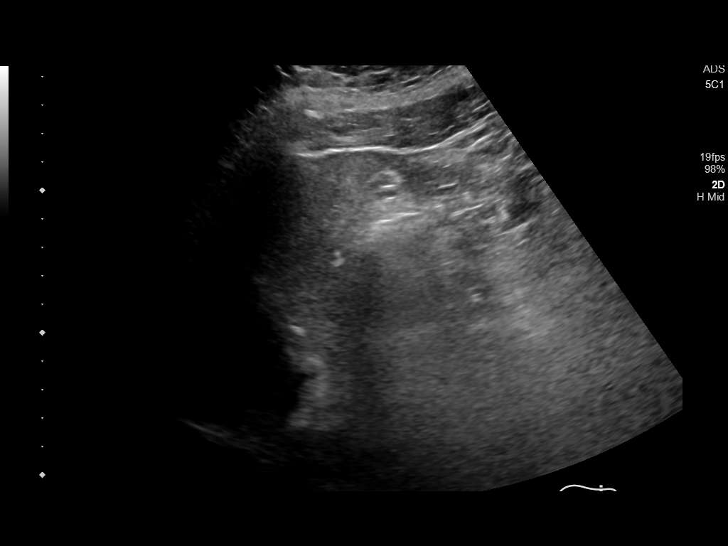
[im 26/57]
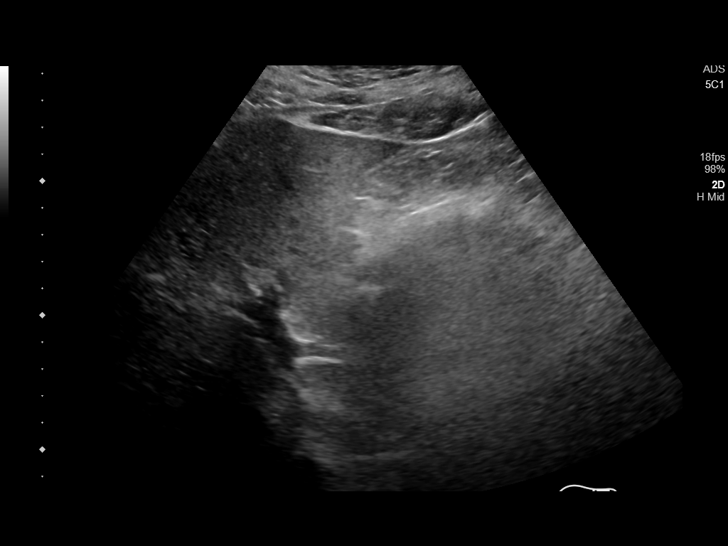
[im 31/57]
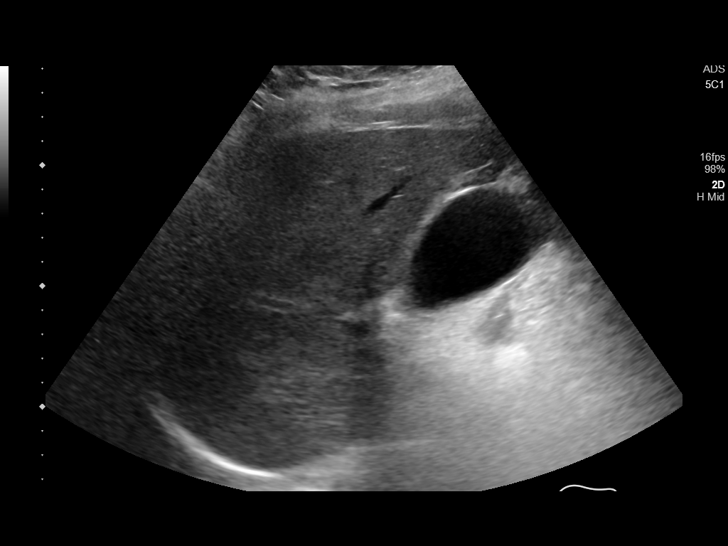
[im 36/57]
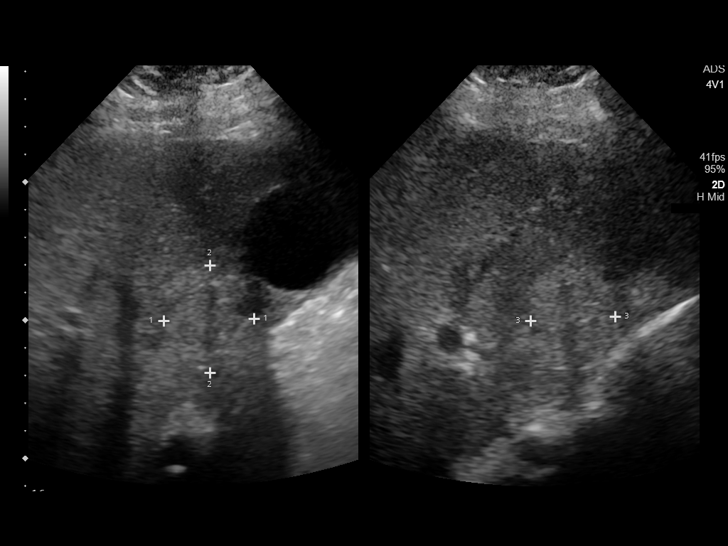
[im 38/57]
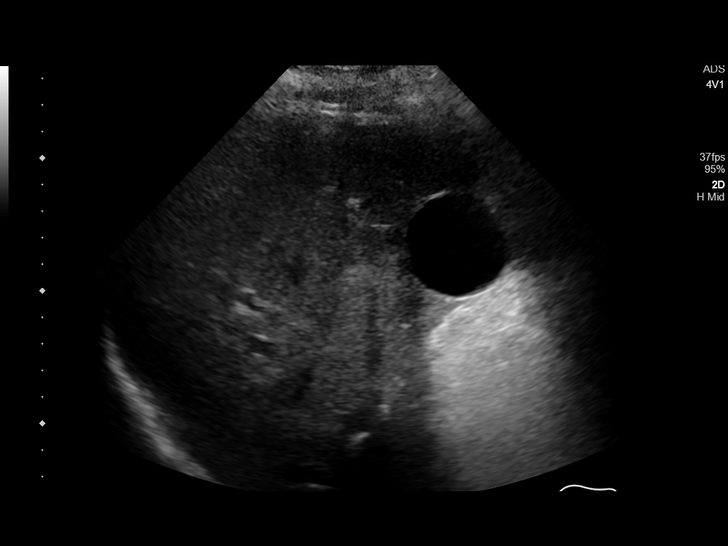
[im 43/57]
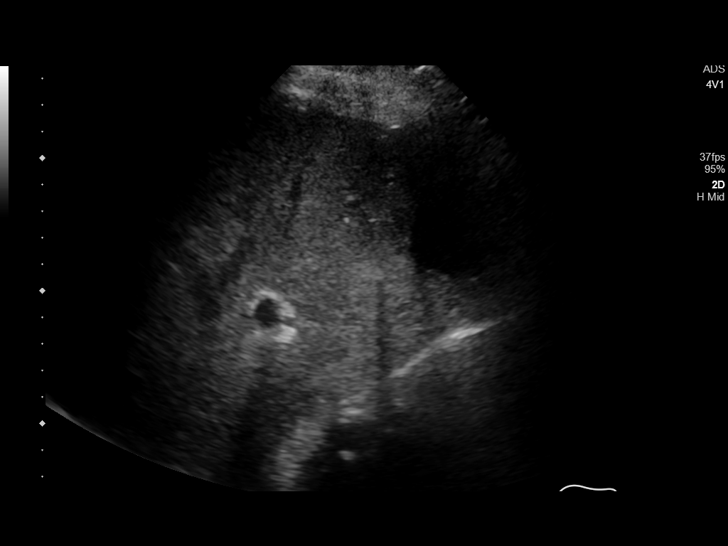
[im 47/57]
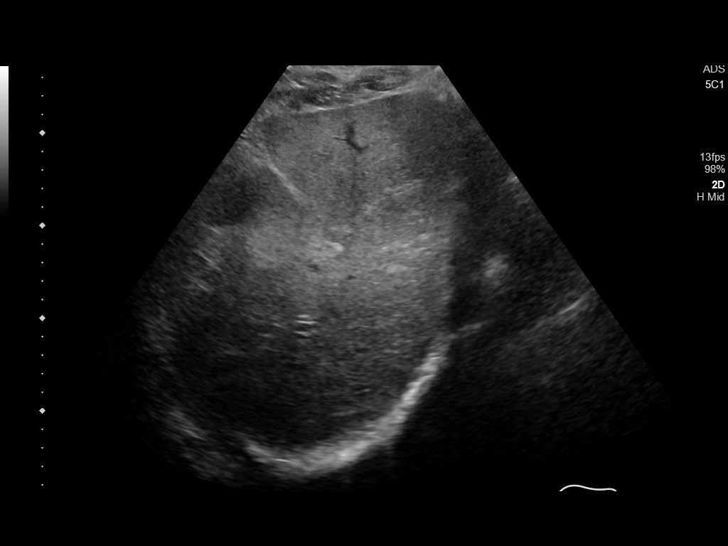
[im 52/57]
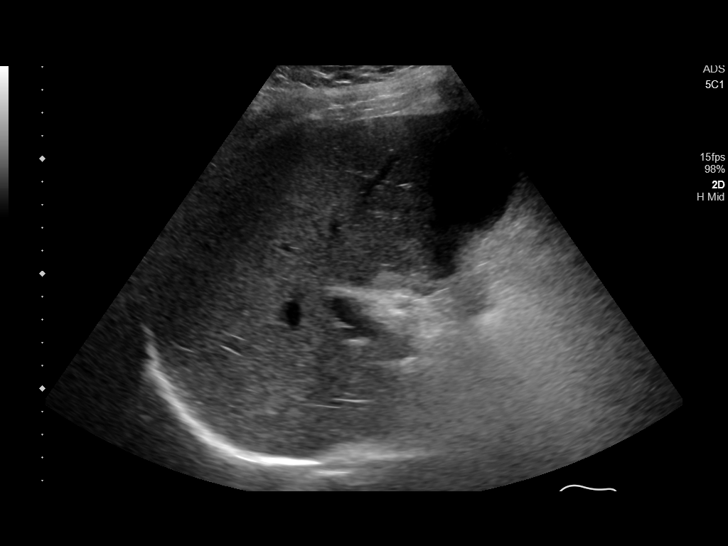
[im 57/57]
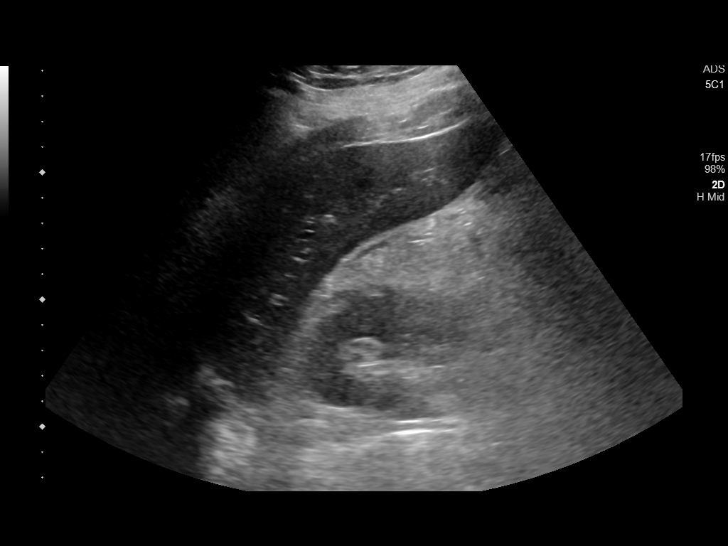

[14 of 25 positions shown; findings below may reference images not displayed]

FINDINGS: Gallbladder:

1.7 cm stone at the gallbladder neck. Normal wall thickness.
Negative sonographic Murphy

Common bile duct:

Diameter: 3 mm

Liver:

Heterogenous focal hyperechoic area within the right hepatic lobe
measuring 3.3 x 3.9 x 3.1 cm portal vein is patent on color Doppler
imaging with normal direction of blood flow towards the liver.

Other: None.
IMPRESSION: 1. Cholelithiasis without sonographic evidence for acute
cholecystitis or biliary dilatation.
2. Heterogenous 3.9 cm focal hyperechoic area in the right hepatic
lobe near the gallbladder fossa. This may represent an echogenic
mass versus heterogenous focal fat infiltration. When the patient is
clinically stable and able to follow directions and hold their
breath (preferably as an outpatient) further evaluation with
dedicated abdominal MRI should be considered.

## 2021-10-24 IMAGING — DX DG CHEST 1V PORT
1 series · 1 of 1 positions shown · non-contrast
Comparison: July 01, 2016.

CLINICAL DATA: Shortness of breath.

EXAM:
PORTABLE CHEST 1 VIEW

[chest ap]
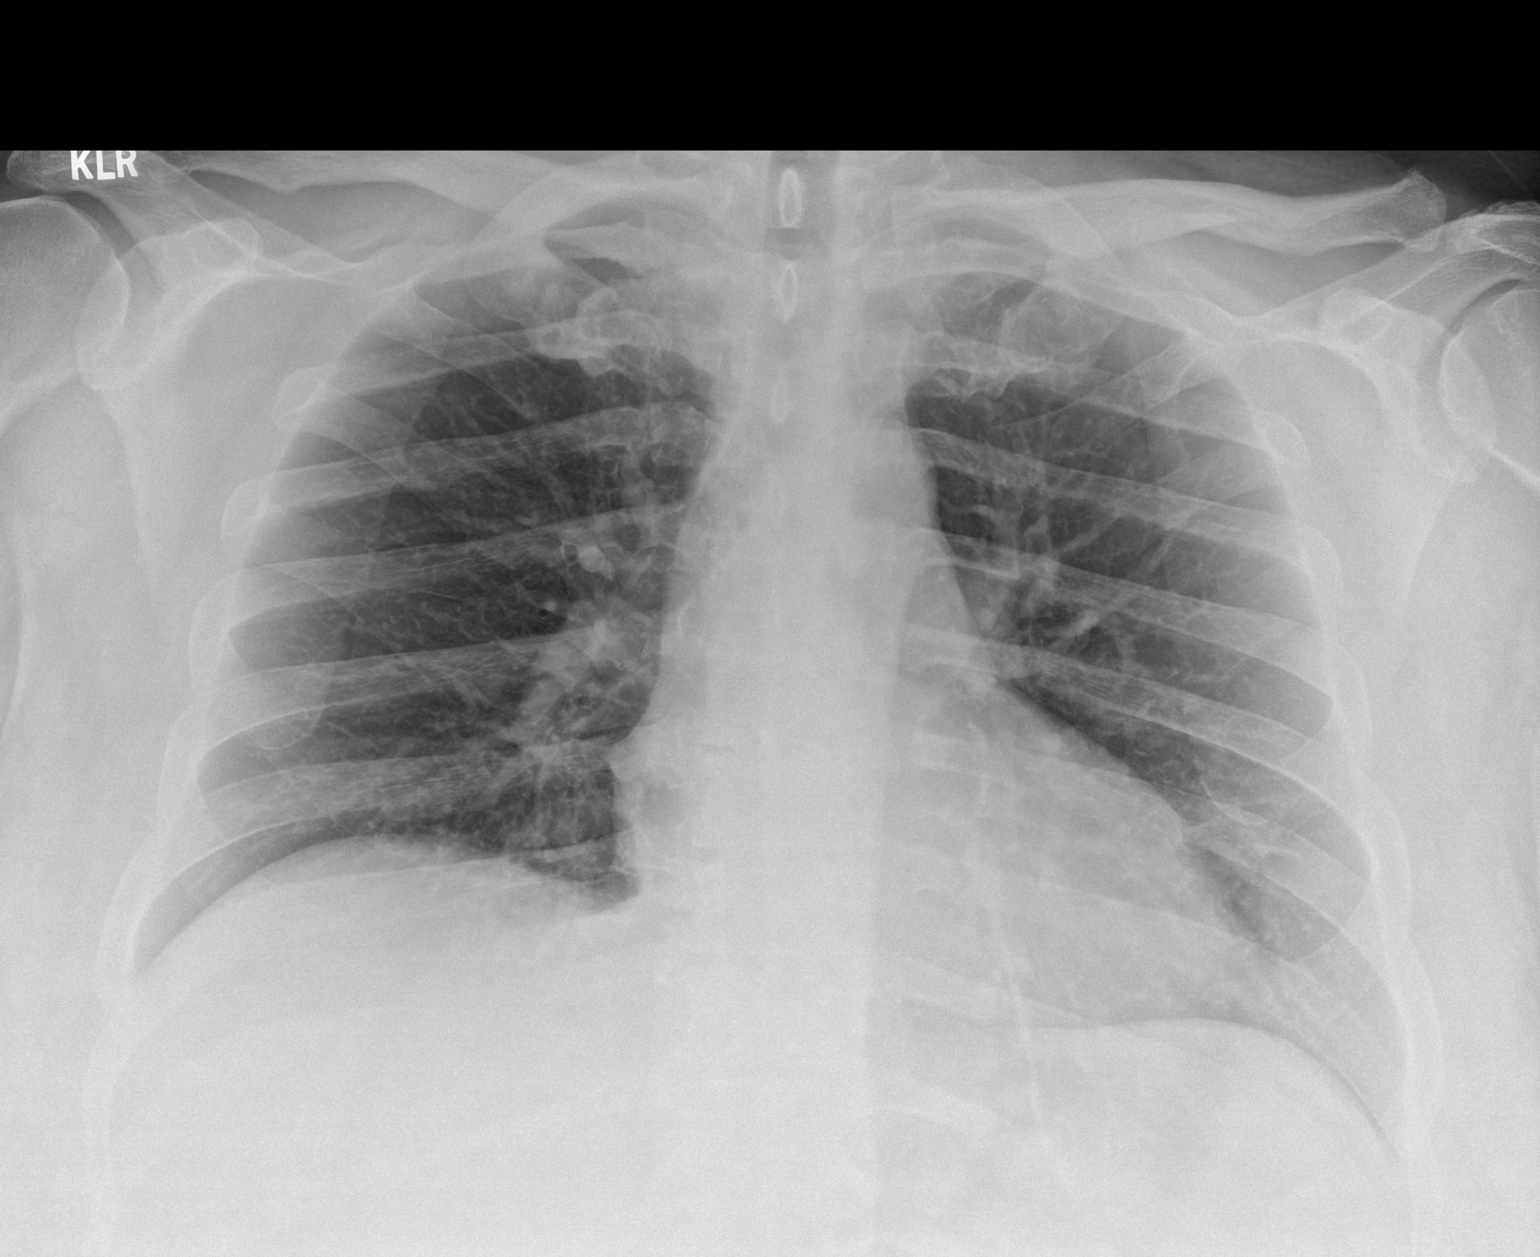

[1 of 1 positions shown; findings below may reference images not displayed]

FINDINGS: The heart size and mediastinal contours are within normal limits.
Both lungs are clear. No pneumothorax or pleural effusion is noted.
The visualized skeletal structures are unremarkable.
IMPRESSION: No active disease.

## 2021-11-07 ENCOUNTER — Encounter: Payer: Self-pay | Admitting: Gastroenterology

## 2021-11-25 ENCOUNTER — Ambulatory Visit (AMBULATORY_SURGERY_CENTER): Payer: Self-pay | Admitting: *Deleted

## 2021-11-25 VITALS — Ht 74.0 in | Wt 301.0 lb

## 2021-11-25 DIAGNOSIS — Z1211 Encounter for screening for malignant neoplasm of colon: Secondary | ICD-10-CM

## 2021-11-25 MED ORDER — NA SULFATE-K SULFATE-MG SULF 17.5-3.13-1.6 GM/177ML PO SOLN: 1.0000 | Freq: Once | ORAL | 0 refills | Status: AC

## 2021-11-25 NOTE — Progress Notes (Signed)

## 2021-12-16 ENCOUNTER — Telehealth: Payer: Self-pay | Admitting: Gastroenterology

## 2021-12-16 ENCOUNTER — Telehealth: Payer: Self-pay | Admitting: *Deleted

## 2021-12-16 ENCOUNTER — Encounter: Payer: Self-pay | Admitting: *Deleted

## 2021-12-16 MED ORDER — NA SULFATE-K SULFATE-MG SULF 17.5-3.13-1.6 GM/177ML PO SOLN: 1.0000 | Freq: Once | ORAL | 0 refills | Status: AC

## 2021-12-16 NOTE — Telephone Encounter (Signed)
Secibd  encounter opened in error

## 2021-12-16 NOTE — Telephone Encounter (Signed)
Inbound call from patient stating that he called the pharmacy and they stated that have not received a prescription for Suprep for patients procedure on 12/4 at 10:30. Patient is requesting prep be resent and to be sent to [facility] on [address]. Please advise.

## 2021-12-16 NOTE — Telephone Encounter (Signed)
Re-sent Suprep to [facility]

## 2021-12-19 ENCOUNTER — Encounter: Payer: Self-pay | Admitting: Gastroenterology

## 2021-12-23 ENCOUNTER — Ambulatory Visit (AMBULATORY_SURGERY_CENTER): Payer: No Typology Code available for payment source | Admitting: Gastroenterology

## 2021-12-23 ENCOUNTER — Encounter: Payer: Self-pay | Admitting: Gastroenterology

## 2021-12-23 VITALS — BP 147/80 | HR 91 | Temp 96.4°F | Resp 9

## 2021-12-23 DIAGNOSIS — Z1211 Encounter for screening for malignant neoplasm of colon: Secondary | ICD-10-CM | POA: Diagnosis present

## 2021-12-23 HISTORY — PX: COLONOSCOPY: SHX174

## 2021-12-23 MED ORDER — SODIUM CHLORIDE 0.9 % IV SOLN: 500.0000 mL | Freq: Once | INTRAVENOUS | Status: DC

## 2021-12-23 MED ORDER — DEXTROSE 50 % IV SOLN
25.0000 mL | Freq: Once | INTRAVENOUS | Status: AC
  Administered 2021-12-23: 25 mL via INTRAVENOUS

## 2021-12-23 MED ORDER — DEXTROSE 5 % IV SOLN: Freq: Once | INTRAVENOUS | Status: DC

## 2021-12-23 NOTE — Patient Instructions (Addendum)
- High fiber diet. - Continue present medications. -Repeat colonoscopy inn 10 years  YOU HAD AN ENDOSCOPIC PROCEDURE TODAY: Refer to the procedure report and other information in the discharge instructions given to you for any specific questions about what was found during the examination. If this information does not answer your questions, please call [place] office at [phone] to clarify.   YOU SHOULD EXPECT: Some feelings of bloating in the abdomen. Passage of more gas than usual. Walking can help get rid of the air that was put into your GI tract during the procedure and reduce the bloating. If you had a lower endoscopy (such as a colonoscopy or flexible sigmoidoscopy) you may notice spotting of blood in your stool or on the toilet paper. Some abdominal soreness may be present for a day or two, also.  DIET: Your first meal following the procedure should be a light meal and then it is ok to progress to your normal diet. A half-sandwich or bowl of soup is an example of a good first meal. Heavy or fried foods are harder to digest and may make you feel nauseous or bloated. Drink plenty of fluids but you should avoid alcoholic beverages for 24 hours. If you had a esophageal dilation, please see attached instructions for diet.    ACTIVITY: Your care partner should take you home directly after the procedure. You should plan to take it easy, moving slowly for the rest of the day. You can resume normal activity the day after the procedure however YOU SHOULD NOT DRIVE, use power tools, machinery or perform tasks that involve climbing or major physical exertion for 24 hours (because of the sedation medicines used during the test).   SYMPTOMS TO REPORT IMMEDIATELY: A gastroenterologist can be reached at any hour. Please call [phone]  for any of the following symptoms:  Following lower endoscopy (colonoscopy, flexible sigmoidoscopy) Excessive amounts of blood in the stool  Significant tenderness,  worsening of abdominal pains  Swelling of the abdomen that is new, acute  Fever of 100 or higher  Following upper endoscopy (EGD, EUS, ERCP, esophageal dilation) Vomiting of blood or coffee ground material  New, significant abdominal pain  New, significant chest pain or pain under the shoulder blades  Painful or persistently difficult swallowing  New shortness of breath  Black, tarry-looking or red, bloody stools  FOLLOW UP:  If any biopsies were taken you will be contacted by phone or by letter within the next 1-3 weeks. Call [phone]  if you have not heard about the biopsies in 3 weeks.  Please also call with any specific questions about appointments or follow up tests.YOU HAD AN ENDOSCOPIC PROCEDURE TODAY: Refer to the procedure report and other information in the discharge instructions given to you for any specific questions about what was found during the examination. If this information does not answer your questions, please call [place] office at [phone] to clarify.   YOU SHOULD EXPECT: Some feelings of bloating in the abdomen. Passage of more gas than usual. Walking can help get rid of the air that was put into your GI tract during the procedure and reduce the bloating. If you had a lower endoscopy (such as a colonoscopy or flexible sigmoidoscopy) you may notice spotting of blood in your stool or on the toilet paper. Some abdominal soreness may be present for a day or two, also.  DIET: Your first meal following the procedure should be a light meal and then it is ok to progress to  your normal diet. A half-sandwich or bowl of soup is an example of a good first meal. Heavy or fried foods are harder to digest and may make you feel nauseous or bloated. Drink plenty of fluids but you should avoid alcoholic beverages for 24 hours. If you had a esophageal dilation, please see attached instructions for diet.    ACTIVITY: Your care partner should take you home directly after the procedure.  You should plan to take it easy, moving slowly for the rest of the day. You can resume normal activity the day after the procedure however YOU SHOULD NOT DRIVE, use power tools, machinery or perform tasks that involve climbing or major physical exertion for 24 hours (because of the sedation medicines used during the test).   SYMPTOMS TO REPORT IMMEDIATELY: A gastroenterologist can be reached at any hour. Please call [phone]  for any of the following symptoms:  Following lower endoscopy (colonoscopy, flexible sigmoidoscopy) Excessive amounts of blood in the stool  Significant tenderness, worsening of abdominal pains  Swelling of the abdomen that is new, acute  Fever of 100 or higher  FOLLOW UP:  If any biopsies were taken you will be contacted by phone or by letter within the next 1-3 weeks. Call [phone]  if you have not heard about the biopsies in 3 weeks.  Please also call with any specific questions about appointments or follow up tests.

## 2021-12-23 NOTE — Op Note (Signed)
[facility] Patient Name: Gabriel Waters Procedure Date: 12/23/2021 10:23 AM MRN: [id number] Endoscopist: Meryl Dare , MD, [phone] Age: 46 Referring MD:  Date of Birth: [birth year] Gender: Male Account #: [id number] Procedure:                Colonoscopy Indications:              Screening for colorectal malignant neoplasm Medicines:                Monitored Anesthesia Care Procedure:                Pre-Anesthesia Assessment:                           - Prior to the procedure, a History and Physical                            was performed, and patient medications and                            allergies were reviewed. The patient's tolerance of                            previous anesthesia was also reviewed. The risks                            and benefits of the procedure and the sedation                            options and risks were discussed with the patient.                            All questions were answered, and informed consent                            was obtained. Prior Anticoagulants: The patient has                            taken no anticoagulant or antiplatelet agents. ASA                            Grade Assessment: III - A patient with severe                            systemic disease. After reviewing the risks and                            benefits, the patient was deemed in satisfactory                            condition to undergo the procedure.                           After obtaining informed consent, the colonoscope  was passed under direct vision. Throughout the                            procedure, the patient's blood pressure, pulse, and                            oxygen saturations were monitored continuously. The                            Olympus CF-HQ190L (Serial# [id number]) Colonoscope was                            introduced through the anus and advanced to the the                            cecum,  identified by appendiceal orifice and                            ileocecal valve. The ileocecal valve, appendiceal                            orifice, and rectum were photographed. The quality                            of the bowel preparation was good. The colonoscopy                            was performed without difficulty. The patient                            tolerated the procedure well. Scope In: 10:38:31 AM Scope Out: 10:53:10 AM Scope Withdrawal Time: 0 hours 11 minutes 24 seconds  Total Procedure Duration: 0 hours 14 minutes 39 seconds  Findings:                 The perianal and digital rectal examinations were                            normal.                           Scattered medium-mouthed and small-mouthed                            diverticula were found in the descending colon,                            transverse colon and ascending colon.                            Peri-diverticular erythema was seen. There was                            evidence of an impacted diverticulum. There was no  evidence of diverticular bleeding.                           Internal hemorrhoids were found during                            retroflexion. The hemorrhoids were small and Grade                            I (internal hemorrhoids that do not prolapse).                           The exam was otherwise without abnormality on                            direct and retroflexion views. Complications:            No immediate complications. Estimated blood loss:                            None. Estimated Blood Loss:     Estimated blood loss: none. Impression:               - Mild diverticulosis in the descending colon, in                            the transverse colon and in the ascending colon.                           - Internal hemorrhoids.                           - The examination was otherwise normal on direct                            and  retroflexion views.                           - No specimens collected. Recommendation:           - Repeat colonoscopy in 10 years for screening                            purposes.                           - Patient has a contact number available for                            emergencies. The signs and symptoms of potential                            delayed complications were discussed with the                            patient. Return to normal activities tomorrow.  Written discharge instructions were provided to the                            patient.                           - High fiber diet.                           - Continue present medications. Meryl DareMalcolm T Pierra Skora, MD 12/23/2021 10:57:16 AM This report has been signed electronically.

## 2021-12-23 NOTE — Progress Notes (Signed)
Pt's states no medical or surgical changes since previsit or office visit. 

## 2021-12-23 NOTE — Progress Notes (Signed)
CBG 52 D50 70ml IV given at 1021.  E.Johnson, CRNA made aware. CBG will be recheck in procedure room.

## 2021-12-23 NOTE — Progress Notes (Signed)
Pt resting comfortably. VSS. Airway intact. SBAR complete to RN. All questions answered.   

## 2021-12-23 NOTE — Progress Notes (Signed)
Blood glucose 131 - D5 IV solution stopped.

## 2021-12-23 NOTE — Progress Notes (Signed)
History & Physical  Primary Care Physician:  [name], NP Primary Gastroenterologist: Claudette Head, MD  CHIEF COMPLAINT:  CRC screening  HPI: Gabriel Waters is a 46 y.o. male who is average risk CRC screening for colonoscopy   Past Medical History:  Diagnosis Date   Acute pancreatitis 09/13/2013   Allergy    SEASONAL   Anxiety    Arthritis    RIGHT SHOULDER   Depression    DIABETES MELLITUS, TYPE II, UNCONTROLLED 01/02/2010   ELEVATED BLOOD PRESSURE 12/31/2009   GERD (gastroesophageal reflux disease)    Hyperlipidemia    Hypertension    Neuropathy    FEET BILATERAL   Pancreatitis 09/13/2013   Sleep apnea     Past Surgical History:  Procedure Laterality Date   CHOLECYSTECTOMY, LAPAROSCOPIC  03/15/2019   WISDOM TOOTH EXTRACTION      Prior to Admission medications   Medication Sig Start Date End Date Taking? Authorizing Provider  amLODipine (NORVASC) 5 MG tablet Take 5 mg by mouth daily.   Yes [provider]  insulin aspart (NOVOLOG FLEXPEN) 100 UNIT/ML FlexPen 8 units with meals if blood sugar is over 170 Subcutaneous three times per day as needed 10/14/21  Yes [provider]  Lancet Devices (SIMPLE DIAGNOSTICS LANCING DEV) MISC 1 each by Misc.(Non-Drug; Combo Route) route 2 times daily. 02/09/20  Yes [provider]  losartan (COZAAR) 50 MG tablet Take 1 tablet (50 mg total) by mouth daily. 02/03/18  Yes Burchette, Elberta Fortis, MD  rosuvastatin (CRESTOR) 20 MG tablet Take 1 tablet (20 mg total) by mouth daily. 02/03/18  Yes Burchette, Elberta Fortis, MD  TRESIBA FLEXTOUCH 100 UNIT/ML FlexTouch Pen 35 units Subcutaneous once daily for 30 days   Yes [provider]  Accu-Chek Softclix Lancets lancets Check blood sugar three times a day. 30 day supply. No refills Patient not taking: Reported on 11/25/2021 09/30/21   Charise Killian, MD  acetaminophen (TYLENOL) 500 MG tablet Take 500-1,000 mg by mouth every 6 (six) hours as needed for mild  pain or fever.    [provider]  aspirin 81 MG EC tablet Take by mouth. Patient not taking: Reported on 11/25/2021 04/14/19   [provider]  Continuous Blood Gluc Sensor (FREESTYLE LIBRE 3 SENSOR) MISC Place 1 sensor on the skin every 14 days. Use to check glucose continuously. 30 day supply. No refills 09/30/21   Charise Killian, MD  Continuous Blood Gluc Sensor (FREESTYLE LIBRE 3 SENSOR) MISC See admin instructions.    [provider]  fluticasone [facility] ALLERGY RELIEF) 50 MCG/ACT nasal spray 1 spray in each nostril Nasally Once a day 02/13/20   [provider]  insulin aspart (NOVOLOG FLEXPEN) 100 UNIT/ML FlexPen Inject 8 Units into the skin 3 (three) times daily with meals. 09/30/21 10/30/21  Charise Killian, MD  Insulin Pen Needle (PEN NEEDLES 3/16") 31G X 5 MM MISC Dispense enough pen needles for tresiba 35 units daily, novolog 8 units TID w/ meals x 30 days. No refills. 09/30/21   Charise Killian, MD  metFORMIN (GLUCOPHAGE) 1000 MG tablet Take 1,000 mg by mouth 2 (two) times daily.    [provider]  Multiple Vitamins-Minerals (ALIVE MULTI-VITAMIN PO) Take by mouth.    [provider]    Current Outpatient Medications  Medication Sig Dispense Refill   amLODipine (NORVASC) 5 MG tablet Take 5 mg by mouth daily.     insulin aspart (NOVOLOG FLEXPEN) 100 UNIT/ML FlexPen 8 units  with meals if blood sugar is over 170 Subcutaneous three times per day as needed     Lancet Devices (SIMPLE DIAGNOSTICS LANCING DEV) MISC 1 each by Misc.(Non-Drug; Combo Route) route 2 times daily.     losartan (COZAAR) 50 MG tablet Take 1 tablet (50 mg total) by mouth daily. 90 tablet 1   rosuvastatin (CRESTOR) 20 MG tablet Take 1 tablet (20 mg total) by mouth daily. 90 tablet 1   TRESIBA FLEXTOUCH 100 UNIT/ML FlexTouch Pen 35 units Subcutaneous once daily for 30 days     Accu-Chek Softclix Lancets lancets Check blood sugar three times a day. 30 day  supply. No refills (Patient not taking: Reported on 11/25/2021) 100 each 0   acetaminophen (TYLENOL) 500 MG tablet Take 500-1,000 mg by mouth every 6 (six) hours as needed for mild pain or fever.     aspirin 81 MG EC tablet Take by mouth. (Patient not taking: Reported on 11/25/2021)     Continuous Blood Gluc Sensor (FREESTYLE LIBRE 3 SENSOR) MISC Place 1 sensor on the skin every 14 days. Use to check glucose continuously. 30 day supply. No refills 2 each 0   Continuous Blood Gluc Sensor (FREESTYLE LIBRE 3 SENSOR) MISC See admin instructions.     fluticasone (FLONASE ALLERGY RELIEF) 50 MCG/ACT nasal spray 1 spray in each nostril Nasally Once a day     insulin aspart (NOVOLOG FLEXPEN) 100 UNIT/ML FlexPen Inject 8 Units into the skin 3 (three) times daily with meals. 3 mL 0   Insulin Pen Needle (PEN NEEDLES 3/16") 31G X 5 MM MISC Dispense enough pen needles for tresiba 35 units daily, novolog 8 units TID w/ meals x 30 days. No refills. 120 each 0   metFORMIN (GLUCOPHAGE) 1000 MG tablet Take 1,000 mg by mouth 2 (two) times daily.     Multiple Vitamins-Minerals (ALIVE MULTI-VITAMIN PO) Take by mouth.     Current Facility-Administered Medications  Medication Dose Route Frequency Provider Last Rate Last Admin   0.9 %  sodium chloride infusion  500 mL Intravenous Once Meryl Dare, MD        Allergies as of 12/23/2021 - Review Complete 12/23/2021  Allergen Reaction Noted   Exenatide Other (See Comments) 11/23/2013   Invokana [canagliflozin] Other (See Comments) 08/23/2014   Lipitor [atorvastatin] Other (See Comments) 01/04/2013   Lisinopril Other (See Comments) 11/23/2013    Family History  Problem Relation Age of Onset   Colon polyps Mother    Heart disease Mother    Allergies Mother    Lung cancer Mother    Diabetes Father    Heart disease Father    Emphysema Father    Sarcoidosis Father    Colon polyps Sister    Colon cancer Neg Hx    Crohn's disease Neg Hx    Esophageal cancer Neg  Hx    Rectal cancer Neg Hx    Stomach cancer Neg Hx    Ulcerative colitis Neg Hx     Social History   Socioeconomic History   Marital status: Married    Spouse name: Not on file   Number of children: Not on file   Years of education: Not on file   Highest education level: Not on file  Occupational History   Occupation: Product/process development scientist: [employer]  Tobacco Use   Smoking status: Never    Passive exposure: Past   Smokeless tobacco: Never  Vaping Use   Vaping Use: Never used  Substance and Sexual Activity   Alcohol use: Yes    Comment: social--2-3 glasses wine occassionally   Drug use: No   Sexual activity: Not on file  Other Topics Concern   Not on file  Social History Narrative   Not on file   Social Determinants of Health   Financial Resource Strain: Not on file  Food Insecurity: No Food Insecurity (09/29/2021)   Hunger Vital Sign    Worried About Running Out of Food in the Last Year: Never true    Ran Out of Food in the Last Year: Never true  Transportation Needs: No Transportation Needs (09/29/2021)   PRAPARE - Administrator, Civil Service (Medical): No    Lack of Transportation (Non-Medical): No  Physical Activity: Not on file  Stress: Not on file  Social Connections: Not on file  Intimate Partner Violence: Not At Risk (09/29/2021)   Humiliation, Afraid, Rape, and Kick questionnaire    Fear of Current or Ex-Partner: No    Emotionally Abused: No    Physically Abused: No    Sexually Abused: No    Review of Systems:  All systems reviewed were negative except where noted in HPI.   Physical Exam: General:  Alert, well-developed, in NAD Head:  Normocephalic and atraumatic. Eyes:  Sclera clear, no icterus.   Conjunctiva pink. Ears:  Normal auditory acuity. Mouth:  No deformity or lesions.  Neck:  Supple; no masses . Lungs:  Clear throughout to auscultation.   No wheezes, crackles, or rhonchi. No acute distress. Heart:  Regular  rate and rhythm; no murmurs. Abdomen:  Soft, nondistended, nontender. No masses, hepatomegaly. No obvious masses.  Normal bowel .    Rectal:  Deferred   Msk:  Symmetrical without gross deformities.. Pulses:  Normal pulses noted. Extremities:  Without edema. Neurologic:  Alert and  oriented x4;  grossly normal neurologically. Skin:  Intact without significant lesions or rashes. Psych:  Alert and cooperative. Normal mood and affect.  Impression / Plan:   Average risk CRC screening for colonoscopy.  Venita Lick. Russella Dar  12/23/2021, 10:24 AM See Loretha Stapler, [facility], to contact our on call provider

## 2021-12-24 ENCOUNTER — Telehealth: Payer: Self-pay

## 2021-12-24 NOTE — Telephone Encounter (Signed)
Attempted f/u call. No answer, left VM. 

## 2022-01-10 ENCOUNTER — Encounter: Payer: Self-pay | Admitting: Neurology

## 2022-01-24 ENCOUNTER — Ambulatory Visit: Payer: No Typology Code available for payment source | Attending: Internal Medicine | Admitting: Internal Medicine

## 2022-01-24 ENCOUNTER — Telehealth: Payer: Self-pay | Admitting: *Deleted

## 2022-01-24 ENCOUNTER — Encounter: Payer: Self-pay | Admitting: Internal Medicine

## 2022-01-24 VITALS — BP 148/86 | HR 97 | Ht 74.0 in | Wt 312.6 lb

## 2022-01-24 DIAGNOSIS — I10 Essential (primary) hypertension: Secondary | ICD-10-CM | POA: Diagnosis not present

## 2022-01-24 DIAGNOSIS — R0683 Snoring: Secondary | ICD-10-CM

## 2022-01-24 DIAGNOSIS — E785 Hyperlipidemia, unspecified: Secondary | ICD-10-CM | POA: Diagnosis not present

## 2022-01-24 DIAGNOSIS — G4733 Obstructive sleep apnea (adult) (pediatric): Secondary | ICD-10-CM

## 2022-01-24 NOTE — Telephone Encounter (Signed)
Pt was seen in the office today by Dr. Harrington Challenger and was ordered an Itamar sleep study. Pt agreeable to signed waiver and to not open the box until he has been called with the PIN #.

## 2022-01-24 NOTE — Progress Notes (Signed)
Cardiology Office Note   Date:  01/24/2022   ID:  Gabriel Waters, DOB 28-Aug-1975, MRN [id number]  PCP:  Berkley Harvey, NP  Cardiologist:   Dorris Carnes, MD   Patient referred for evaluation of HTN     History of Present Illness: Gabriel Waters is a 47 y.o. male with a history ofType II DM (Dx 10 years ago), HTN, obesity   Followed by Sammuel Bailiff    Referred for HTN  The pt says his BP hs not been optimal    Usually 140s/ 80s to 90s     He denies CP  Breathing is OK   Pt hsa Hx T2DM , diagnosed 10 years ago     IN Sept the pt was admitted in a hyperosmolar, hyperglycemic state  A1C 13.2     Admits to eating fast food.     Since then he has made marked changes in his diet   On metformin and SS insulin   A1C is now  in 6s    Curently takes Takes glucophage and insulin     Diet: Br  Skips or Cereal ore eggs  [place] sausage Lunch   Salad or grilled checken Dinner   Depends  Same   Drinks  Water   Occaional diet soda  Current Meds  Medication Sig   amLODipine (NORVASC) 5 MG tablet Take 5 mg by mouth daily.   aspirin 81 MG EC tablet Take by mouth.   Continuous Blood Gluc Sensor (FREESTYLE LIBRE 3 SENSOR) MISC Place 1 sensor on the skin every 14 days. Use to check glucose continuously. 30 day supply. No refills   fluticasone (FLONASE ALLERGY RELIEF) 50 MCG/ACT nasal spray 1 spray daily as needed.   insulin aspart (NOVOLOG FLEXPEN) 100 UNIT/ML FlexPen 8 units with meals if blood sugar is over 170 Subcutaneous three times per day as needed   Insulin Pen Needle (PEN NEEDLES 3/16") 31G X 5 MM MISC Dispense enough pen needles for tresiba 35 units daily, novolog 8 units TID w/ meals x 30 days. No refills.   Lancet Devices (SIMPLE DIAGNOSTICS LANCING DEV) MISC 1 each by Misc.(Non-Drug; Combo Route) route 2 times daily.   losartan (COZAAR) 50 MG tablet Take 1 tablet (50 mg total) by mouth daily.   metFORMIN (GLUCOPHAGE) 1000 MG tablet Take 1,000 mg by mouth 2 (two) times daily.   Multiple  Vitamins-Minerals (ALIVE MULTI-VITAMIN PO) Take by mouth.   rosuvastatin (CRESTOR) 20 MG tablet Take 1 tablet (20 mg total) by mouth daily.   TRESIBA FLEXTOUCH 100 UNIT/ML FlexTouch Pen 35 units Subcutaneous once daily for 30 days   Current Facility-Administered Medications for the 01/24/22 encounter (Office Visit) with Fay Records, MD  Medication   dextrose 5 % solution     Allergies:   Exenatide, Invokana [canagliflozin], Lipitor [atorvastatin], and Lisinopril   Past Medical History:  Diagnosis Date   Acute pancreatitis 09/13/2013   Allergy    SEASONAL   Anxiety    Arthritis    RIGHT SHOULDER   Depression    DIABETES MELLITUS, TYPE II, UNCONTROLLED 01/02/2010   ELEVATED BLOOD PRESSURE 12/31/2009   GERD (gastroesophageal reflux disease)    Hyperlipidemia    Hypertension    Neuropathy    FEET BILATERAL   Pancreatitis 09/13/2013   Sleep apnea     Past Surgical History:  Procedure Laterality Date   CHOLECYSTECTOMY, LAPAROSCOPIC  03/15/2019   WISDOM TOOTH EXTRACTION       Social History:  The patient  reports that he has never smoked. He has been exposed to tobacco smoke. He has never used smokeless tobacco. He reports current alcohol use. He reports that he does not use drugs.   Family History:  The patient's family history includes Allergies in his mother; Colon polyps in his mother and sister; Diabetes in his father; Emphysema in his father; Heart disease in his father and mother; Lung cancer in his mother; Sarcoidosis in his father.    ROS:  Please see the history of present illness. All other systems are reviewed and  Negative to the above problem except as noted.    PHYSICAL EXAM: VS:  BP (!) 148/86   Pulse 97   Ht 6\' 2"  (1.88 m)   Wt (!) 312 lb 9.6 oz (141.8 kg)   SpO2 98%   BMI 40.14 kg/m   GEN: Well nourished, well developed, in no acute distress  HEENT: normal  Neck: no JVD, no carotid bruits Cardiac: RRR;  No murmurs  No LE  edema  Respiratory:   clear to auscultation bilaterally GI: soft, nontender, nondistended, + BS  No hepatomegaly  MS: no deformity Moving all extremities   Skin: warm and dry, no rash Neuro:  Strength and sensation are intact Psych: euthymic mood, full affect   EKG:  EKG is not  ordered today.   Lipid Panel    Component Value Date/Time   CHOL 136 05/10/2018 0841   TRIG 79.0 05/10/2018 0841   HDL 37.70 (L) 05/10/2018 0841   CHOLHDL 4 05/10/2018 0841   VLDL 15.8 05/10/2018 0841   LDLCALC 83 05/10/2018 0841   LDLDIRECT 129.0 03/31/2017 1542      Wt Readings from Last 3 Encounters:  01/24/22 (!) 312 lb 9.6 oz (141.8 kg)  11/25/21 (!) 301 lb (136.5 kg)  09/28/21 264 lb 5.3 oz (119.9 kg)      ASSESSMENT AND PLAN:  1  HTN  Hx of HTN that has not been optimally controlled    Some may be due to sleep apnea   Will sched a home sleep evaluatoin Will get labs today before  recomm medical Rx for BP      2  DM   Congratulated patient on the marked changes in diet that he has made with such great results     3  HL  Will check lipids  today    Pateint was told in past that he had an enlarged heart   Will get echo        Current medicines are reviewed at length with the patient today.  The patient does not have concerns regarding medicines.  Signed, Dorris Carnes, MD  01/24/2022 9:28 AM    [address], [place], [place]  [address] Phone: [phone]; Fax: [phone]

## 2022-01-24 NOTE — Patient Instructions (Addendum)
Medication Instructions:   *If you need a refill on your cardiac medications before your next appointment, please call your pharmacy*   Lab Work: CMET, NMR, APO B, LIPO A If you have labs (blood work) drawn today and your tests are completely normal, you will receive your results only by: [place] (if you have MyChart) OR A paper copy in the mail If you have any lab test that is abnormal or we need to change your treatment, we will call you to review the results.   Testing/Procedures: Your physician has requested that you have an echocardiogram. Echocardiography is a painless test that uses sound waves to create images of your heart. It provides your doctor with information about the size and shape of your heart and how well your heart's chambers and valves are working. This procedure takes approximately one hour. There are no restrictions for this procedure. Please do NOT wear cologne, perfume, aftershave, or lotions (deodorant is allowed). Please arrive 15 minutes prior to your appointment time.  ITAMAR SLEEP STUDY     Follow-Up: At [facility], you and your health needs are our priority.  As part of our continuing mission to provide you with exceptional heart care, we have created designated Provider Care Teams.  These Care Teams include your primary Cardiologist (physician) and Advanced Practice Providers (APPs -  Physician Assistants and Nurse Practitioners) who all work together to provide you with the care you need, when you need it.  We recommend signing up for the patient portal called "MyChart".  Sign up information is provided on this After Visit Summary.  MyChart is used to connect with patients for Virtual Visits (Telemedicine).  Patients are able to view lab/test results, encounter notes, upcoming appointments, etc.  Non-urgent messages can be sent to your provider as well.   To learn more about what you can do with MyChart, go to [email or web address].      Important Information About Sugar

## 2022-01-24 NOTE — Progress Notes (Signed)
Patient Name:         DOB:       Height:     Weight:  Office Name:         Referring Provider:  Today's Date:  Date:   STOP BANG RISK ASSESSMENT S (snore) Have you been told that you snore?    YES   T (tired) Are you often tired, fatigued, or sleepy during the day?   YES  O (obstruction) Do you stop breathing, choke, or gasp during sleep?  YES   P (pressure) Do you have or are you being treated for high blood pressure?  YES   B (BMI) Is your body index greater than 35 kg/m?  YES   A (age) Are you 15 years old or older?  NO   N (neck) Do you have a neck circumference greater than 16 inches?   YES   G (gender) Are you a male?  YES   TOTAL STOP/BANG "YES" ANSWERS                                                                        For Office Use Only              Procedure Order Form    Score 7     YES to 3+ Stop Bang questions OR two clinical symptoms - patient qualifies for WatchPAT (CPT 95800)             Clinical Notes: Will consult Sleep Specialist and refer for management of therapy due to patient increased risk of Sleep Apnea. Ordering a sleep study due to the following two clinical symptoms: Excessive daytime sleepiness G47.10 / Gastroesophageal reflux K21.9 / Nocturia R35.1 / Morning Headaches G44.221 / Difficulty concentrating R41.840 / Memory problems or poor judgment G31.84 / Personality changes or irritability R45.4 / Loud snoring R06.83 / Depression F32.9 / Unrefreshed by sleep G47.8 / Impotence N52.9 / History of high blood pressure R03.0 / Insomnia G47.00    I understand that I am proceeding with a home sleep apnea test as ordered by my treating physician. I understand that untreated sleep apnea is a serious cardiovascular risk factor and it is my responsibility to perform the test and seek management for sleep apnea. I will be contacted with the results and be managed for sleep apnea by a local sleep physician. I will be receiving equipment and further  instructions from [facility]. I shall promptly ship back the equipment via the included mailing label. I understand my insurance will be billed for the test and as the patient I am responsible for any insurance related out-of-pocket costs incurred. I have been provided with written instructions and can call for additional video or telephonic instruction, with 24-hour availability of qualified personnel to answer any questions: Patient Help Desk [phone].  Patient Signature ______________________________________________________   Date______________________ Patient Telemedicine Verbal Consent

## 2022-01-26 LAB — COMPREHENSIVE METABOLIC PANEL
ALT: 17 IU/L (ref 0–44)
AST: 22 IU/L (ref 0–40)
Albumin/Globulin Ratio: 1.6 (ref 1.2–2.2)
Albumin: 4.1 g/dL (ref 4.1–5.1)
Alkaline Phosphatase: 116 IU/L (ref 44–121)
BUN/Creatinine Ratio: 24 — ABNORMAL HIGH (ref 9–20)
BUN: 25 mg/dL — ABNORMAL HIGH (ref 6–24)
Bilirubin Total: 1.3 mg/dL — ABNORMAL HIGH (ref 0.0–1.2)
CO2: 24 mmol/L (ref 20–29)
Calcium: 9.1 mg/dL (ref 8.7–10.2)
Chloride: 105 mmol/L (ref 96–106)
Creatinine, Ser: 1.03 mg/dL (ref 0.76–1.27)
Globulin, Total: 2.6 g/dL (ref 1.5–4.5)
Glucose: 148 mg/dL — ABNORMAL HIGH (ref 70–99)
Potassium: 4.7 mmol/L (ref 3.5–5.2)
Sodium: 141 mmol/L (ref 134–144)
Total Protein: 6.7 g/dL (ref 6.0–8.5)
eGFR: 90 mL/min/{1.73_m2} (ref >59)

## 2022-01-26 LAB — NMR, LIPOPROFILE
Cholesterol, Total: 116 mg/dL (ref 100–199)
HDL Particle Number: 31.3 umol/L (ref >=30.5)
HDL-C: 42 mg/dL (ref >39)
LDL Particle Number: 670 nmol/L (ref <1000)
LDL Size: 21.6 nm (ref >20.5)
LDL-C (NIH Calc): 57 mg/dL (ref 0–99)
LP-IR Score: <25 (ref <=45)
Small LDL Particle Number: 162 nmol/L (ref <=527)
Triglycerides: 87 mg/dL (ref 0–149)

## 2022-01-26 LAB — APOLIPOPROTEIN B: Apolipoprotein B: 60 mg/dL (ref <90)

## 2022-01-26 LAB — LIPOPROTEIN A (LPA): Lipoprotein (a): 338.4 nmol/L — ABNORMAL HIGH (ref <75.0)

## 2022-01-28 ENCOUNTER — Other Ambulatory Visit: Payer: Self-pay

## 2022-01-28 ENCOUNTER — Telehealth: Payer: Self-pay | Admitting: Internal Medicine

## 2022-01-28 ENCOUNTER — Encounter (INDEPENDENT_AMBULATORY_CARE_PROVIDER_SITE_OTHER): Payer: BLUE CROSS/BLUE SHIELD | Admitting: Cardiology

## 2022-01-28 DIAGNOSIS — G4733 Obstructive sleep apnea (adult) (pediatric): Secondary | ICD-10-CM

## 2022-01-28 MED ORDER — LOSARTAN POTASSIUM 100 MG PO TABS: 100.0000 mg | ORAL_TABLET | Freq: Every day | ORAL | 3 refills | Status: DC

## 2022-01-28 NOTE — Telephone Encounter (Signed)
Pt has been made aware he can proceed with itamar study. Pt will do  study one night this week. Pt has been given PIN # [id number].   Called and made the patient aware that he may proceed with the [facility] Sleep Study. PIN # provided to the patient. Patient made aware that he will be contacted after the test has been read with the results and any recommendations. Patient verbalized understanding and thanked me for the call.

## 2022-01-28 NOTE — Telephone Encounter (Signed)
Prior Authorization for [facility] sent to [facility] via web portal. Tracking Number .  READY- APPROVED- Order ID: [id number] Valid Through:01/28/2022 - 03/28/2022

## 2022-01-28 NOTE — Telephone Encounter (Signed)
Patient is returning RN's call for lab results.  

## 2022-01-29 ENCOUNTER — Encounter: Payer: Self-pay | Admitting: Internal Medicine

## 2022-01-29 ENCOUNTER — Ambulatory Visit: Payer: BLUE CROSS/BLUE SHIELD | Attending: Internal Medicine

## 2022-01-29 DIAGNOSIS — R0683 Snoring: Secondary | ICD-10-CM

## 2022-01-29 NOTE — Telephone Encounter (Signed)
Spoke with the Gabriel Waters and clarifying with Dr Harrington Challenger if changing the Amlodipine dose also.

## 2022-01-29 NOTE — Procedures (Signed)
SLEEP STUDY REPORT Patient Information Study Date: 01/28/2022 Patient Name: Gabriel Waters Patient ID: [id number] Birth Date: [birth year] Age: 47 Gender: Male BMI: 40.2 (W=313 lb, H=6' 2'') Stopbang: 7 Referring Physician: Dorris Carnes, MD  TEST DESCRIPTION:  Home sleep apnea testing was completed using the WatchPat, a Type 1 device, utilizing peripheral arterial tonometry (PAT), chest movement, actigraphy, pulse oximetry, pulse rate, body position and snore.  AHI was calculated with apnea and hypopnea using valid sleep time as the denominator. RDI includes apneas, hypopneas, and RERAs.  The data acquired and the scoring of sleep and all associated events were performed in accordance with the recommended standards and specifications as outlined in the AASM Manual for the Scoring of Sleep and Associated Events 2.2.0 (2015).  FINDINGS:  1.  Moderate Obstructive Sleep Apnea with AHI 22.8/hr.   2.  No Central Sleep Apnea with pAHIc 0.9/hr.  3.  Oxygen desaturations as low as 71%.  4.  Severe snoring was present. O2 sats were < 88% for 51.9 min.  5.  Total sleep time was 6 hrs and 51 min.  6.  17.9% of total sleep time was spent in REM sleep.   7.  Normal sleep onset latency at 16 min  8.  Shortened REM sleep onset latency at 66 min.   9.  Total awakenings were 16.  10. Arrhythmia detection:  None  DIAGNOSIS:   Moderate Obstructive Sleep Apnea (G47.33) Nocturnal Hypoxemia  RECOMMENDATIONS:   1.  Clinical correlation of these findings is necessary.  The decision to treat obstructive sleep apnea (OSA) is usually based on the presence of apnea symptoms or the presence of associated medical conditions such as Hypertension, Congestive Heart Failure, Atrial Fibrillation or Obesity.  The most common symptoms of OSA are snoring, gasping for breath while sleeping, daytime sleepiness and fatigue.   2.  Initiating apnea therapy is recommended given the presence of symptoms and/or  associated conditions. Recommend proceeding with one of the following:     a.  Auto-CPAP therapy with a pressure range of 5-20cm H2O.     b.  An oral appliance (OA) that can be obtained from certain dentists with expertise in sleep medicine.  These are primarily of use in non-obese patients with mild and moderate disease.     c.  An ENT consultation which may be useful to look for specific causes of obstruction and possible treatment options.     d.  If patient is intolerant to PAP therapy, consider referral to ENT for evaluation for hypoglossal nerve stimulator.   3.  Close follow-up is necessary to ensure success with CPAP or oral appliance therapy for maximum benefit.  4.  A follow-up oximetry study on CPAP is recommended to assess the adequacy of therapy and determine the need for supplemental oxygen or the potential need for Bi-level therapy.  An arterial blood gas to determine the adequacy of baseline ventilation and oxygenation should also be considered.  5.  Healthy sleep recommendations include:  adequate nightly sleep (normal 7-9 hrs/night), avoidance of caffeine after noon and alcohol near bedtime, and maintaining a sleep environment that is cool, dark and quiet.  6.  Weight loss for overweight patients is recommended.  Even modest amounts of weight loss can significantly improve the severity of sleep apnea.  7.  Snoring recommendations include:  weight loss where appropriate, side sleeping, and avoidance of alcohol before bed.  8.  Operation of motor vehicle should not be performed when  sleepy.  Signature:   Fransico Him, MD; [facility]; [place], [place] Board of Sleep Medicine Electronically Signed: 01/29/2022

## 2022-01-29 NOTE — Telephone Encounter (Signed)
Left another message for the pt and sent to his My Chart.  (Losartan sent to his Pharmacy and need to double check his Amlodipine dose.. is he already on 5mg ?)  Per Dr Harrington Challenger: Lipids are excellent Lpa is high   That is inherited    I would work on controlling other risk factores With BP being high I owuld recomm increasing both losartan and amlodipine to 100 and 5 mg respectively   Keep track of BP in a few wks  Goal 110s , 120s  Occasional 130s

## 2022-01-30 NOTE — Telephone Encounter (Signed)
Sorry.  Increase amlodipine to 10 mg as well

## 2022-01-31 DIAGNOSIS — E113513 Type 2 diabetes mellitus with proliferative diabetic retinopathy with macular edema, bilateral: Secondary | ICD-10-CM | POA: Diagnosis not present

## 2022-02-04 MED ORDER — LOSARTAN POTASSIUM 100 MG PO TABS: 100.0000 mg | ORAL_TABLET | Freq: Every day | ORAL | 3 refills | Status: DC

## 2022-02-04 MED ORDER — AMLODIPINE BESYLATE 10 MG PO TABS: 10.0000 mg | ORAL_TABLET | Freq: Every day | ORAL | 3 refills | Status: DC

## 2022-02-04 NOTE — Telephone Encounter (Signed)
Lm for the pt per his DPR and sent him a My Chart message.

## 2022-02-06 ENCOUNTER — Encounter: Payer: Self-pay | Admitting: Internal Medicine

## 2022-02-11 ENCOUNTER — Encounter: Payer: Self-pay | Admitting: Internal Medicine

## 2022-02-11 DIAGNOSIS — I10 Essential (primary) hypertension: Secondary | ICD-10-CM | POA: Diagnosis not present

## 2022-02-11 DIAGNOSIS — E113213 Type 2 diabetes mellitus with mild nonproliferative diabetic retinopathy with macular edema, bilateral: Secondary | ICD-10-CM | POA: Diagnosis not present

## 2022-02-11 DIAGNOSIS — E1165 Type 2 diabetes mellitus with hyperglycemia: Secondary | ICD-10-CM | POA: Diagnosis not present

## 2022-02-11 DIAGNOSIS — E782 Mixed hyperlipidemia: Secondary | ICD-10-CM | POA: Diagnosis not present

## 2022-02-11 MED ORDER — TRIAMTERENE-HCTZ 37.5-25 MG PO TABS: 1.0000 | ORAL_TABLET | Freq: Every day | ORAL | 3 refills | Status: DC

## 2022-02-11 NOTE — Telephone Encounter (Signed)
I would recomm adding maxzide 37.5/25 mg daily to regimen    Check BMET in 2 wks

## 2022-02-11 NOTE — Telephone Encounter (Signed)
Pt is taking the Losartan 100 mg daily and Amlodipine 10 mg a day.

## 2022-02-11 NOTE — Telephone Encounter (Signed)
Please confirm the doses he is taking now     Also-  Looks like had [place] sleep study   Waiting on results

## 2022-02-12 DIAGNOSIS — I10 Essential (primary) hypertension: Secondary | ICD-10-CM | POA: Diagnosis not present

## 2022-02-12 DIAGNOSIS — E1165 Type 2 diabetes mellitus with hyperglycemia: Secondary | ICD-10-CM | POA: Diagnosis not present

## 2022-02-12 DIAGNOSIS — E782 Mixed hyperlipidemia: Secondary | ICD-10-CM | POA: Diagnosis not present

## 2022-02-13 ENCOUNTER — Ambulatory Visit (HOSPITAL_COMMUNITY): Payer: BLUE CROSS/BLUE SHIELD | Attending: Cardiology

## 2022-02-13 DIAGNOSIS — I10 Essential (primary) hypertension: Secondary | ICD-10-CM | POA: Insufficient documentation

## 2022-02-13 DIAGNOSIS — R0683 Snoring: Secondary | ICD-10-CM

## 2022-02-13 DIAGNOSIS — E785 Hyperlipidemia, unspecified: Secondary | ICD-10-CM | POA: Diagnosis not present

## 2022-02-13 DIAGNOSIS — G4733 Obstructive sleep apnea (adult) (pediatric): Secondary | ICD-10-CM | POA: Insufficient documentation

## 2022-02-13 DIAGNOSIS — R008 Other abnormalities of heart beat: Secondary | ICD-10-CM | POA: Diagnosis not present

## 2022-02-13 LAB — ECHOCARDIOGRAM COMPLETE
Area-P 1/2: 4.7 cm2
S' Lateral: 2.8 cm

## 2022-02-13 MED ORDER — PERFLUTREN LIPID MICROSPHERE
1.0000 mL | INTRAVENOUS | Status: AC | PRN
  Administered 2022-02-13: 2 mL via INTRAVENOUS

## 2022-02-19 ENCOUNTER — Ambulatory Visit: Payer: BLUE CROSS/BLUE SHIELD | Attending: Internal Medicine

## 2022-02-19 DIAGNOSIS — I10 Essential (primary) hypertension: Secondary | ICD-10-CM

## 2022-02-20 ENCOUNTER — Telehealth: Payer: Self-pay

## 2022-02-20 LAB — BASIC METABOLIC PANEL
BUN/Creatinine Ratio: 21 — ABNORMAL HIGH (ref 9–20)
BUN: 26 mg/dL — ABNORMAL HIGH (ref 6–24)
CO2: 24 mmol/L (ref 20–29)
Calcium: 8.9 mg/dL (ref 8.7–10.2)
Chloride: 105 mmol/L (ref 96–106)
Creatinine, Ser: 1.24 mg/dL (ref 0.76–1.27)
Glucose: 103 mg/dL — ABNORMAL HIGH (ref 70–99)
Potassium: 4.3 mmol/L (ref 3.5–5.2)
Sodium: 142 mmol/L (ref 134–144)
eGFR: 72 mL/min/{1.73_m2} (ref >59)

## 2022-02-20 NOTE — Telephone Encounter (Signed)
Advised the pt his lab results and he reports that he has been having increased pitting edema in his lower extremities... his BP has been very high... average since he did not have the readings with him 170-190/80-100..... 2 days ago his BP was 214/109.... readings have been random and not premeds.   He has had a headache but no dizziness or SOB.   Will forward to Dr Harrington Challenger to review.

## 2022-02-20 NOTE — Telephone Encounter (Signed)
I would recomm add hydralazine 25 mg tid to regimen Keep following BP

## 2022-02-21 MED ORDER — HYDRALAZINE HCL 25 MG PO TABS: 25.0000 mg | ORAL_TABLET | Freq: Three times a day (TID) | ORAL | 3 refills | Status: DC

## 2022-02-21 NOTE — Telephone Encounter (Signed)
Attempted phone call to pt.  Per Epic ok to leave detailed voicemail message.  Pt advised per Dr Harrington Challenger Hydralazine 25mg  - 1 tablet by mouth three times daily sent to pharmacy on file and advised to continue monitoring BP.  Call [phone] for any further questions or concerns.

## 2022-02-22 ENCOUNTER — Encounter: Payer: Self-pay | Admitting: Internal Medicine

## 2022-02-24 ENCOUNTER — Telehealth: Payer: Self-pay | Admitting: *Deleted

## 2022-02-24 DIAGNOSIS — I10 Essential (primary) hypertension: Secondary | ICD-10-CM

## 2022-02-24 DIAGNOSIS — R0683 Snoring: Secondary | ICD-10-CM

## 2022-02-24 DIAGNOSIS — G4733 Obstructive sleep apnea (adult) (pediatric): Secondary | ICD-10-CM

## 2022-02-24 NOTE — Telephone Encounter (Signed)
The patient has been notified of the result. Left detailed message on voicemail and informed patient to call back..[name], CMA   

## 2022-02-24 NOTE — Telephone Encounter (Signed)
-----   Message from Lauralee Evener, [place] sent at 01/30/2022  3:27 PM EST -----  ----- Message ----- From: Sueanne Margarita, MD Sent: 01/29/2022   2:08 PM EST To: [facility] Sleep Studies  Please let patient know that they have sleep apnea.  Recommend therapeutic CPAP titration for treatment of patient's sleep disordered breathing.  If unable to perform an in lab titration then initiate ResMed auto CPAP from 4 to 15cm H2O with heated humidity and mask of choice and overnight pulse ox on CPAP.

## 2022-02-25 ENCOUNTER — Ambulatory Visit: Payer: BLUE CROSS/BLUE SHIELD | Admitting: Neurology

## 2022-02-25 ENCOUNTER — Encounter: Payer: Self-pay | Admitting: Neurology

## 2022-02-25 VITALS — BP 172/111 | HR 95 | Ht 74.0 in | Wt 323.0 lb

## 2022-02-25 DIAGNOSIS — R202 Paresthesia of skin: Secondary | ICD-10-CM

## 2022-02-25 NOTE — Patient Instructions (Addendum)
Start physical therapy  Check your feet daily  Take extra care on uneven ground  Return to clinic in 6 months

## 2022-02-25 NOTE — Progress Notes (Signed)
[facility] Clinic Note - Initial Visit   Date: 02/25/2022   Gabriel Waters: [id number] DOB: 05/19/1975   Gabriel Gabriel Abrahams, NP:  Thank you for your kind referral of Gabriel Waters for consultation of neuropathy. Although his history is well known to you, please allow [place] to reiterate it for the purpose of our medical record. The patient was accompanied to the clinic by Gabriel Waters.   Gabriel Waters is a 47 y.o. right-handed male with insulin-dependent diabetes mellitus (HbA1c 6.1), hypertension, and hyperlipidemia presenting for evaluation of neuropathy.   IMPRESSION/PLAN: Diabetic neuropathy affecting the lower legs and feet manifesting with numbness, longstanding.  Exam shows gradient pattern of sensory loss, distal weakness in the feet, and sensory ataxia.   I had extensive discussion with the patient regarding the pathogenesis, etiology, management, and natural course of neuropathy. Neuropathy tends to be slowly progressive, especially if a underlying etiology is not optimally managed.  His diabetes was poorly controlled until very recently when he established care with endocrinology.  His most recent HbA1c is 6.1, compared to 13 previously.   Management is symptomatic.   Unfortunately, there is no treatment for numbness.  He denies pain, so no role for neuralgesic medication. For gait ataxia, start physical therapy Patient educated on daily foot inspection, fall prevention, and safety precautions around the home.   2.  Right > left tinnitus.  I recommend that he follow-up with ENT  3.  During the visit today, his glucometer noted a low glucose level and he was lightheaded.  Patient was given gingerale and chocolate after which symptoms improved.  He will continue to monitor   Return to clinic in 6 months  -----------------------------------------------------------  History of present illness: He has been diabetic for ~ the past 10 years.  He began having  tingling in the feet 5-6 years ago, which was intermittent at first and gradually became more constant.  Over the past 2-3 years, he has developed intense numbness in his feet and occasional tingling.  Numbness extends into the calf bilaterally.  On one occasion, he noticed his toe was bleeding and the nail came off, but did not know.  Another time, he was walking on the cap of a water bottle and only realized this after hearing the noise on the tile.  He has imbalance, some weakness in the legs.  He occasionally stumbles, no falls.   No low back pain or radicular leg pain.  His diabetes has been poorly controlled for a long time, however, he recently established care with endocrinology and had adjustment in insulin.  His most recent HbA1c was 6.1.  He also complains of ringing in the ears, mostly in the right ear.  It is episodic and lasts a few hours.  No ear pain.    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 No children.  He socially drinks alcohol.     Out-side paper records, electronic medical record, and images have been reviewed where available and summarized as:  Lab Results  Component Value Date   HGBA1C 13.2 (H) 09/29/2021   No results found for: "VITAMINB12" Lab Results  Component Value Date   TSH 2.29 05/10/2018   No results found for: "ESRSEDRATE", "POCTSEDRATE"  Past Medical History:  Diagnosis Date   Acute pancreatitis 09/13/2013   Allergy    SEASONAL   Anxiety    Arthritis    RIGHT SHOULDER   Depression  DIABETES MELLITUS, TYPE II, UNCONTROLLED 01/02/2010   ELEVATED BLOOD PRESSURE 12/31/2009   GERD (gastroesophageal reflux disease)    Hyperlipidemia    Hypertension    Neuropathy    FEET BILATERAL   Pancreatitis 09/13/2013   Sleep apnea     Past Surgical History:  Procedure Laterality Date   CHOLECYSTECTOMY, LAPAROSCOPIC  03/15/2019   WISDOM TOOTH EXTRACTION       Medications:  Outpatient  Encounter Medications as of 02/25/2022  Medication Sig   Accu-Chek Softclix Lancets lancets Check blood sugar three times a day. 30 day supply. No refills   amLODipine (NORVASC) 10 MG tablet Take 1 tablet (10 mg total) by mouth daily.   aspirin 81 MG EC tablet Take by mouth.   Continuous Blood Gluc Sensor (FREESTYLE LIBRE 3 SENSOR) MISC Place 1 sensor on the skin every 14 days. Use to check glucose continuously. 30 day supply. No refills   fluticasone (FLONASE ALLERGY RELIEF) 50 MCG/ACT nasal spray 1 spray daily as needed.   hydrALAZINE (APRESOLINE) 25 MG tablet Take 1 tablet (25 mg total) by mouth 3 (three) times daily.   insulin aspart (NOVOLOG FLEXPEN) 100 UNIT/ML FlexPen 8 units with meals if blood sugar is over 170 Subcutaneous three times per day as needed   Insulin Pen Needle (PEN NEEDLES 3/16") 31G X 5 MM MISC Dispense enough pen needles for tresiba 35 units daily, novolog 8 units TID w/ meals x 30 days. No refills.   Lancet Devices (SIMPLE DIAGNOSTICS LANCING DEV) MISC 1 each by Misc.(Non-Drug; Combo Route) route 2 times daily.   losartan (COZAAR) 100 MG tablet Take 1 tablet (100 mg total) by mouth daily.   metFORMIN (GLUCOPHAGE) 1000 MG tablet Take 1,000 mg by mouth 2 (two) times daily.   Multiple Vitamins-Minerals (ALIVE MULTI-VITAMIN PO) Take by mouth.   rosuvastatin (CRESTOR) 20 MG tablet Take 1 tablet (20 mg total) by mouth daily.   TRESIBA FLEXTOUCH 100 UNIT/ML FlexTouch Pen 32 units daily   triamterene-hydrochlorothiazide (MAXZIDE-25) 37.5-25 MG tablet Take 1 tablet by mouth daily.   Facility-Administered Encounter Medications as of 02/25/2022  Medication   dextrose 5 % solution    Allergies:  Allergies  Allergen Reactions   Exenatide Other (See Comments)    pancreatitis   Invokana [Canagliflozin] Other (See Comments)    Yeast balanitis   Lipitor [Atorvastatin] Other (See Comments)    Arthralgias.   Lisinopril Other (See Comments)    pancreatitis    Family  History: Family History  Problem Relation Age of Onset   Colon polyps Gabriel Waters    Heart disease Gabriel Waters    Allergies Gabriel Waters    Diabetes Father    Heart disease Father    Emphysema Father    Sarcoidosis Father    Colon polyps Sister    Colon cancer Neg Hx    Crohn's disease Neg Hx    Esophageal cancer Neg Hx    Rectal cancer Neg Hx    Stomach cancer Neg Hx    Ulcerative colitis Neg Hx     Social History: Social History   Tobacco Use   Smoking status: Never    Passive exposure: Past   Smokeless tobacco: Never  Vaping Use   Vaping Use: Never used  Substance Use Topics   Alcohol use: Yes    Comment: social--2-3 glasses wine occassionally   Drug use: No   Social History   Social History Narrative   Right Handed.    Lives in a one story home. Lives  with Gabriel Waters.        Vital Signs:  BP (!) 172/111   Pulse 95   Ht 6\' 2"  (1.88 m)   Wt (!) 323 lb (146.5 kg)   SpO2 99%   BMI 41.47 kg/m    Neurological Exam: MENTAL STATUS including orientation to time, place, person, recent and remote memory, attention span and concentration, language, and fund of knowledge is normal.  Speech is not dysarthric.  CRANIAL NERVES: II:  No visual field defects.    III-IV-VI: Pupils equal round and reactive to light.  Normal conjugate, extra-ocular eye movements in all directions of gaze.  No nystagmus.  No ptosis.   V:  Normal facial sensation.    VII:  Normal facial symmetry and movements.   VIII:  Normal hearing and vestibular function.   IX-X:  Normal palatal movement.   XI:  Normal shoulder shrug and head rotation.   XII:  Normal tongue strength and range of motion, no deviation or fasciculation.  MOTOR:  No atrophy, fasciculations or abnormal movements.  No pronator drift.   Upper Extremity:  Right  Left  Deltoid  5/5   5/5   Biceps  5/5   5/5   Triceps  5/5   5/5   Wrist extensors  5/5   5/5   Wrist flexors  5/5   5/5   Finger extensors  5/5   5/5   Finger flexors  5/5    5/5   Dorsal interossei  5-/5   5-/5   Abductor pollicis  5/5   5/5   Tone (Ashworth scale)  0  0   Lower Extremity:  Right  Left  Hip flexors  5/5   5/5   Knee flexors  5/5   5/5   Knee extensors  5/5   5/5   Dorsiflexors  5/5   5/5   Plantarflexors  5/5   5/5   Toe extensors  4/5   4/5   Toe flexors  4/5   4/5   Tone (Ashworth scale)  0  0   MSRs:                                           Right        Left brachioradialis 2+  2+  biceps 2+  2+  triceps 2+  2+  patellar 1+  1+  ankle jerk 0  0  Hoffman no  no  plantar response down  down   SENSORY:  Gradient pattern of sensory loss to all modalities from the mid-calf down, absent sensation below the ankles bilaterally.  Rhomberg sign is present.   COORDINATION/GAIT: Normal finger-to- nose-finger.  Intact rapid alternating movements bilaterally.  Gait is mildly wide-based, stable, and unassisted.  He can stand on toes, unable to stand on heels.  Unsteady with tandem gait.     Thank you for allowing me to participate in patient's care.  If I can answer any additional questions, I would be pleased to do so.    Sincerely,    Hiyab Nhem K. Posey Pronto, DO

## 2022-02-26 NOTE — Telephone Encounter (Signed)
The patient has been notified of the result and verbalized understanding.  All questions (if any) were answered. Marolyn Hammock, CMA 02/26/2022 7:79 PM    Will precert the titration

## 2022-02-26 NOTE — Addendum Note (Signed)
Addended by: Freada Bergeron on: 02/26/2022 03:48 PM   Modules accepted: Orders

## 2022-02-28 DIAGNOSIS — M79671 Pain in right foot: Secondary | ICD-10-CM | POA: Diagnosis not present

## 2022-02-28 DIAGNOSIS — M25572 Pain in left ankle and joints of left foot: Secondary | ICD-10-CM | POA: Diagnosis not present

## 2022-02-28 DIAGNOSIS — E1140 Type 2 diabetes mellitus with diabetic neuropathy, unspecified: Secondary | ICD-10-CM | POA: Diagnosis not present

## 2022-02-28 DIAGNOSIS — M14671 Charcot's joint, right ankle and foot: Secondary | ICD-10-CM | POA: Diagnosis not present

## 2022-03-04 DIAGNOSIS — R2689 Other abnormalities of gait and mobility: Secondary | ICD-10-CM | POA: Diagnosis not present

## 2022-03-04 DIAGNOSIS — M6281 Muscle weakness (generalized): Secondary | ICD-10-CM | POA: Diagnosis not present

## 2022-03-10 DIAGNOSIS — M6281 Muscle weakness (generalized): Secondary | ICD-10-CM | POA: Diagnosis not present

## 2022-03-10 DIAGNOSIS — R2689 Other abnormalities of gait and mobility: Secondary | ICD-10-CM | POA: Diagnosis not present

## 2022-03-12 DIAGNOSIS — R2689 Other abnormalities of gait and mobility: Secondary | ICD-10-CM | POA: Diagnosis not present

## 2022-03-12 DIAGNOSIS — M6281 Muscle weakness (generalized): Secondary | ICD-10-CM | POA: Diagnosis not present

## 2022-03-17 DIAGNOSIS — H35372 Puckering of macula, left eye: Secondary | ICD-10-CM | POA: Diagnosis not present

## 2022-03-17 DIAGNOSIS — H4312 Vitreous hemorrhage, left eye: Secondary | ICD-10-CM | POA: Diagnosis not present

## 2022-03-17 DIAGNOSIS — E113513 Type 2 diabetes mellitus with proliferative diabetic retinopathy with macular edema, bilateral: Secondary | ICD-10-CM | POA: Diagnosis not present

## 2022-03-17 DIAGNOSIS — H35033 Hypertensive retinopathy, bilateral: Secondary | ICD-10-CM | POA: Diagnosis not present

## 2022-03-17 DIAGNOSIS — H43823 Vitreomacular adhesion, bilateral: Secondary | ICD-10-CM | POA: Diagnosis not present

## 2022-03-19 DIAGNOSIS — M6281 Muscle weakness (generalized): Secondary | ICD-10-CM | POA: Diagnosis not present

## 2022-03-19 DIAGNOSIS — R2689 Other abnormalities of gait and mobility: Secondary | ICD-10-CM | POA: Diagnosis not present

## 2022-03-20 DIAGNOSIS — E1165 Type 2 diabetes mellitus with hyperglycemia: Secondary | ICD-10-CM | POA: Diagnosis not present

## 2022-03-20 DIAGNOSIS — G4733 Obstructive sleep apnea (adult) (pediatric): Secondary | ICD-10-CM | POA: Diagnosis not present

## 2022-03-20 DIAGNOSIS — I10 Essential (primary) hypertension: Secondary | ICD-10-CM | POA: Diagnosis not present

## 2022-03-24 DIAGNOSIS — R7989 Other specified abnormal findings of blood chemistry: Secondary | ICD-10-CM | POA: Diagnosis not present

## 2022-03-25 DIAGNOSIS — M6281 Muscle weakness (generalized): Secondary | ICD-10-CM | POA: Diagnosis not present

## 2022-03-25 DIAGNOSIS — R2689 Other abnormalities of gait and mobility: Secondary | ICD-10-CM | POA: Diagnosis not present

## 2022-03-25 NOTE — Telephone Encounter (Signed)
DME selection is [facility] Patient understands he will be contacted by [facility] to set up his cpap. Patient understands to call if [facility] does not contact him with new setup in a timely manner. Patient understands they will be called once confirmation has been received from [facility] that they have received their new machine to schedule 10 week follow up appointment. [facility] notified of new cpap order  Please add to airview Patient was grateful for the call and thanked me

## 2022-03-25 NOTE — Telephone Encounter (Signed)
Per Dr Radford Pax: If unable to perform an in lab titration then initiate ResMed auto CPAP from 4 to 15cm H2O with heated humidity and mask of choice and overnight pulse ox on CPAP.

## 2022-03-25 NOTE — Telephone Encounter (Signed)
Prior Authorization for titration sent to [facility] via web portal. Tracking Number . SUGGEST: Switch to APAP Treatment: Order ID: [id number]          Approval Valid Through:03/25/2022 - 06/22/2022

## 2022-03-25 NOTE — Addendum Note (Signed)
Addended by: Freada Bergeron on: 03/25/2022 03:06 PM   Modules accepted: Orders

## 2022-03-26 DIAGNOSIS — R2689 Other abnormalities of gait and mobility: Secondary | ICD-10-CM | POA: Diagnosis not present

## 2022-03-26 DIAGNOSIS — M6281 Muscle weakness (generalized): Secondary | ICD-10-CM | POA: Diagnosis not present

## 2022-03-31 DIAGNOSIS — R2689 Other abnormalities of gait and mobility: Secondary | ICD-10-CM | POA: Diagnosis not present

## 2022-03-31 DIAGNOSIS — M6281 Muscle weakness (generalized): Secondary | ICD-10-CM | POA: Diagnosis not present

## 2022-04-02 DIAGNOSIS — M6281 Muscle weakness (generalized): Secondary | ICD-10-CM | POA: Diagnosis not present

## 2022-04-02 DIAGNOSIS — R2689 Other abnormalities of gait and mobility: Secondary | ICD-10-CM | POA: Diagnosis not present

## 2022-04-07 DIAGNOSIS — M6281 Muscle weakness (generalized): Secondary | ICD-10-CM | POA: Diagnosis not present

## 2022-04-07 DIAGNOSIS — R2689 Other abnormalities of gait and mobility: Secondary | ICD-10-CM | POA: Diagnosis not present

## 2022-04-09 DIAGNOSIS — R2689 Other abnormalities of gait and mobility: Secondary | ICD-10-CM | POA: Diagnosis not present

## 2022-04-09 DIAGNOSIS — M6281 Muscle weakness (generalized): Secondary | ICD-10-CM | POA: Diagnosis not present

## 2022-04-14 DIAGNOSIS — R2689 Other abnormalities of gait and mobility: Secondary | ICD-10-CM | POA: Diagnosis not present

## 2022-04-14 DIAGNOSIS — M6281 Muscle weakness (generalized): Secondary | ICD-10-CM | POA: Diagnosis not present

## 2022-05-08 DIAGNOSIS — Z125 Encounter for screening for malignant neoplasm of prostate: Secondary | ICD-10-CM | POA: Diagnosis not present

## 2022-05-08 DIAGNOSIS — Z13220 Encounter for screening for lipoid disorders: Secondary | ICD-10-CM | POA: Diagnosis not present

## 2022-05-08 DIAGNOSIS — Z0000 Encounter for general adult medical examination without abnormal findings: Secondary | ICD-10-CM | POA: Diagnosis not present

## 2022-05-14 DIAGNOSIS — I10 Essential (primary) hypertension: Secondary | ICD-10-CM | POA: Diagnosis not present

## 2022-05-14 DIAGNOSIS — E1165 Type 2 diabetes mellitus with hyperglycemia: Secondary | ICD-10-CM | POA: Diagnosis not present

## 2022-05-14 DIAGNOSIS — E782 Mixed hyperlipidemia: Secondary | ICD-10-CM | POA: Diagnosis not present

## 2022-05-16 DIAGNOSIS — E113413 Type 2 diabetes mellitus with severe nonproliferative diabetic retinopathy with macular edema, bilateral: Secondary | ICD-10-CM | POA: Diagnosis not present

## 2022-05-20 ENCOUNTER — Encounter: Payer: Self-pay | Admitting: Internal Medicine

## 2022-05-20 DIAGNOSIS — E782 Mixed hyperlipidemia: Secondary | ICD-10-CM | POA: Diagnosis not present

## 2022-05-20 DIAGNOSIS — E1165 Type 2 diabetes mellitus with hyperglycemia: Secondary | ICD-10-CM | POA: Diagnosis not present

## 2022-05-20 DIAGNOSIS — I10 Essential (primary) hypertension: Secondary | ICD-10-CM | POA: Diagnosis not present

## 2022-05-20 NOTE — Telephone Encounter (Signed)
Increase hydralazine to 50 tid    Follow up in  6 wks

## 2022-05-20 NOTE — Telephone Encounter (Signed)
Looks like Gabriel Waters reached out to patient ealier this year to go over labs   Could not reach How is his BP running now?

## 2022-05-21 ENCOUNTER — Telehealth: Payer: Self-pay

## 2022-05-21 DIAGNOSIS — I10 Essential (primary) hypertension: Secondary | ICD-10-CM

## 2022-05-21 MED ORDER — HYDRALAZINE HCL 50 MG PO TABS
50.0000 mg | ORAL_TABLET | Freq: Three times a day (TID) | ORAL | 3 refills | Status: DC
Start: 2022-05-21 — End: 2022-08-20

## 2022-05-21 NOTE — Telephone Encounter (Signed)
Called patient to discuss Dr. Charlott Rakes advice to increase hydralazine to 50 mg TID. Asked patient to call back to let us know preferred pharmacy and to schedule 6 week follow up via voice mail on DPR number.

## 2022-05-21 NOTE — Addendum Note (Signed)
Addended by: Luellen Pucker on: 05/21/2022 04:43 PM   Modules accepted: Orders

## 2022-05-21 NOTE — Telephone Encounter (Signed)
Spoke with patient, confirmed pharmacy for hydralazine order. Advised patient to keep BP log and scheduled 6 week f/u for 07/16/22 w/ Dr. Tenny Craw.

## 2022-05-26 ENCOUNTER — Emergency Department: Payer: BLUE CROSS/BLUE SHIELD

## 2022-05-26 DIAGNOSIS — E1140 Type 2 diabetes mellitus with diabetic neuropathy, unspecified: Secondary | ICD-10-CM | POA: Diagnosis not present

## 2022-05-26 DIAGNOSIS — R519 Headache, unspecified: Secondary | ICD-10-CM | POA: Insufficient documentation

## 2022-05-26 DIAGNOSIS — R0600 Dyspnea, unspecified: Secondary | ICD-10-CM | POA: Diagnosis not present

## 2022-05-26 DIAGNOSIS — I10 Essential (primary) hypertension: Secondary | ICD-10-CM | POA: Insufficient documentation

## 2022-05-26 DIAGNOSIS — R0602 Shortness of breath: Secondary | ICD-10-CM | POA: Diagnosis not present

## 2022-05-26 LAB — COMPREHENSIVE METABOLIC PANEL
ALT: 18 U/L (ref 0–44)
AST: 32 U/L (ref 15–41)
Albumin: 3.7 g/dL (ref 3.5–5.0)
Alkaline Phosphatase: 88 U/L (ref 38–126)
Anion gap: 10 (ref 5–15)
BUN: 29 mg/dL — ABNORMAL HIGH (ref 6–20)
CO2: 21 mmol/L — ABNORMAL LOW (ref 22–32)
Calcium: 8.5 mg/dL — ABNORMAL LOW (ref 8.9–10.3)
Chloride: 107 mmol/L (ref 98–111)
Creatinine, Ser: 1.29 mg/dL — ABNORMAL HIGH (ref 0.61–1.24)
GFR, Estimated: >60 mL/min (ref >60)
Glucose, Bld: 106 mg/dL — ABNORMAL HIGH (ref 70–99)
Potassium: 3.6 mmol/L (ref 3.5–5.1)
Sodium: 138 mmol/L (ref 135–145)
Total Bilirubin: 1.8 mg/dL — ABNORMAL HIGH (ref 0.3–1.2)
Total Protein: 7.2 g/dL (ref 6.5–8.1)

## 2022-05-26 LAB — CBC WITH DIFFERENTIAL/PLATELET
Abs Immature Granulocytes: 0.03 10*3/uL (ref 0.00–0.07)
Basophils Absolute: 0.1 10*3/uL (ref 0.0–0.1)
Basophils Relative: 1 %
Eosinophils Absolute: 0.1 10*3/uL (ref 0.0–0.5)
Eosinophils Relative: 1 %
HCT: 30.9 % — ABNORMAL LOW (ref 39.0–52.0)
Hemoglobin: 9.8 g/dL — ABNORMAL LOW (ref 13.0–17.0)
Immature Granulocytes: 0 %
Lymphocytes Relative: 16 %
Lymphs Abs: 1.5 10*3/uL (ref 0.7–4.0)
MCH: 25.5 pg — ABNORMAL LOW (ref 26.0–34.0)
MCHC: 31.7 g/dL (ref 30.0–36.0)
MCV: 80.3 fL (ref 80.0–100.0)
Monocytes Absolute: 0.5 10*3/uL (ref 0.1–1.0)
Monocytes Relative: 5 %
Neutro Abs: 7 10*3/uL (ref 1.7–7.7)
Neutrophils Relative %: 77 %
Platelets: 306 10*3/uL (ref 150–400)
RBC: 3.85 MIL/uL — ABNORMAL LOW (ref 4.22–5.81)
RDW: 15.5 % (ref 11.5–15.5)
WBC: 9.1 10*3/uL (ref 4.0–10.5)
nRBC: 0 % (ref 0.0–0.2)

## 2022-05-26 LAB — CBG MONITORING, ED: Glucose-Capillary: 104 mg/dL — ABNORMAL HIGH (ref 70–99)

## 2022-05-26 LAB — TROPONIN I (HIGH SENSITIVITY): Troponin I (High Sensitivity): 14 ng/L (ref <18)

## 2022-05-26 NOTE — ED Notes (Addendum)
CBG 104 in triage. 

## 2022-05-26 NOTE — ED Triage Notes (Signed)
Pt to triage via wheelchair with c/o shortness of breath. Sx onset while pt at work, normal exertion. Pt family member states pt has been having anxiety and has been having attacks more frequently recently.  Pt also having difficulty sleeping and c/o leg tingling.  Denies chest pain

## 2022-05-27 ENCOUNTER — Emergency Department
Admission: EM | Admit: 2022-05-27 | Discharge: 2022-05-27 | Disposition: A | Discharge status: Home / Self Care | Payer: BLUE CROSS/BLUE SHIELD | Attending: Emergency Medicine | Admitting: Emergency Medicine

## 2022-05-27 DIAGNOSIS — R519 Headache, unspecified: Secondary | ICD-10-CM

## 2022-05-27 DIAGNOSIS — R0602 Shortness of breath: Secondary | ICD-10-CM

## 2022-05-27 LAB — TROPONIN I (HIGH SENSITIVITY): Troponin I (High Sensitivity): 14 ng/L (ref <18)

## 2022-05-27 MED ORDER — ACETAMINOPHEN 500 MG PO TABS
1000.0000 mg | ORAL_TABLET | Freq: Once | ORAL | Status: AC
  Administered 2022-05-27: 1000 mg via ORAL
  Filled 2022-05-27: qty 2

## 2022-05-27 MED ORDER — KETOROLAC TROMETHAMINE 30 MG/ML IJ SOLN
15.0000 mg | Freq: Once | INTRAMUSCULAR | Status: AC
  Administered 2022-05-27: 15 mg via INTRAVENOUS
  Filled 2022-05-27: qty 1

## 2022-05-27 NOTE — ED Provider Notes (Signed)
[facility] Provider Note    Event Date/Time   First MD Initiated Contact with Patient 05/27/22 0041     (approximate)   History   Shortness of Breath   HPI  Gabriel Waters is a 47 y.o. male who presents to the ED for evaluation of Shortness of Breath   I reviewed PCP visit from 2/29.  Obese patient with history of HTN, DM, neuropathy and HLD.  Patient presents to the ED for evaluation of dizziness, headache and dyspnea.  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  He was at his baseline, kneeling down at a lower shelf, and reports feeling dizzy upon standing.  Reports feeling flushed and short of breath upon standing alongside dizziness.  No chest pain, falls or syncope.  His manager told him to take the rest of the day off.  After he got home, he reports sitting and having recurrence of dyspnea for few minutes, reports the left side of his face began aching and he reports anxiety.  Here in the ED, by the time I see him, he waits for a few hours and he reports feeling better.  His breathing is "normal" and denies any chest discomfort, only reporting left-sided headache that is persistent.  Patient is at baseline and denies any dizziness.  Physical Exam   Triage Vital Signs: ED Triage Vitals  Enc Vitals Group     BP 05/26/22 2216 (!) 194/93     Pulse Rate 05/26/22 2216 93     Resp 05/26/22 2216 20     Temp 05/26/22 2216 98.2 F (36.8 C)     Temp Source 05/26/22 2216 Oral     SpO2 05/26/22 2216 100 %     Weight 05/26/22 2214 (!) 305 lb (138.3 kg)     Height 05/26/22 2214 6\' 2"  (1.88 m)     Head Circumference --      Peak Flow --      Pain Score 05/26/22 2214 0     Pain Loc --      Pain Edu? --      Excl. in GC? --     Most recent vital signs: Vitals:   05/27/22 0300 05/27/22 0435  BP: (!) 185/86 (!) 168/81  Pulse: 90 81  Resp: 13 16  Temp:  98.2 F (36.8 C)  SpO2: 97% 99%     General: Awake, no distress.  Comfortably asleep and supine.  Awakens easily and reports feeling well CV:  Good peripheral perfusion.  Resp:  Normal effort. CTAB Abd:  No distention.  MSK:  No deformity noted.  Neuro:  No focal deficits appreciated. Cranial nerves II through XII intact 5/5 strength and sensation in all 4 extremities Other:     ED Results / Procedures / Treatments   Labs (all labs ordered are listed, but only abnormal results are displayed) Labs Reviewed  CBC WITH DIFFERENTIAL/PLATELET - Abnormal; Notable for the following components:      Result Value   RBC 3.85 (*)    Hemoglobin 9.8 (*)    HCT 30.9 (*)    MCH 25.5 (*)    All other components within normal limits  COMPREHENSIVE METABOLIC PANEL - Abnormal; Notable for the following components:   CO2 21 (*)    Glucose, Bld 106 (*)    BUN 29 (*)    Creatinine, Ser 1.29 (*)    Calcium 8.5 (*)  Total Bilirubin 1.8 (*)    All other components within normal limits  CBG MONITORING, ED - Abnormal; Notable for the following components:   Glucose-Capillary 104 (*)    All other components within normal limits  TROPONIN I (HIGH SENSITIVITY)  TROPONIN I (HIGH SENSITIVITY)    EKG Sinus rhythm with a rate of 95 bpm.  Normal axis.  Incomplete left bundle.  No STEMI.  RADIOLOGY CXR interpreted by me without evidence of acute cardiopulmonary pathology.  Official radiology report(s): DG Chest Portable 1 View  Result Date: 05/26/2022 CLINICAL DATA:  Dyspnea EXAM: PORTABLE CHEST 1 VIEW COMPARISON:  09/28/2021 FINDINGS: The heart size and mediastinal contours are within normal limits. Both lungs are clear. The visualized skeletal structures are unremarkable. IMPRESSION: No active disease. Electronically Signed   By: Helyn Numbers M.D.   On: 05/26/2022 22:42    PROCEDURES and INTERVENTIONS:  .1-3 Lead EKG Interpretation  Performed by: Delton Prairie, MD Authorized by: Delton Prairie, MD     Interpretation: normal      ECG rate:  88   ECG rate assessment: normal     Rhythm: sinus rhythm     Ectopy: none     Conduction: normal     Medications  acetaminophen (TYLENOL) tablet 1,000 mg (1,000 mg Oral Given 05/27/22 0326)  ketorolac (TORADOL) 30 MG/ML injection 15 mg (15 mg Intravenous Given 05/27/22 0326)     IMPRESSION / MDM / ASSESSMENT AND PLAN / ED COURSE  I reviewed the triage vital signs and the nursing notes.  Differential diagnosis includes, but is not limited to, PE, ACS, anxiety, vasovagal episode, stroke  {Patient presents with symptoms of an acute illness or injury that is potentially life-threatening.  47 year old male presents to the ED with the above constellation of symptoms.  Resolving by the time I see him.  He is reassuring examination without evidence of neurologic deficits.  Has a nonischemic EKG and 2 negative troponins.  CKD at baseline.  CBC with normocytic anemia slightly lower than previous comparisons but he denies any bleeding symptoms and I doubt symptomatic anemia contributing to his symptoms.  No tachycardia, hypoxia.  Hypertension is noted.    Clinical Course as of 05/27/22 0752  Tue May 27, 2022  0450 Reassessed.  Feeling much better and reports resolution of symptoms and he is appreciative.  We discussed reassuring workup and possible etiologies of his symptoms.  We discussed close return precautions. [DS]    Clinical Course User Index [DS] Delton Prairie, MD     FINAL CLINICAL IMPRESSION(S) / ED DIAGNOSES   Final diagnoses:  SOB (shortness of breath)  Bad headache     Rx / DC Orders   ED Discharge Orders     None        Note:  This document was prepared using Dragon voice recognition software and may include unintentional dictation errors.   Delton Prairie, MD 05/27/22 [phone]

## 2022-05-27 NOTE — Discharge Instructions (Signed)
Please take Tylenol and ibuprofen/Advil for your pain.  It is safe to take them together, or to alternate them every few hours.  Take up to 1000mg of Tylenol at a time, up to 4 times per day.  Do not take more than 4000 mg of Tylenol in 24 hours.  For ibuprofen, take 400-600 mg, 3 - 4 times per day.  

## 2022-05-28 DIAGNOSIS — E1140 Type 2 diabetes mellitus with diabetic neuropathy, unspecified: Secondary | ICD-10-CM | POA: Diagnosis not present

## 2022-05-28 DIAGNOSIS — M79671 Pain in right foot: Secondary | ICD-10-CM | POA: Diagnosis not present

## 2022-05-28 DIAGNOSIS — M14671 Charcot's joint, right ankle and foot: Secondary | ICD-10-CM | POA: Diagnosis not present

## 2022-07-15 NOTE — Progress Notes (Unsigned)
   Patient is no show for appt   Signed, Dietrich Pates, MD  07/15/2022 4:01 PM    [facility] [address] [place], [place], [place]  [address] Phone: [phone]; Fax: [phone]

## 2022-07-16 ENCOUNTER — Ambulatory Visit: Payer: BLUE CROSS/BLUE SHIELD | Attending: Internal Medicine | Admitting: Internal Medicine

## 2022-07-16 ENCOUNTER — Encounter: Payer: Self-pay | Admitting: Internal Medicine

## 2022-07-28 DIAGNOSIS — H4312 Vitreous hemorrhage, left eye: Secondary | ICD-10-CM | POA: Diagnosis not present

## 2022-07-28 DIAGNOSIS — E113513 Type 2 diabetes mellitus with proliferative diabetic retinopathy with macular edema, bilateral: Secondary | ICD-10-CM | POA: Diagnosis not present

## 2022-07-28 DIAGNOSIS — H35372 Puckering of macula, left eye: Secondary | ICD-10-CM | POA: Diagnosis not present

## 2022-07-28 DIAGNOSIS — H43812 Vitreous degeneration, left eye: Secondary | ICD-10-CM | POA: Diagnosis not present

## 2022-07-28 DIAGNOSIS — H35033 Hypertensive retinopathy, bilateral: Secondary | ICD-10-CM | POA: Diagnosis not present

## 2022-08-01 ENCOUNTER — Other Ambulatory Visit: Payer: Self-pay

## 2022-08-01 ENCOUNTER — Emergency Department (HOSPITAL_COMMUNITY): Payer: BLUE CROSS/BLUE SHIELD

## 2022-08-01 ENCOUNTER — Emergency Department (HOSPITAL_COMMUNITY)
Admission: EM | Admit: 2022-08-01 | Discharge: 2022-08-01 | Disposition: A | Discharge status: Home / Self Care | Payer: BLUE CROSS/BLUE SHIELD | Attending: Emergency Medicine | Admitting: Emergency Medicine

## 2022-08-01 DIAGNOSIS — R601 Generalized edema: Secondary | ICD-10-CM | POA: Insufficient documentation

## 2022-08-01 DIAGNOSIS — N433 Hydrocele, unspecified: Secondary | ICD-10-CM | POA: Insufficient documentation

## 2022-08-01 DIAGNOSIS — Z79899 Other long term (current) drug therapy: Secondary | ICD-10-CM | POA: Insufficient documentation

## 2022-08-01 DIAGNOSIS — I10 Essential (primary) hypertension: Secondary | ICD-10-CM | POA: Insufficient documentation

## 2022-08-01 DIAGNOSIS — N5089 Other specified disorders of the male genital organs: Secondary | ICD-10-CM | POA: Diagnosis not present

## 2022-08-01 DIAGNOSIS — Z7982 Long term (current) use of aspirin: Secondary | ICD-10-CM | POA: Insufficient documentation

## 2022-08-01 DIAGNOSIS — R635 Abnormal weight gain: Secondary | ICD-10-CM | POA: Diagnosis not present

## 2022-08-01 DIAGNOSIS — I517 Cardiomegaly: Secondary | ICD-10-CM | POA: Diagnosis not present

## 2022-08-01 DIAGNOSIS — R1909 Other intra-abdominal and pelvic swelling, mass and lump: Secondary | ICD-10-CM | POA: Diagnosis not present

## 2022-08-01 DIAGNOSIS — N50819 Testicular pain, unspecified: Secondary | ICD-10-CM | POA: Diagnosis not present

## 2022-08-01 DIAGNOSIS — R609 Edema, unspecified: Secondary | ICD-10-CM | POA: Diagnosis not present

## 2022-08-01 DIAGNOSIS — R0600 Dyspnea, unspecified: Secondary | ICD-10-CM | POA: Diagnosis not present

## 2022-08-01 DIAGNOSIS — R0601 Orthopnea: Secondary | ICD-10-CM | POA: Diagnosis not present

## 2022-08-01 LAB — CBG MONITORING, ED
Glucose-Capillary: 61 mg/dL — ABNORMAL LOW (ref 70–99)
Glucose-Capillary: 77 mg/dL (ref 70–99)
Glucose-Capillary: 58 mg/dL — ABNORMAL LOW (ref 70–99)
Glucose-Capillary: 124 mg/dL — ABNORMAL HIGH (ref 70–99)

## 2022-08-01 LAB — URINALYSIS, W/ REFLEX TO CULTURE (INFECTION SUSPECTED)
Bacteria, UA: NONE SEEN
Bilirubin Urine: NEGATIVE
Glucose, UA: NEGATIVE mg/dL
Ketones, ur: NEGATIVE mg/dL
Leukocytes,Ua: NEGATIVE
Nitrite: NEGATIVE
Protein, ur: >=300 mg/dL — AB
Specific Gravity, Urine: 1.015 (ref 1.005–1.030)
pH: 5 (ref 5.0–8.0)

## 2022-08-01 LAB — CBC WITH DIFFERENTIAL/PLATELET
Abs Immature Granulocytes: 0.02 10*3/uL (ref 0.00–0.07)
Basophils Absolute: 0 10*3/uL (ref 0.0–0.1)
Basophils Relative: 1 %
Eosinophils Absolute: 0.1 10*3/uL (ref 0.0–0.5)
Eosinophils Relative: 2 %
HCT: 28 % — ABNORMAL LOW (ref 39.0–52.0)
Hemoglobin: 8.9 g/dL — ABNORMAL LOW (ref 13.0–17.0)
Immature Granulocytes: 0 %
Lymphocytes Relative: 17 %
Lymphs Abs: 0.9 10*3/uL (ref 0.7–4.0)
MCH: 27.5 pg (ref 26.0–34.0)
MCHC: 31.8 g/dL (ref 30.0–36.0)
MCV: 86.4 fL (ref 80.0–100.0)
Monocytes Absolute: 0.4 10*3/uL (ref 0.1–1.0)
Monocytes Relative: 8 %
Neutro Abs: 3.9 10*3/uL (ref 1.7–7.7)
Neutrophils Relative %: 72 %
Platelets: 262 10*3/uL (ref 150–400)
RBC: 3.24 MIL/uL — ABNORMAL LOW (ref 4.22–5.81)
RDW: 15.4 % (ref 11.5–15.5)
WBC: 5.3 10*3/uL (ref 4.0–10.5)
nRBC: 0 % (ref 0.0–0.2)

## 2022-08-01 LAB — COMPREHENSIVE METABOLIC PANEL
ALT: 17 U/L (ref 0–44)
AST: 29 U/L (ref 15–41)
Albumin: 3.4 g/dL — ABNORMAL LOW (ref 3.5–5.0)
Alkaline Phosphatase: 89 U/L (ref 38–126)
Anion gap: 7 (ref 5–15)
BUN: 26 mg/dL — ABNORMAL HIGH (ref 6–20)
CO2: 24 mmol/L (ref 22–32)
Calcium: 8.2 mg/dL — ABNORMAL LOW (ref 8.9–10.3)
Chloride: 110 mmol/L (ref 98–111)
Creatinine, Ser: 1.09 mg/dL (ref 0.61–1.24)
GFR, Estimated: >60 mL/min (ref >60)
Glucose, Bld: 74 mg/dL (ref 70–99)
Potassium: 4 mmol/L (ref 3.5–5.1)
Sodium: 141 mmol/L (ref 135–145)
Total Bilirubin: 1.8 mg/dL — ABNORMAL HIGH (ref 0.3–1.2)
Total Protein: 6.6 g/dL (ref 6.5–8.1)

## 2022-08-01 LAB — BRAIN NATRIURETIC PEPTIDE: B Natriuretic Peptide: 94.6 pg/mL (ref 0.0–100.0)

## 2022-08-01 MED ORDER — FUROSEMIDE 10 MG/ML IJ SOLN
40.0000 mg | Freq: Once | INTRAMUSCULAR | Status: AC
  Administered 2022-08-01: 40 mg via INTRAVENOUS
  Filled 2022-08-01: qty 4

## 2022-08-01 MED ORDER — FUROSEMIDE 40 MG PO TABS: 40.0000 mg | ORAL_TABLET | Freq: Every day | ORAL | 0 refills | Status: DC

## 2022-08-01 MED ORDER — MORPHINE SULFATE (PF) 4 MG/ML IV SOLN
4.0000 mg | Freq: Once | INTRAVENOUS | Status: AC
  Administered 2022-08-01: 4 mg via INTRAVENOUS
  Filled 2022-08-01: qty 1

## 2022-08-01 MED ORDER — IOHEXOL 300 MG/ML  SOLN
100.0000 mL | Freq: Once | INTRAMUSCULAR | Status: AC | PRN
  Administered 2022-08-01: 100 mL via INTRAVENOUS

## 2022-08-01 NOTE — ED Notes (Signed)
Patient was able to tolerate fluids. 

## 2022-08-01 NOTE — ED Provider Notes (Signed)
[place] EMERGENCY DEPARTMENT AT [facility] Provider Note   CSN: [id number] Arrival date & time: 08/01/22  1453     History  Chief Complaint  Patient presents with   Groin Swelling    Gabriel Waters is a 47 y.o. male here presenting with groin swelling.  He states that he woke up yesterday and noticed groin swelling.  He states that he noticed worsening swelling today.  Patient denies any heavy lifting or history of hernia.  The sinus testicular pain.  He states that he did not eat much today and felt little lightheaded and dizzy and was noted to have a glucose of 61.   The history is provided by the patient.       Home Medications Prior to Admission medications   Medication Sig Start Date End Date Taking? Authorizing Provider  Accu-Chek Softclix Lancets lancets Check blood sugar three times a day. 30 day supply. No refills 09/30/21   Charise Killian, MD  amLODipine (NORVASC) 10 MG tablet Take 1 tablet (10 mg total) by mouth daily. 02/04/22   Pricilla Riffle, MD  aspirin 81 MG EC tablet Take by mouth. 04/14/19   [provider]  Continuous Blood Gluc Sensor (FREESTYLE LIBRE 3 SENSOR) MISC Place 1 sensor on the skin every 14 days. Use to check glucose continuously. 30 day supply. No refills 09/30/21   Charise Killian, MD  fluticasone [facility] ALLERGY RELIEF) 50 MCG/ACT nasal spray 1 spray daily as needed. 02/13/20   [provider]  hydrALAZINE (APRESOLINE) 50 MG tablet Take 1 tablet (50 mg total) by mouth 3 (three) times daily. 05/21/22 08/19/22  Pricilla Riffle, MD  insulin aspart (NOVOLOG FLEXPEN) 100 UNIT/ML FlexPen 8 units with meals if blood sugar is over 170 Subcutaneous three times per day as needed 10/14/21   [provider]  Insulin Pen Needle (PEN NEEDLES 3/16") 31G X 5 MM MISC Dispense enough pen needles for tresiba 35 units daily, novolog 8 units TID w/ meals x 30 days. No refills. 09/30/21   Charise Killian, MD  Lancet Devices  (SIMPLE DIAGNOSTICS LANCING DEV) MISC 1 each by Misc.(Non-Drug; Combo Route) route 2 times daily. 02/09/20   [provider]  losartan (COZAAR) 100 MG tablet Take 1 tablet (100 mg total) by mouth daily. 02/04/22   Pricilla Riffle, MD  metFORMIN (GLUCOPHAGE) 1000 MG tablet Take 1,000 mg by mouth 2 (two) times daily.    [provider]  Multiple Vitamins-Minerals (ALIVE MULTI-VITAMIN PO) Take by mouth.    [provider]  rosuvastatin (CRESTOR) 20 MG tablet Take 1 tablet (20 mg total) by mouth daily. 02/03/18   Burchette, Elberta Fortis, MD  TRESIBA FLEXTOUCH 100 UNIT/ML FlexTouch Pen 32 units daily    [provider]  triamterene-hydrochlorothiazide (MAXZIDE-25) 37.5-25 MG tablet Take 1 tablet by mouth daily. 02/11/22   Pricilla Riffle, MD      Allergies    Exenatide, Invokana [canagliflozin], Lipitor [atorvastatin], and Lisinopril    Review of Systems   Review of Systems  Genitourinary:        Groin swelling  All other systems reviewed and are negative.   Physical Exam Updated Vital Signs BP (!) 199/87 (BP Location: Right Arm)   Pulse 81   Temp 98.7 F (37.1 C) (Oral)   Resp 18   Ht 6\' 2"  (1.88 m)   Wt (!) 138 kg   SpO2 100%   BMI 39.06 kg/m  Physical Exam Vitals and  nursing note reviewed.  Constitutional:      Appearance: Normal appearance.  HENT:     Head: Normocephalic.     Nose: Nose normal.     Mouth/Throat:     Mouth: Mucous membranes are moist.  Eyes:     Pupils: Pupils are equal, round, and reactive to light.  Cardiovascular:     Rate and Rhythm: Normal rate and regular rhythm.     Pulses: Normal pulses.     Heart sounds: Normal heart sounds.  Pulmonary:     Effort: Pulmonary effort is normal.     Breath sounds: Normal breath sounds.  Abdominal:     General: Abdomen is flat.     Palpations: Abdomen is soft.  Genitourinary:    Comments: + Scrotal swelling, possible indirect hernia versus hydrocele.  Difficult to examine his testicles.   No obvious scrotal cellulitis Musculoskeletal:        General: Normal range of motion.     Cervical back: Normal range of motion and neck supple.  Skin:    General: Skin is warm.     Capillary Refill: Capillary refill takes less than 2 seconds.  Neurological:     General: No focal deficit present.     Mental Status: He is alert and oriented to person, place, and time.  Psychiatric:        Mood and Affect: Mood normal.        Behavior: Behavior normal.     ED Results / Procedures / Treatments   Labs (all labs ordered are listed, but only abnormal results are displayed) Labs Reviewed  CBG MONITORING, ED - Abnormal; Notable for the following components:      Result Value   Glucose-Capillary 61 (*)    All other components within normal limits  URINALYSIS, W/ REFLEX TO CULTURE (INFECTION SUSPECTED)  CBC WITH DIFFERENTIAL/PLATELET  COMPREHENSIVE METABOLIC PANEL  CBG MONITORING, ED    EKG None  Radiology No results found.  Procedures Procedures    Medications Ordered in ED Medications  morphine (PF) 4 MG/ML injection 4 mg (has no administration in time range)    ED Course/ Medical Decision Making/ A&P                             Medical Decision Making Gabriel Waters is a 47 y.o. male here presenting with groin swelling.  Concern for possible hernia versus hydrocele.  Plan to get ultrasound and urinalysis.  Patient is hypoglycemic.  Will give orange juice and check chemistry.  9:46 PM Patient's ultrasound showed scrotal wall edema.  CT did not show any hernia but just bilateral hydroceles.  Patient also has some anasarca on his CT scan as well as pleural effusions.  Patient's BNP is normal.  Patient appears fluid overloaded.  Patient was given Lasix in the ED and will be discharged with a course of Lasix.  Of note his glucose was low and he was eating some food and his glucose on discharge was 124.  Problems Addressed: Anasarca: acute illness or injury Hydrocele in  adult: acute illness or injury Hypertension, unspecified type: acute illness or injury  Amount and/or Complexity of Data Reviewed Labs: ordered. Decision-making details documented in ED Course. Radiology: ordered and independent interpretation performed. Decision-making details documented in ED Course.  Risk Prescription drug management.   Final Clinical Impression(s) / ED Diagnoses Final diagnoses:  None    Rx / DC Orders ED Discharge Orders  None         Charlynne Pander, MD 08/01/22 [phone]

## 2022-08-01 NOTE — Discharge Instructions (Addendum)
You have hydrocele and swelling around the testicle.  You need to take Lasix 40 mg daily for a week  I recommend you follow-up with urology  Return to ER if you have worse leg swelling, scrotal swelling

## 2022-08-01 NOTE — ED Triage Notes (Signed)
Scrotum swelling x few days.

## 2022-08-01 NOTE — ED Notes (Signed)
CBG 61, pt was given orange juice

## 2022-08-19 ENCOUNTER — Encounter: Payer: Self-pay | Admitting: Internal Medicine

## 2022-08-19 DIAGNOSIS — N181 Chronic kidney disease, stage 1: Secondary | ICD-10-CM | POA: Diagnosis not present

## 2022-08-19 DIAGNOSIS — I10 Essential (primary) hypertension: Secondary | ICD-10-CM

## 2022-08-19 DIAGNOSIS — E1165 Type 2 diabetes mellitus with hyperglycemia: Secondary | ICD-10-CM | POA: Diagnosis not present

## 2022-08-19 DIAGNOSIS — E782 Mixed hyperlipidemia: Secondary | ICD-10-CM | POA: Diagnosis not present

## 2022-08-20 MED ORDER — SPIRONOLACTONE 25 MG PO TABS: 25.0000 mg | ORAL_TABLET | Freq: Every day | ORAL | 3 refills | Status: DC

## 2022-08-20 MED ORDER — HYDRALAZINE HCL 100 MG PO TABS
100.0000 mg | ORAL_TABLET | Freq: Three times a day (TID) | ORAL | 3 refills | Status: DC
Start: 2022-08-20 — End: 2022-09-08

## 2022-08-20 NOTE — Telephone Encounter (Signed)
I would increase hydralazine to 100 mg  tid Add spironolactone 25 mg daily Follow up BMET and BNP in 10 days

## 2022-08-21 DIAGNOSIS — N5082 Scrotal pain: Secondary | ICD-10-CM | POA: Diagnosis not present

## 2022-08-21 DIAGNOSIS — N492 Inflammatory disorders of scrotum: Secondary | ICD-10-CM | POA: Diagnosis not present

## 2022-08-26 ENCOUNTER — Encounter: Payer: Self-pay | Admitting: Neurology

## 2022-08-26 ENCOUNTER — Ambulatory Visit: Payer: BLUE CROSS/BLUE SHIELD | Admitting: Neurology

## 2022-08-28 DIAGNOSIS — M14671 Charcot's joint, right ankle and foot: Secondary | ICD-10-CM | POA: Diagnosis not present

## 2022-09-01 ENCOUNTER — Other Ambulatory Visit: Payer: BLUE CROSS/BLUE SHIELD

## 2022-09-01 DIAGNOSIS — G4733 Obstructive sleep apnea (adult) (pediatric): Secondary | ICD-10-CM | POA: Diagnosis not present

## 2022-09-02 ENCOUNTER — Ambulatory Visit: Payer: BLUE CROSS/BLUE SHIELD | Attending: Internal Medicine

## 2022-09-02 DIAGNOSIS — I10 Essential (primary) hypertension: Secondary | ICD-10-CM

## 2022-09-02 DIAGNOSIS — E1165 Type 2 diabetes mellitus with hyperglycemia: Secondary | ICD-10-CM | POA: Diagnosis not present

## 2022-09-02 LAB — BASIC METABOLIC PANEL
BUN/Creatinine Ratio: 19 (ref 9–20)
BUN: 24 mg/dL (ref 6–24)
CO2: 22 mmol/L (ref 20–29)
Calcium: 9.1 mg/dL (ref 8.7–10.2)
Chloride: 107 mmol/L — ABNORMAL HIGH (ref 96–106)
Creatinine, Ser: 1.24 mg/dL (ref 0.76–1.27)
Glucose: 133 mg/dL — ABNORMAL HIGH (ref 70–99)
Potassium: 3.9 mmol/L (ref 3.5–5.2)
Sodium: 143 mmol/L (ref 134–144)
eGFR: 72 mL/min/{1.73_m2} (ref >59)

## 2022-09-02 LAB — PRO B NATRIURETIC PEPTIDE

## 2022-09-03 ENCOUNTER — Other Ambulatory Visit: Payer: Self-pay

## 2022-09-03 DIAGNOSIS — Z79899 Other long term (current) drug therapy: Secondary | ICD-10-CM

## 2022-09-03 DIAGNOSIS — I10 Essential (primary) hypertension: Secondary | ICD-10-CM

## 2022-09-03 DIAGNOSIS — N179 Acute kidney failure, unspecified: Secondary | ICD-10-CM

## 2022-09-03 MED ORDER — FUROSEMIDE 40 MG PO TABS: ORAL_TABLET | ORAL | Status: DC

## 2022-09-04 ENCOUNTER — Telehealth: Payer: Self-pay

## 2022-09-04 NOTE — Telephone Encounter (Signed)
-----   Message from [place] sent at 09/03/2022  3:00 PM EDT ----- Electrolytes and kidney function are normal Fluid is up some    I would increase lasix to 80 mg 2x per week   (every 3rd day) Check BMET and BNP in 3 wks   How is his BP?

## 2022-09-04 NOTE — Telephone Encounter (Signed)
Left a message for the pt to call back and sent the pt a My Chart.

## 2022-09-05 MED ORDER — FUROSEMIDE 40 MG PO TABS: ORAL_TABLET | ORAL | Status: DC

## 2022-09-05 NOTE — Telephone Encounter (Signed)
Needs appt to get seen / examined.

## 2022-09-05 NOTE — Addendum Note (Signed)
Addended by: Bertram Millard on: 09/05/2022 12:44 PM   Modules accepted: Orders

## 2022-09-05 NOTE — Telephone Encounter (Signed)
Attempted to call the pt again, but will continue to send My Chart messages.. pt has been responding.

## 2022-09-08 ENCOUNTER — Ambulatory Visit: Payer: BLUE CROSS/BLUE SHIELD | Attending: Internal Medicine | Admitting: Internal Medicine

## 2022-09-08 ENCOUNTER — Encounter: Payer: Self-pay | Admitting: Internal Medicine

## 2022-09-08 ENCOUNTER — Telehealth: Payer: Self-pay | Admitting: Internal Medicine

## 2022-09-08 VITALS — BP 168/76 | HR 96 | Ht 74.0 in | Wt 332.4 lb

## 2022-09-08 DIAGNOSIS — E1165 Type 2 diabetes mellitus with hyperglycemia: Secondary | ICD-10-CM | POA: Diagnosis not present

## 2022-09-08 DIAGNOSIS — Z794 Long term (current) use of insulin: Secondary | ICD-10-CM

## 2022-09-08 DIAGNOSIS — G4733 Obstructive sleep apnea (adult) (pediatric): Secondary | ICD-10-CM

## 2022-09-08 DIAGNOSIS — E8770 Fluid overload, unspecified: Secondary | ICD-10-CM

## 2022-09-08 DIAGNOSIS — N179 Acute kidney failure, unspecified: Secondary | ICD-10-CM | POA: Diagnosis not present

## 2022-09-08 DIAGNOSIS — Z79899 Other long term (current) drug therapy: Secondary | ICD-10-CM | POA: Diagnosis not present

## 2022-09-08 DIAGNOSIS — I10 Essential (primary) hypertension: Secondary | ICD-10-CM | POA: Diagnosis not present

## 2022-09-08 DIAGNOSIS — E785 Hyperlipidemia, unspecified: Secondary | ICD-10-CM

## 2022-09-08 MED ORDER — FUROSEMIDE 40 MG PO TABS: 40.0000 mg | ORAL_TABLET | Freq: Every day | ORAL | 3 refills | Status: AC

## 2022-09-08 NOTE — Telephone Encounter (Signed)
Patient called to confirm to RN Dewayne Hatch that he will be at his 10:20 am visit today.

## 2022-09-08 NOTE — Patient Instructions (Signed)
Medication Instructions:  Stop Hydralazine  Start lasix/ Furosemide 40 mg daily (Take 2 today)  *If you need a refill on your cardiac medications before your next appointment, please call your pharmacy*   Lab Work: CMET, CBC, PRO BNP, ANA   If you have labs (blood work) drawn today and your tests are completely normal, you will receive your results only by: MyChart Message (if you have MyChart) OR A paper copy in the mail If you have any lab test that is abnormal or we need to change your treatment, we will call you to review the results.   Testing/Procedures: LIMITED ECHO ASAP    Follow-Up: At [facility], you and your health needs are our priority.  As part of our continuing mission to provide you with exceptional heart care, we have created designated Provider Care Teams.  These Care Teams include your primary Cardiologist (physician) and Advanced Practice Providers (APPs -  Physician Assistants and Nurse Practitioners) who all work together to provide you with the care you need, when you need it.  We recommend signing up for the patient portal called "MyChart".  Sign up information is provided on this After Visit Summary.  MyChart is used to connect with patients for Virtual Visits (Telemedicine).  Patients are able to view lab/test results, encounter notes, upcoming appointments, etc.  Non-urgent messages can be sent to your provider as well.   To learn more about what you can do with MyChart, go to [email or web address].

## 2022-09-08 NOTE — Progress Notes (Unsigned)
Cardiology Office Note   Date:  09/08/2022   ID:  [name], DOB 1975/03/31, MRN [id number]  PCP:  [name], NP  Cardiologist:   Dietrich Pates, MD   Patient referred for evaluation of HTN     History of Present Illness: Gabriel Waters is a 47 y.o. male with a history ofType II DM (Dx 10 years ago), HTN, obesity   Followed by Rinaldo Cloud    Referred for HTN  The pt says his BP hs not been optimal    Usually 140s/ 80s to 90s     He denies CP  Breathing is OK   Pt hsa Hx T2DM , diagnosed 10 years ago     IN Sept the pt was admitted in a hyperosmolar, hyperglycemic state  A1C 13.2     Admits to eating fast food.     Since then he has made marked changes in his diet   On metformin and SS insulin   A1C is now  in 6s    Curently takes Takes glucophage and insulin     Diet: Br  Skips or Cereal ore eggs  [place] sausage Lunch   Salad or grilled checken Dinner   Depends  Same   Drinks  Water   Occaional diet soda  Current Meds  Medication Sig   Accu-Chek Softclix Lancets lancets Check blood sugar three times a day. 30 day supply. No refills   amLODipine (NORVASC) 10 MG tablet Take 1 tablet (10 mg total) by mouth daily.   aspirin 81 MG EC tablet Take by mouth.   Continuous Blood Gluc Sensor (FREESTYLE LIBRE 3 SENSOR) MISC Place 1 sensor on the skin every 14 days. Use to check glucose continuously. 30 day supply. No refills   fluticasone (FLONASE ALLERGY RELIEF) 50 MCG/ACT nasal spray 1 spray daily as needed.   hydrALAZINE (APRESOLINE) 100 MG tablet Take 1 tablet (100 mg total) by mouth 3 (three) times daily.   insulin aspart (NOVOLOG FLEXPEN) 100 UNIT/ML FlexPen Sliding scale   Insulin Pen Needle (PEN NEEDLES 3/16") 31G X 5 MM MISC Dispense enough pen needles for tresiba 35 units daily, novolog 8 units TID w/ meals x 30 days. No refills. (Patient taking differently: Dispense enough pen needles for tresiba 26 units daily, novolog 8 units TID w/ meals x 30 days. No refills.)   Lancet  Devices (SIMPLE DIAGNOSTICS LANCING DEV) MISC 1 each by Misc.(Non-Drug; Combo Route) route 2 times daily.   losartan (COZAAR) 100 MG tablet Take 1 tablet (100 mg total) by mouth daily.   metFORMIN (GLUCOPHAGE) 1000 MG tablet Take 1,000 mg by mouth 2 (two) times daily.   Multiple Vitamins-Minerals (ALIVE MULTI-VITAMIN PO) Take by mouth.   rosuvastatin (CRESTOR) 20 MG tablet Take 1 tablet (20 mg total) by mouth daily.   spironolactone (ALDACTONE) 25 MG tablet Take 1 tablet (25 mg total) by mouth daily.   TRESIBA FLEXTOUCH 100 UNIT/ML FlexTouch Pen 32 units daily   Current Facility-Administered Medications for the 09/08/22 encounter (Office Visit) with Pricilla Riffle, MD  Medication   dextrose 5 % solution     Allergies:   Exenatide, Invokana [canagliflozin], Lipitor [atorvastatin], and Lisinopril   Past Medical History:  Diagnosis Date   Acute pancreatitis 09/13/2013   Allergy    SEASONAL   Anxiety    Arthritis    RIGHT SHOULDER   Depression    DIABETES MELLITUS, TYPE II, UNCONTROLLED 01/02/2010   ELEVATED BLOOD PRESSURE 12/31/2009   GERD (  gastroesophageal reflux disease)    Hyperlipidemia    Hypertension    Neuropathy    FEET BILATERAL   Pancreatitis 09/13/2013   Sleep apnea     Past Surgical History:  Procedure Laterality Date   CHOLECYSTECTOMY, LAPAROSCOPIC  03/15/2019   WISDOM TOOTH EXTRACTION       Social History:  The patient  reports that he has never smoked. He has been exposed to tobacco smoke. He has never used smokeless tobacco. He reports current alcohol use. He reports that he does not use drugs.   Family History:  The patient's family history includes Allergies in his mother; Colon polyps in his mother and sister; Diabetes in his father; Emphysema in his father; Heart disease in his father and mother; Sarcoidosis in his father.    ROS:  Please see the history of present illness. All other systems are reviewed and  Negative to the above problem except as  noted.    PHYSICAL EXAM: VS:  BP (!) 168/76   Pulse 96   Ht 6\' 2"  (1.88 m)   Wt (!) 332 lb 6.4 oz (150.8 kg)   SpO2 98%   BMI 42.68 kg/m   GEN: Well nourished, well developed, in no acute distress  HEENT: normal  Neck: no JVD, no carotid bruits Cardiac: RRR;  No murmurs  No LE  edema  Respiratory:  clear to auscultation bilaterally GI: soft, nontender, nondistended, + BS  No hepatomegaly  MS: no deformity Moving all extremities   Skin: warm and dry, no rash Neuro:  Strength and sensation are intact Psych: euthymic mood, full affect   EKG:  EKG is not  ordered today.   Lipid Panel    Component Value Date/Time   CHOL 136 05/10/2018 0841   TRIG 79.0 05/10/2018 0841   HDL 37.70 (L) 05/10/2018 0841   CHOLHDL 4 05/10/2018 0841   VLDL 15.8 05/10/2018 0841   LDLCALC 83 05/10/2018 0841   LDLDIRECT 129.0 03/31/2017 1542      Wt Readings from Last 3 Encounters:  09/08/22 (!) 332 lb 6.4 oz (150.8 kg)  08/01/22 (!) 304 lb 3.8 oz (138 kg)  05/26/22 (!) 305 lb (138.3 kg)      ASSESSMENT AND PLAN:  1  HTN  Hx of HTN that has not been optimally controlled    Some may be due to sleep apnea   Will sched a home sleep evaluatoin Will get labs today before  recomm medical Rx for BP      2  DM   Congratulated patient on the marked changes in diet that he has made with such great results     3  HL  Will check lipids  today    Pateint was told in past that he had an enlarged heart   Will get echo        Current medicines are reviewed at length with the patient today.  The patient does not have concerns regarding medicines.  Signed, Dietrich Pates, MD  09/08/2022 10:50 AM    [facility] [address] [place], [place], [place]  [address] Phone: [phone]; Fax: [phone]

## 2022-09-09 ENCOUNTER — Ambulatory Visit (HOSPITAL_COMMUNITY): Payer: BLUE CROSS/BLUE SHIELD | Attending: Cardiology

## 2022-09-09 DIAGNOSIS — I361 Nonrheumatic tricuspid (valve) insufficiency: Secondary | ICD-10-CM

## 2022-09-09 DIAGNOSIS — E8770 Fluid overload, unspecified: Secondary | ICD-10-CM | POA: Diagnosis not present

## 2022-09-09 LAB — CBC
Hematocrit: 28.4 % — ABNORMAL LOW (ref 37.5–51.0)
Hemoglobin: 9.6 g/dL — ABNORMAL LOW (ref 13.0–17.7)
MCH: 27.1 pg (ref 26.6–33.0)
MCHC: 33.8 g/dL (ref 31.5–35.7)
MCV: 80 fL (ref 79–97)
Platelets: 316 10*3/uL (ref 150–450)
RBC: 3.54 x10E6/uL — ABNORMAL LOW (ref 4.14–5.80)
RDW: 14.9 % (ref 11.6–15.4)
WBC: 5.4 10*3/uL (ref 3.4–10.8)

## 2022-09-09 LAB — COMPREHENSIVE METABOLIC PANEL
ALT: 19 IU/L (ref 0–44)
AST: 30 IU/L (ref 0–40)
Albumin: 3.6 g/dL — ABNORMAL LOW (ref 4.1–5.1)
Alkaline Phosphatase: 99 IU/L (ref 44–121)
BUN/Creatinine Ratio: 12 (ref 9–20)
BUN: 16 mg/dL (ref 6–24)
Bilirubin Total: 1.6 mg/dL — ABNORMAL HIGH (ref 0.0–1.2)
CO2: 23 mmol/L (ref 20–29)
Calcium: 8.8 mg/dL (ref 8.7–10.2)
Chloride: 102 mmol/L (ref 96–106)
Creatinine, Ser: 1.34 mg/dL — ABNORMAL HIGH (ref 0.76–1.27)
Globulin, Total: 2.7 g/dL (ref 1.5–4.5)
Glucose: 140 mg/dL — ABNORMAL HIGH (ref 70–99)
Potassium: 3.5 mmol/L (ref 3.5–5.2)
Sodium: 140 mmol/L (ref 134–144)
Total Protein: 6.3 g/dL (ref 6.0–8.5)
eGFR: 65 mL/min/{1.73_m2} (ref >59)

## 2022-09-09 LAB — ECHOCARDIOGRAM LIMITED: Area-P 1/2: 4.8 cm2

## 2022-09-09 LAB — ANA: Anti Nuclear Antibody (ANA): NEGATIVE

## 2022-09-09 LAB — PRO B NATRIURETIC PEPTIDE: NT-Pro BNP: 1397 pg/mL — ABNORMAL HIGH (ref 0–121)

## 2022-09-11 ENCOUNTER — Telehealth: Payer: Self-pay | Admitting: Internal Medicine

## 2022-09-11 MED ORDER — POTASSIUM CHLORIDE ER 10 MEQ PO TBCR: 10.0000 meq | EXTENDED_RELEASE_TABLET | Freq: Every day | ORAL | 3 refills | Status: DC

## 2022-09-11 NOTE — Telephone Encounter (Signed)
-----   Message from [place] sent at 09/11/2022  5:11 PM EDT ----- Called pt Urinating a lot  (9x per day) Swelling going down   Still has BP 140s to 160s Recomm Call in for 10 mEq KCL to take while on lasix 40  Start tomorrow I would add BMET and BNP on Monday

## 2022-09-11 NOTE — Telephone Encounter (Signed)
Discussed lab results per Dr. Tenny Craw:  ----- Message from Dietrich Pates sent at 09/11/2022  5:11 PM EDT ----- Called pt Urinating a lot  (9x per day) Swelling going down   Still has BP 140s to 160s Recomm Call in for 10 mEq KCL to take while on lasix 40  Start tomorrow I would add BMET and BNP on Monday    Potassium chloride daily sent to [facility] in [place] per Pt request.  Lab appt scheduled for 09/15/22 to draw BMET and BNP.  Patient verbalized understanding of the above and expressed appreciation for call.

## 2022-09-15 ENCOUNTER — Ambulatory Visit: Payer: BLUE CROSS/BLUE SHIELD | Attending: Internal Medicine

## 2022-09-15 DIAGNOSIS — N179 Acute kidney failure, unspecified: Secondary | ICD-10-CM | POA: Diagnosis not present

## 2022-09-15 DIAGNOSIS — Z79899 Other long term (current) drug therapy: Secondary | ICD-10-CM

## 2022-09-15 DIAGNOSIS — I10 Essential (primary) hypertension: Secondary | ICD-10-CM | POA: Diagnosis not present

## 2022-09-16 LAB — BASIC METABOLIC PANEL
BUN/Creatinine Ratio: 13 (ref 9–20)
BUN: 15 mg/dL (ref 6–24)
CO2: 28 mmol/L (ref 20–29)
Calcium: 8.5 mg/dL — ABNORMAL LOW (ref 8.7–10.2)
Chloride: 104 mmol/L (ref 96–106)
Creatinine, Ser: 1.17 mg/dL (ref 0.76–1.27)
Glucose: 159 mg/dL — ABNORMAL HIGH (ref 70–99)
Potassium: 3.9 mmol/L (ref 3.5–5.2)
Sodium: 142 mmol/L (ref 134–144)
eGFR: 77 mL/min/{1.73_m2} (ref >59)

## 2022-09-16 LAB — PRO B NATRIURETIC PEPTIDE: NT-Pro BNP: 1083 pg/mL — ABNORMAL HIGH (ref 0–121)

## 2022-09-17 ENCOUNTER — Telehealth: Payer: Self-pay | Admitting: Internal Medicine

## 2022-09-17 NOTE — Telephone Encounter (Signed)
Left another message for the pt to call back.  

## 2022-09-17 NOTE — Telephone Encounter (Signed)
Patient calling back for results. Please advise °

## 2022-09-18 NOTE — Telephone Encounter (Signed)
I spoke with the pt and he has lost 18 lbs since starting the lasix... he still has some fluid but much improved.. he will monitor and let us know how he is doing and I plan to get him an appt with Dr Tenny Craw in the next week or so.

## 2022-09-24 NOTE — Telephone Encounter (Signed)
Left the pt a message to see if he can come at 4:20 pm... someone had canceled off of Dr Tenny Craw schedule today.

## 2022-09-26 ENCOUNTER — Other Ambulatory Visit: Payer: BLUE CROSS/BLUE SHIELD

## 2022-09-29 NOTE — Telephone Encounter (Signed)
Left a message to see to make an appt with DR Tenny Craw this Friday morning 10/03/22.

## 2022-09-29 NOTE — Telephone Encounter (Signed)
Holding spot for him with Dr Tenny Craw 10/03/22 at 10:20 am.

## 2022-10-02 DIAGNOSIS — G4733 Obstructive sleep apnea (adult) (pediatric): Secondary | ICD-10-CM | POA: Diagnosis not present

## 2022-10-02 NOTE — Progress Notes (Signed)
Cardiology Office Note   Date:  10/03/2022   ID:  [name], DOB 08/27/1975, MRN [id number]  PCP:  [name], NP  Cardiologist:   Dietrich Pates, MD   Patient presents for follow up of HTN and edema       History of Present Illness: Gabriel Waters is a 47 y.o. male with a history ofType II DM (Dx 10 years ago), HTN, obesity   I saw the pt for the first time in Jan 2024    Increased losartan to 100 and amlodipine 10 mg  Echo done  LVEF and RVEF were normal   Normal valve function Seen in [facility] ([facility]) in Feb Set  up for CPAP for OSA     Pt continued to have high blood pressure Seen at [facility] in  April 2024.    Called in with readings  Over time  Maxzide added to regimen (37/5/25)  Hydralazine increased to 50 and then 100 tid   Spironolactone also added to regimen at 25 mg   Pt seen in ER in July 2024 for anasarca  CT of abdomen done   Sent home with course of lasix     He has continued to have marked LE swelling, scrotal swelling   Very uncomfortable     I last saw the patient on Sep 08, 2022  He was markely volume overloaded   I recomm pt stop  hydralazine and starting lasix    Echo on 09/09/22 showed LVEF and RVEF were normal   Since seen he has diuresed signficantly with improvement in edema and weight decrease He said he is feeling much better   Still with edema but improved      Denies CP   NO dizziness  Breathing is OK    Current Meds  Medication Sig   Accu-Chek Softclix Lancets lancets Check blood sugar three times a day. 30 day supply. No refills   amLODipine (NORVASC) 5 MG tablet Take 1 tablet (5 mg total) by mouth daily.   aspirin 81 MG EC tablet Take by mouth.   Continuous Blood Gluc Sensor (FREESTYLE LIBRE 3 SENSOR) MISC Place 1 sensor on the skin every 14 days. Use to check glucose continuously. 30 day supply. No refills   fluticasone (FLONASE ALLERGY RELIEF) 50 MCG/ACT nasal spray 1 spray daily as needed.   furosemide (LASIX) 40 MG tablet  Take 1 tablet (40 mg total) by mouth daily.   insulin aspart (NOVOLOG FLEXPEN) 100 UNIT/ML FlexPen Sliding scale   Insulin Pen Needle (PEN NEEDLES 3/16") 31G X 5 MM MISC Dispense enough pen needles for tresiba 35 units daily, novolog 8 units TID w/ meals x 30 days. No refills. (Patient taking differently: Dispense enough pen needles for tresiba 26 units daily, novolog 8 units TID w/ meals x 30 days. No refills.)   Lancet Devices (SIMPLE DIAGNOSTICS LANCING DEV) MISC 1 each by Misc.(Non-Drug; Combo Route) route 2 times daily.   losartan (COZAAR) 100 MG tablet Take 1 tablet (100 mg total) by mouth daily.   metFORMIN (GLUCOPHAGE) 1000 MG tablet Take 1,000 mg by mouth 2 (two) times daily.   Multiple Vitamins-Minerals (ALIVE MULTI-VITAMIN PO) Take by mouth.   potassium chloride (KLOR-CON) 10 MEQ tablet Take 1 tablet (10 mEq total) by mouth daily.   rosuvastatin (CRESTOR) 20 MG tablet Take 1 tablet (20 mg total) by mouth daily.   spironolactone (ALDACTONE) 25 MG tablet Take 1 tablet (25 mg total) by mouth daily.  TRESIBA FLEXTOUCH 100 UNIT/ML FlexTouch Pen 32 units daily   [DISCONTINUED] amLODipine (NORVASC) 10 MG tablet Take 1 tablet (10 mg total) by mouth daily.   Current Facility-Administered Medications for the 10/03/22 encounter (Office Visit) with Pricilla Riffle, MD  Medication   dextrose 5 % solution     Allergies:   Exenatide, Invokana [canagliflozin], Lipitor [atorvastatin], and Lisinopril   Past Medical History:  Diagnosis Date   Acute pancreatitis 09/13/2013   Allergy    SEASONAL   Anxiety    Arthritis    RIGHT SHOULDER   Depression    DIABETES MELLITUS, TYPE II, UNCONTROLLED 01/02/2010   ELEVATED BLOOD PRESSURE 12/31/2009   GERD (gastroesophageal reflux disease)    Hyperlipidemia    Hypertension    Neuropathy    FEET BILATERAL   Pancreatitis 09/13/2013   Sleep apnea     Past Surgical History:  Procedure Laterality Date   CHOLECYSTECTOMY, LAPAROSCOPIC  03/15/2019    WISDOM TOOTH EXTRACTION       Social History:  The patient  reports that he has never smoked. He has been exposed to tobacco smoke. He has never used smokeless tobacco. He reports current alcohol use. He reports that he does not use drugs.   Family History:  The patient's family history includes Allergies in his mother; Colon polyps in his mother and sister; Diabetes in his father; Emphysema in his father; Heart disease in his father and mother; Sarcoidosis in his father.    ROS:  Please see the history of present illness. All other systems are reviewed and  Negative to the above problem except as noted.    PHYSICAL EXAM: VS:  BP 128/84 (BP Location: Right Arm, Patient Position: Sitting, Cuff Size: Large)   Pulse 82   Ht 6\' 2"  (1.88 m)   Wt (!) 319 lb 12.8 oz (145.1 kg)   SpO2 97%   BMI 41.06 kg/m   GEN: Well nourished, well developed, in no acute distress  HEENT: normal  Neck: JVP not elevated  Cardiac: RRR;  No murmurs    Respiratory:  clear to auscultation bilaterally GI: soft, nontender  No hepatomegaly  Ext  1+ edema to knee  EKG:  EKG is not  ordered today.  Echo   09/09/22   1. Left ventricular ejection fraction, by estimation, is 60 to 65%. The  left ventricle has normal function. The left ventricle has no regional  wall motion abnormalities. There is severe concentric left ventricular  hypertrophy. Left ventricular diastolic   parameters are indeterminate.   2. Right ventricular systolic function is normal. The right ventricular  size is moderately enlarged.   3. Left atrial size was mildly dilated.   4. The mitral valve is normal in structure. Mild mitral valve  regurgitation. No evidence of mitral stenosis.   5. The aortic valve is normal in structure. Aortic valve regurgitation is  trivial. Aortic valve sclerosis is present, with no evidence of aortic  valve stenosis.   6. The inferior vena cava is dilated in size with >50% respiratory  variability, suggesting  right atrial pressure of 8 mmHg.   Lipid Panel    Component Value Date/Time   CHOL 136 05/10/2018 0841   TRIG 79.0 05/10/2018 0841   HDL 37.70 (L) 05/10/2018 0841   CHOLHDL 4 05/10/2018 0841   VLDL 15.8 05/10/2018 0841   LDLCALC 83 05/10/2018 0841   LDLDIRECT 129.0 03/31/2017 1542      Wt Readings from Last 3 Encounters:  10/03/22 Marland Kitchen)  319 lb 12.8 oz (145.1 kg)  09/08/22 (!) 332 lb 6.4 oz (150.8 kg)  08/01/22 (!) 304 lb 3.8 oz (138 kg)      ASSESSMENT AND PLAN:  1  Anasarca  Pt has marked improvement in LE edema   STill with some  edema     I would get labs today  (BMET/ BNP) I have also asked him to cut back on amlodipine  to 5mg  per day to see if this helps     2  HTN  BP is much improved with volume loss  Follow as make med changes   3  Anemia   Hgb 8.9 on last visit   Normal indices   ? Dilutional component   Will repeat CBC   3  OSA  Tolerating CPAP now that volume down  COntniue    4  DM   A1C 5.2 in July   IMproved from previous     3  HL  LDL 89 in April 2024  Continue Crestor    Current medicines are reviewed at length with the patient today.  The patient does not have concerns regarding medicines.  Signed, Dietrich Pates, MD  10/03/2022 9:09 AM    [facility] [address] [place], [place], [place]  [address] Phone: [phone]; Fax: [phone]

## 2022-10-03 ENCOUNTER — Ambulatory Visit: Payer: BLUE CROSS/BLUE SHIELD | Admitting: Internal Medicine

## 2022-10-03 ENCOUNTER — Ambulatory Visit: Payer: BLUE CROSS/BLUE SHIELD | Attending: Internal Medicine | Admitting: Internal Medicine

## 2022-10-03 ENCOUNTER — Encounter: Payer: Self-pay | Admitting: Internal Medicine

## 2022-10-03 VITALS — BP 128/84 | HR 82 | Ht 74.0 in | Wt 319.8 lb

## 2022-10-03 DIAGNOSIS — N179 Acute kidney failure, unspecified: Secondary | ICD-10-CM | POA: Diagnosis not present

## 2022-10-03 DIAGNOSIS — E668 Other obesity: Secondary | ICD-10-CM | POA: Diagnosis not present

## 2022-10-03 DIAGNOSIS — E559 Vitamin D deficiency, unspecified: Secondary | ICD-10-CM | POA: Diagnosis not present

## 2022-10-03 DIAGNOSIS — E1122 Type 2 diabetes mellitus with diabetic chronic kidney disease: Secondary | ICD-10-CM | POA: Diagnosis not present

## 2022-10-03 DIAGNOSIS — N182 Chronic kidney disease, stage 2 (mild): Secondary | ICD-10-CM | POA: Diagnosis not present

## 2022-10-03 DIAGNOSIS — E1165 Type 2 diabetes mellitus with hyperglycemia: Secondary | ICD-10-CM

## 2022-10-03 DIAGNOSIS — E1100 Type 2 diabetes mellitus with hyperosmolarity without nonketotic hyperglycemic-hyperosmolar coma (NKHHC): Secondary | ICD-10-CM | POA: Diagnosis not present

## 2022-10-03 DIAGNOSIS — I10 Essential (primary) hypertension: Secondary | ICD-10-CM

## 2022-10-03 DIAGNOSIS — R809 Proteinuria, unspecified: Secondary | ICD-10-CM | POA: Diagnosis not present

## 2022-10-03 DIAGNOSIS — Z794 Long term (current) use of insulin: Secondary | ICD-10-CM

## 2022-10-03 DIAGNOSIS — E785 Hyperlipidemia, unspecified: Secondary | ICD-10-CM

## 2022-10-03 DIAGNOSIS — Z79899 Other long term (current) drug therapy: Secondary | ICD-10-CM

## 2022-10-03 DIAGNOSIS — I129 Hypertensive chronic kidney disease with stage 1 through stage 4 chronic kidney disease, or unspecified chronic kidney disease: Secondary | ICD-10-CM | POA: Diagnosis not present

## 2022-10-03 MED ORDER — AMLODIPINE BESYLATE 5 MG PO TABS: 5.0000 mg | ORAL_TABLET | Freq: Every day | ORAL | 2 refills | Status: DC

## 2022-10-03 NOTE — Patient Instructions (Signed)
Medication Instructions:   DECREASE YOUR AMLODIPINE TO 5 MG BY MOUTH DAILY  *If you need a refill on your cardiac medications before your next appointment, please call your pharmacy*   Lab Work:  TODAY--BMET, PRO-BNP, CBC W DIFF  If you have labs (blood work) drawn today and your tests are completely normal, you will receive your results only by: MyChart Message (if you have MyChart) OR A paper copy in the mail If you have any lab test that is abnormal or we need to change your treatment, we will call you to review the results.    Follow-Up:  WILL BASE FOLLOW-UP PENDING YOUR LAB RESULTS PER DR. Tenny Craw

## 2022-10-04 LAB — BASIC METABOLIC PANEL
BUN/Creatinine Ratio: 17 (ref 9–20)
BUN: 22 mg/dL (ref 6–24)
CO2: 26 mmol/L (ref 20–29)
Calcium: 8.6 mg/dL — ABNORMAL LOW (ref 8.7–10.2)
Chloride: 103 mmol/L (ref 96–106)
Creatinine, Ser: 1.26 mg/dL (ref 0.76–1.27)
Glucose: 162 mg/dL — ABNORMAL HIGH (ref 70–99)
Potassium: 4 mmol/L (ref 3.5–5.2)
Sodium: 141 mmol/L (ref 134–144)
eGFR: 70 mL/min/{1.73_m2} (ref >59)

## 2022-10-04 LAB — CBC WITH DIFFERENTIAL/PLATELET
Basophils Absolute: 0.1 10*3/uL (ref 0.0–0.2)
Basos: 1 %
EOS (ABSOLUTE): 0.2 10*3/uL (ref 0.0–0.4)
Eos: 3 %
Hematocrit: 25.7 % — ABNORMAL LOW (ref 37.5–51.0)
Hemoglobin: 8.4 g/dL — ABNORMAL LOW (ref 13.0–17.7)
Immature Grans (Abs): 0 10*3/uL (ref 0.0–0.1)
Immature Granulocytes: 0 %
Lymphocytes Absolute: 1.2 10*3/uL (ref 0.7–3.1)
Lymphs: 20 %
MCH: 26.5 pg — ABNORMAL LOW (ref 26.6–33.0)
MCHC: 32.7 g/dL (ref 31.5–35.7)
MCV: 81 fL (ref 79–97)
Monocytes Absolute: 0.5 10*3/uL (ref 0.1–0.9)
Monocytes: 9 %
Neutrophils Absolute: 3.9 10*3/uL (ref 1.4–7.0)
Neutrophils: 67 %
Platelets: 314 10*3/uL (ref 150–450)
RBC: 3.17 x10E6/uL — ABNORMAL LOW (ref 4.14–5.80)
RDW: 14.8 % (ref 11.6–15.4)
WBC: 5.9 10*3/uL (ref 3.4–10.8)

## 2022-10-04 LAB — PRO B NATRIURETIC PEPTIDE: NT-Pro BNP: 1905 pg/mL — ABNORMAL HIGH (ref 0–121)

## 2022-10-06 ENCOUNTER — Other Ambulatory Visit: Payer: Self-pay

## 2022-10-06 DIAGNOSIS — D649 Anemia, unspecified: Secondary | ICD-10-CM

## 2022-10-06 DIAGNOSIS — Z79899 Other long term (current) drug therapy: Secondary | ICD-10-CM

## 2022-10-07 ENCOUNTER — Other Ambulatory Visit: Payer: Self-pay | Admitting: Nephrology

## 2022-10-07 ENCOUNTER — Telehealth: Payer: Self-pay | Admitting: Internal Medicine

## 2022-10-07 DIAGNOSIS — E669 Obesity, unspecified: Secondary | ICD-10-CM

## 2022-10-07 DIAGNOSIS — R809 Proteinuria, unspecified: Secondary | ICD-10-CM

## 2022-10-07 DIAGNOSIS — R609 Edema, unspecified: Secondary | ICD-10-CM

## 2022-10-07 DIAGNOSIS — E785 Hyperlipidemia, unspecified: Secondary | ICD-10-CM

## 2022-10-07 DIAGNOSIS — N181 Chronic kidney disease, stage 1: Secondary | ICD-10-CM

## 2022-10-07 DIAGNOSIS — N182 Chronic kidney disease, stage 2 (mild): Secondary | ICD-10-CM

## 2022-10-07 DIAGNOSIS — E1122 Type 2 diabetes mellitus with diabetic chronic kidney disease: Secondary | ICD-10-CM

## 2022-10-07 NOTE — Telephone Encounter (Signed)
Patient returned RN Ann's call and scheduled lab visit tomorrow (9/18).  Patient wants call back regarding holding his Aspirin.

## 2022-10-07 NOTE — Telephone Encounter (Signed)
Left voicemail to return call to office

## 2022-10-08 ENCOUNTER — Ambulatory Visit: Payer: BLUE CROSS/BLUE SHIELD | Attending: Internal Medicine

## 2022-10-08 DIAGNOSIS — D649 Anemia, unspecified: Secondary | ICD-10-CM

## 2022-10-08 DIAGNOSIS — Z79899 Other long term (current) drug therapy: Secondary | ICD-10-CM

## 2022-10-08 LAB — IRON,TIBC AND FERRITIN PANEL
Ferritin: 878 ng/mL — ABNORMAL HIGH (ref 30–400)
Iron Saturation: 22 % (ref 15–55)
Iron: 50 ug/dL (ref 38–169)
Total Iron Binding Capacity: 225 ug/dL — ABNORMAL LOW (ref 250–450)
UIBC: 175 ug/dL (ref 111–343)

## 2022-10-08 NOTE — Addendum Note (Signed)
Addended by: Baird Lyons on: 10/08/2022 09:36 AM   Modules accepted: Orders

## 2022-10-09 DIAGNOSIS — Z6841 Body Mass Index (BMI) 40.0 and over, adult: Secondary | ICD-10-CM | POA: Diagnosis not present

## 2022-10-09 DIAGNOSIS — D649 Anemia, unspecified: Secondary | ICD-10-CM | POA: Diagnosis not present

## 2022-10-09 DIAGNOSIS — E118 Type 2 diabetes mellitus with unspecified complications: Secondary | ICD-10-CM | POA: Diagnosis not present

## 2022-10-09 DIAGNOSIS — I10 Essential (primary) hypertension: Secondary | ICD-10-CM | POA: Diagnosis not present

## 2022-10-09 LAB — CBC
Hematocrit: 26.6 % — ABNORMAL LOW (ref 37.5–51.0)
Hemoglobin: 8.7 g/dL — ABNORMAL LOW (ref 13.0–17.7)
MCH: 26.9 pg (ref 26.6–33.0)
MCHC: 32.7 g/dL (ref 31.5–35.7)
MCV: 82 fL (ref 79–97)
Platelets: 344 10*3/uL (ref 150–450)
RBC: 3.23 x10E6/uL — ABNORMAL LOW (ref 4.14–5.80)
RDW: 14.8 % (ref 11.6–15.4)
WBC: 6.2 10*3/uL (ref 3.4–10.8)

## 2022-10-10 ENCOUNTER — Other Ambulatory Visit: Payer: Self-pay

## 2022-10-10 ENCOUNTER — Telehealth: Payer: Self-pay | Admitting: Internal Medicine

## 2022-10-10 MED ORDER — AMLODIPINE BESYLATE 10 MG PO TABS: 10.0000 mg | ORAL_TABLET | Freq: Every day | ORAL | 3 refills | Status: DC

## 2022-10-10 MED ORDER — TELMISARTAN 80 MG PO TABS: 80.0000 mg | ORAL_TABLET | Freq: Every day | ORAL | 3 refills | Status: AC

## 2022-10-10 NOTE — Telephone Encounter (Signed)
Pt's PCP would like a callback from provider or nurse in regards to pt's BP (200/110) when in the office yesterday. Please advise

## 2022-10-10 NOTE — Telephone Encounter (Signed)
Spoke w/ NP that saw pt yesterday in routine follow up.   SBP was 200s w/ reduction to 180 while in office. She had pt take an extra Amlodipine yesterday. Pt told her his SBP at home was avg 140-150s.  She also drew some labs yesterday, his Ferritin is high and VIt D is low - she has sent this information to his urologist.  She is wondering if pt needs to resume Amlodipine 10 mg daily. Aware Dr. Tenny Craw is out of the office for the next several weeks.  Informed that her nurse will be here this afternoon and can follow up more on this matter and determine who to consult w/ as MD is out of the office (unsure if Dr. Tenny Craw is reachable vs discussing w/ DOD).  Boyd Kerbs, NP would like update on what decision is made.

## 2022-10-10 NOTE — Telephone Encounter (Signed)
I spoke with the DOD.. Dr. Lynnette Caffey and the pt will stop Losartan, start Micardis 80 mg and increase Amlodipine to 10 mg a day.   I attempted to call Boyd Kerbs back but held for over 15 min and had to hang up.. will try to route to her.   I left a message for th ept and sent him a my chart message since he seems to respond better this way.

## 2022-10-13 ENCOUNTER — Ambulatory Visit
Admission: RE | Admit: 2022-10-13 | Discharge: 2022-10-13 | Disposition: A | Discharge status: Home / Self Care | Payer: BLUE CROSS/BLUE SHIELD | Source: Ambulatory Visit | Attending: Nephrology | Admitting: Nephrology

## 2022-10-13 DIAGNOSIS — R609 Edema, unspecified: Secondary | ICD-10-CM

## 2022-10-13 DIAGNOSIS — N189 Chronic kidney disease, unspecified: Secondary | ICD-10-CM | POA: Diagnosis not present

## 2022-10-13 DIAGNOSIS — N181 Chronic kidney disease, stage 1: Secondary | ICD-10-CM

## 2022-10-13 DIAGNOSIS — R809 Proteinuria, unspecified: Secondary | ICD-10-CM

## 2022-10-13 DIAGNOSIS — E1122 Type 2 diabetes mellitus with diabetic chronic kidney disease: Secondary | ICD-10-CM

## 2022-10-13 DIAGNOSIS — E785 Hyperlipidemia, unspecified: Secondary | ICD-10-CM

## 2022-10-13 DIAGNOSIS — N182 Chronic kidney disease, stage 2 (mild): Secondary | ICD-10-CM

## 2022-10-13 DIAGNOSIS — E669 Obesity, unspecified: Secondary | ICD-10-CM

## 2022-10-16 DIAGNOSIS — I129 Hypertensive chronic kidney disease with stage 1 through stage 4 chronic kidney disease, or unspecified chronic kidney disease: Secondary | ICD-10-CM | POA: Diagnosis not present

## 2022-10-16 DIAGNOSIS — N182 Chronic kidney disease, stage 2 (mild): Secondary | ICD-10-CM | POA: Diagnosis not present

## 2022-10-16 DIAGNOSIS — E1122 Type 2 diabetes mellitus with diabetic chronic kidney disease: Secondary | ICD-10-CM | POA: Diagnosis not present

## 2022-10-20 DIAGNOSIS — E113513 Type 2 diabetes mellitus with proliferative diabetic retinopathy with macular edema, bilateral: Secondary | ICD-10-CM | POA: Diagnosis not present

## 2022-11-01 DIAGNOSIS — G4733 Obstructive sleep apnea (adult) (pediatric): Secondary | ICD-10-CM | POA: Diagnosis not present

## 2022-11-03 DIAGNOSIS — I129 Hypertensive chronic kidney disease with stage 1 through stage 4 chronic kidney disease, or unspecified chronic kidney disease: Secondary | ICD-10-CM | POA: Diagnosis not present

## 2022-11-04 ENCOUNTER — Telehealth: Payer: Self-pay

## 2022-11-04 NOTE — Telephone Encounter (Signed)
Appt made for the pt to see Dr Tenny Craw 11/28/22 at 8:40 am.

## 2022-11-04 NOTE — Telephone Encounter (Signed)
-----   Message from Dietrich Pates sent at 11/03/2022  9:38 PM EDT ----- Set follow up with me in Nov 2024 ----- Message ----- From: Bertram Millard, RN Sent: 11/03/2022   2:18 PM EDT To: Pricilla Riffle, MD  When should we see him back in the office.Marland Kitchengood thing nephrology is seeing him also. ----- Message ----- From: Royann Shivers Sent: 10/21/2022   9:45 AM EDT To: Pricilla Riffle, MD; Bertram Millard, RN

## 2022-11-17 DIAGNOSIS — I10 Essential (primary) hypertension: Secondary | ICD-10-CM | POA: Diagnosis not present

## 2022-11-17 DIAGNOSIS — R7989 Other specified abnormal findings of blood chemistry: Secondary | ICD-10-CM | POA: Diagnosis not present

## 2022-11-17 DIAGNOSIS — I129 Hypertensive chronic kidney disease with stage 1 through stage 4 chronic kidney disease, or unspecified chronic kidney disease: Secondary | ICD-10-CM | POA: Diagnosis not present

## 2022-11-17 DIAGNOSIS — N182 Chronic kidney disease, stage 2 (mild): Secondary | ICD-10-CM | POA: Diagnosis not present

## 2022-11-17 DIAGNOSIS — E1122 Type 2 diabetes mellitus with diabetic chronic kidney disease: Secondary | ICD-10-CM | POA: Diagnosis not present

## 2022-11-19 ENCOUNTER — Other Ambulatory Visit (INDEPENDENT_AMBULATORY_CARE_PROVIDER_SITE_OTHER): Payer: BLUE CROSS/BLUE SHIELD

## 2022-11-19 ENCOUNTER — Encounter: Payer: Self-pay | Admitting: Gastroenterology

## 2022-11-19 ENCOUNTER — Ambulatory Visit: Payer: BLUE CROSS/BLUE SHIELD | Admitting: Gastroenterology

## 2022-11-19 ENCOUNTER — Other Ambulatory Visit: Payer: Self-pay

## 2022-11-19 VITALS — BP 130/80 | HR 86 | Ht 74.0 in | Wt 322.0 lb

## 2022-11-19 DIAGNOSIS — D649 Anemia, unspecified: Secondary | ICD-10-CM

## 2022-11-19 DIAGNOSIS — K219 Gastro-esophageal reflux disease without esophagitis: Secondary | ICD-10-CM | POA: Diagnosis not present

## 2022-11-19 LAB — FOLATE: Folate: 6.8 ng/mL (ref >5.9)

## 2022-11-19 LAB — VITAMIN B12: Vitamin B-12: 328 pg/mL (ref 211–911)

## 2022-11-19 NOTE — Progress Notes (Signed)
Assessment     Normocytic anemia, does not appear to be GI related.  R/O UGI sources of blood loss  History of pancreatitis d/t lisinopril or bydureon GERD   Recommendations    B12, folate, tTG, IgA, stool Hemoccults Schedule EGD. The risks (including bleeding, perforation, infection, missed lesions, medication reactions and possible hospitalization or surgery if complications occur), benefits, and alternatives to endoscopy with possible biopsy and possible dilation were discussed with the patient and they consent to proceed.   Hematology appt on 11/16 to further evaluate anemia   HPI    This is a 47 year old male with anemia, normal iron studies, normal B12. Chronic GERD which well-controlled with infrequent breakthrough.  He has intermittent diarrhea felt secondary to metformin and food stressors. No other gastrointestinal complaints.  He has had problems with recurrent lower extremity and scrotal edema that has improved on diuretics.  Echocardiogram Aug 2024 showed EF 60 to 65%, moderately dilated right ventricle, mildly dilated left atrium.  Colonoscopy last year as below. Denies weight loss, abdominal pain, constipation, change in stool caliber, melena, hematochezia, nausea, vomiting, dysphagia, chest pain.   Colonoscopy Dec 2023 - Diverticulosis - Internal hemorrhoids   Labs / Imaging       Latest Ref Rng & Units 09/08/2022   11:20 AM 08/01/2022    3:55 PM 05/26/2022   10:17 PM  Hepatic Function  Total Protein 6.0 - 8.5 g/dL 6.3  6.6  7.2   Albumin 4.1 - 5.1 g/dL 3.6  3.4  3.7   AST 0 - 40 IU/L 30  29  32   ALT 0 - 44 IU/L 19  17  18    Alk Phosphatase 44 - 121 IU/L 99  89  88   Total Bilirubin 0.0 - 1.2 mg/dL 1.6  1.8  1.8        Latest Ref Rng & Units 10/08/2022    9:28 AM 10/03/2022    9:09 AM 09/08/2022   11:20 AM  CBC  WBC 3.4 - 10.8 x10E3/uL 6.2  5.9  5.4   Hemoglobin 13.0 - 17.7 g/dL 8.7  8.4  9.6   Hematocrit 37.5 - 51.0 % 26.6  25.7  28.4   Platelets 150  - 450 x10E3/uL 344  314  316      US RENAL CLINICAL DATA:  Chronic renal disease  EXAM: RENAL / URINARY TRACT ULTRASOUND COMPLETE  COMPARISON:  CT abdomen pelvis 08/01/2022  FINDINGS: Right Kidney:  Renal measurements: 12.1 x 5.3 x 5.9 cm = volume: 197.2 mL. Echogenicity within normal limits. No mass or hydronephrosis visualized.  Left Kidney:  Renal measurements: 13.1 x 7.4 x 5.6 cm = volume: 283.7 mL. Echogenicity within normal limits. No mass or hydronephrosis visualized.  Bladder:  Appears normal for degree of bladder distention.  Other:  Left pleural effusion.  IMPRESSION: 1. No hydronephrosis. 2. Left pleural effusion.  Electronically Signed   By: Annia Belt M.D.   On: 10/13/2022 13:34   Current Medications, Allergies, Past Medical History, Past Surgical History, Family History and Social History were reviewed in [employer] record.   Physical Exam: General: Well developed, well nourished, no acute distress Head: Normocephalic and atraumatic Eyes: Sclerae anicteric, EOMI Ears: Normal auditory acuity Mouth: No deformities or lesions noted Lungs: Clear throughout to auscultation Heart: Regular rate and rhythm; No murmurs, rubs or bruits Abdomen: Soft, non tender and non distended. No masses, hepatosplenomegaly or hernias noted. Normal Bowel sounds Rectal: Not done  Musculoskeletal: Symmetrical with no gross deformities  Pulses:  Normal pulses noted Extremities: No edema or deformities noted Neurological: Alert oriented x 4, grossly nonfocal Psychological:  Alert and cooperative. Normal mood and affect   Amina Menchaca T. Russella Dar, MD 11/19/2022, 10:28 AM

## 2022-11-19 NOTE — Patient Instructions (Signed)
Your provider has requested that you go to the basement level for lab work before leaving today. Press "B" on the elevator. The lab is located at the first door on the left as you exit the elevator.  You have been scheduled for an endoscopy. Please follow written instructions given to you at your visit today.  If you use inhalers (even only as needed), please bring them with you on the day of your procedure.  If you take any of the following medications, they will need to be adjusted prior to your procedure:   DO NOT TAKE 7 DAYS PRIOR TO TEST- Trulicity (dulaglutide) Ozempic, Wegovy (semaglutide) Mounjaro (tirzepatide) Bydureon Bcise (exanatide extended release)  DO NOT TAKE 1 DAY PRIOR TO YOUR TEST Rybelsus (semaglutide) Adlyxin (lixisenatide) Victoza (liraglutide) Byetta (exanatide) ___________________________________________________________________________  The [place] [facility] providers would like to encourage you to use [facility] to communicate with providers for non-urgent requests or questions.  Due to long hold times on the telephone, sending your provider a message by [facility] may be a faster and more efficient way to get a response.  Please allow 48 business hours for a response.  Please remember that this is for non-urgent requests.   Due to recent changes in healthcare laws, you may see the results of your imaging and laboratory studies on MyChart before your provider has had a chance to review them.  We understand that in some cases there may be results that are confusing or concerning to you. Not all laboratory results come back in the same time frame and the provider may be waiting for multiple results in order to interpret others.  Please give [place] 48 hours in order for your provider to thoroughly review all the results before contacting the office for clarification of your results.   Thank you for choosing me and  Gastroenterology.  Venita Lick. Pleas Koch., MD., Clementeen Graham

## 2022-11-20 LAB — TISSUE TRANSGLUTAMINASE, IGA: (tTG) Ab, IgA: <1 U/mL

## 2022-11-20 LAB — IGA: Immunoglobulin A: 289 mg/dL (ref 47–310)

## 2022-11-27 NOTE — Progress Notes (Signed)
Cardiology Office Note   Date:  12/04/2022   ID:  Gabriel Waters, DOB [birth year], MRN [id number]  PCP:  [name], NP  Cardiologist:   Dietrich Pates, MD   Patient presents for follow up of HTN and edema       History of Present Illness: Gabriel Waters is a 47 y.o. male with a history ofType II DM (Dx 10 years ago), HTN, obesity   I saw the pt for the first time in Jan 2024    Increased losartan to 100 and amlodipine 10 mg  Echo done  LVEF and RVEF were normal   Normal valve function Seen in [facility] ([facility]) in Feb Set  up for CPAP for OSA     April 2024-  PT seen at [facility] for HTN   Called in with readings  Over time  Maxzide added to regimen (37/5/25)  Hydralazine increased to 50 and then 100 tid   Spironolactone also added to regimen at 25 mg  July 2024  Pt seen in  ER  for anasarca  CT of abdomen done   Sent home with course of Lasix    Sep 08, 2022 I saw the pt in clinic  He was markely volume overloaded   I recomm pt stop  hydralazine and starting lasix chronically  Echo on 09/09/22 showed LVEF and RVEF were normal   Sept 2024-  I saw the pt in clinic   He had diuresed signficantly since Aug but still had LE and scrotal edema Feeling much better  BP 128/    Breathing OK   Recomm he cut back on amlodipine to 5 mg daily t osee if helped with edema  Sept 20 2024  Had seen PCP the day prior   BP 200/  Told to take an extra amlodipine   DOD(A Lynnette Caffey) recomm stop losartan, start Micardis and increase amlodipine to 10 mg    The pt presents on return today  Says his BP is still high  Still with swelling in legs and scrotum Denies CP     Pt says when he lays flat he will hear his heart beat in ear  This is new Not at other times   Current Meds  Medication Sig   Accu-Chek Softclix Lancets lancets Check blood sugar three times a day. 30 day supply. No refills   Continuous Blood Gluc Sensor (FREESTYLE LIBRE 3 SENSOR) MISC Place 1 sensor on the skin every 14 days. Use  to check glucose continuously. 30 day supply. No refills   fluticasone (FLONASE ALLERGY RELIEF) 50 MCG/ACT nasal spray 1 spray daily as needed.   furosemide (LASIX) 40 MG tablet Take 1 tablet (40 mg total) by mouth daily.   insulin aspart (NOVOLOG FLEXPEN) 100 UNIT/ML FlexPen Sliding scale   Insulin Pen Needle (PEN NEEDLES 3/16") 31G X 5 MM MISC Dispense enough pen needles for tresiba 35 units daily, novolog 8 units TID w/ meals x 30 days. No refills. (Patient taking differently: Dispense enough pen needles for tresiba 26 units daily, novolog 8 units TID w/ meals x 30 days. No refills.)   Lancet Devices (SIMPLE DIAGNOSTICS LANCING DEV) MISC 1 each by Misc.(Non-Drug; Combo Route) route 2 times daily.   metFORMIN (GLUCOPHAGE) 1000 MG tablet Take 1,000 mg by mouth 2 (two) times daily.   Multiple Vitamins-Minerals (ALIVE MULTI-VITAMIN PO) Take by mouth.   potassium chloride (KLOR-CON) 10 MEQ tablet Take 1 tablet (10 mEq total) by mouth daily.  rosuvastatin (CRESTOR) 20 MG tablet Take 1 tablet (20 mg total) by mouth daily.   telmisartan (MICARDIS) 80 MG tablet Take 1 tablet (80 mg total) by mouth daily.   TRESIBA FLEXTOUCH 100 UNIT/ML FlexTouch Pen 32 units daily   [DISCONTINUED] amLODipine (NORVASC) 10 MG tablet Take 1 tablet (10 mg total) by mouth daily.   [DISCONTINUED] spironolactone (ALDACTONE) 25 MG tablet Take 1 tablet (25 mg total) by mouth daily.     Allergies:   Hydralazine, Exenatide, Invokana [canagliflozin], Lipitor [atorvastatin], and Lisinopril   Past Medical History:  Diagnosis Date   Acute pancreatitis 09/13/2013   Allergy    SEASONAL   Anxiety    Arthritis    RIGHT SHOULDER   Depression    DIABETES MELLITUS, TYPE II, UNCONTROLLED 01/02/2010   ELEVATED BLOOD PRESSURE 12/31/2009   GERD (gastroesophageal reflux disease)    Hyperlipidemia    Hypertension    Neuropathy    FEET BILATERAL   Pancreatitis 09/13/2013   Sleep apnea     Past Surgical History:  Procedure  Laterality Date   CHOLECYSTECTOMY, LAPAROSCOPIC  03/15/2019   COLONOSCOPY  12/23/2021   WISDOM TOOTH EXTRACTION       Social History:  The patient  reports that he has never smoked. He has been exposed to tobacco smoke. He has never used smokeless tobacco. He reports current alcohol use. He reports that he does not use drugs.   Family History:  The patient's family history includes Allergies in his mother; Colon polyps in his mother and sister; Diabetes in his father; Emphysema in his father; Heart disease in his father and mother; Sarcoidosis in his father.    ROS:  Please see the history of present illness. All other systems are reviewed and  Negative to the above problem except as noted.    PHYSICAL EXAM: VS:  BP (!) 190/86   Pulse 91   Ht 6\' 2"  (1.88 m)   Wt (!) 313 lb 12.8 oz (142.3 kg)   SpO2 98%   BMI 40.29 kg/m   GEN: Morbidly obese 47 yo  in no acute distress  HEENT: normal  Neck: JVP not elevated Cardiac: RRR;  No murmur    Respiratory:  clear to auscultation GI: Obese   Nontender  No hepatomegaly Ext  1+ edema to knee  EKG:  EKG is not  ordered today.  Echo   09/09/22   1. Left ventricular ejection fraction, by estimation, is 60 to 65%. The  left ventricle has normal function. The left ventricle has no regional  wall motion abnormalities. There is severe concentric left ventricular  hypertrophy. Left ventricular diastolic   parameters are indeterminate.   2. Right ventricular systolic function is normal. The right ventricular  size is moderately enlarged.   3. Left atrial size was mildly dilated.   4. The mitral valve is normal in structure. Mild mitral valve  regurgitation. No evidence of mitral stenosis.   5. The aortic valve is normal in structure. Aortic valve regurgitation is  trivial. Aortic valve sclerosis is present, with no evidence of aortic  valve stenosis.   6. The inferior vena cava is dilated in size with >50% respiratory  variability,  suggesting right atrial pressure of 8 mmHg.   Lipid Panel    Component Value Date/Time   CHOL 136 05/10/2018 0841   TRIG 79.0 05/10/2018 0841   HDL 37.70 (L) 05/10/2018 0841   CHOLHDL 4 05/10/2018 0841   VLDL 15.8 05/10/2018 0841   LDLCALC  83 05/10/2018 0841   LDLDIRECT 129.0 03/31/2017 1542      Wt Readings from Last 3 Encounters:  12/02/22 (!) 322 lb (146.1 kg)  11/28/22 (!) 313 lb 12.8 oz (142.3 kg)  11/19/22 (!) 322 lb (146.1 kg)      ASSESSMENT AND PLAN:  1  HTN   PT with severe HTN today  Confusing  He was better before  ? Compliance        Will get labs before changing meds   2  Anasarca  Improved from this summer (when on hydralazine)   Still present   Continue lasix 40     3  Anemia   Hgb 8.7 in September INdices normal   Ferr high   Followed by PCP   3  OSA  Using CPAP   4  DM   A1C 5.2 in July   IMproved from previous     3  HL  LDL 89 in April 2024  Continue Crestor    Current medicines are reviewed at length with the patient today.  The patient does not have concerns regarding medicines.  Signed, Dietrich Pates, MD  12/04/2022 2:25 PM    [facility] [address] [place], [place], [place]  [address] Phone: [phone]; Fax: [phone]

## 2022-11-28 ENCOUNTER — Encounter: Payer: Self-pay | Admitting: Internal Medicine

## 2022-11-28 ENCOUNTER — Ambulatory Visit: Payer: BLUE CROSS/BLUE SHIELD | Attending: Internal Medicine | Admitting: Internal Medicine

## 2022-11-28 VITALS — BP 190/86 | HR 91 | Ht 74.0 in | Wt 313.8 lb

## 2022-11-28 DIAGNOSIS — I10 Essential (primary) hypertension: Secondary | ICD-10-CM

## 2022-11-28 DIAGNOSIS — Z79899 Other long term (current) drug therapy: Secondary | ICD-10-CM

## 2022-11-28 DIAGNOSIS — E8770 Fluid overload, unspecified: Secondary | ICD-10-CM | POA: Diagnosis not present

## 2022-11-28 MED ORDER — AMLODIPINE BESYLATE 10 MG PO TABS: 10.0000 mg | ORAL_TABLET | Freq: Every day | ORAL | 3 refills | Status: AC

## 2022-11-28 NOTE — Patient Instructions (Signed)
Medication Instructions:   *If you need a refill on your cardiac medications before your next appointment, please call your pharmacy*   Lab Work: Cmet, pro bnp, esr, tsh  If you have labs (blood work) drawn today and your tests are completely normal, you will receive your results only by: MyChart Message (if you have MyChart) OR A paper copy in the mail If you have any lab test that is abnormal or we need to change your treatment, we will call you to review the results.   Testing/Procedures:    Follow-Up: At [facility], you and your health needs are our priority.  As part of our continuing mission to provide you with exceptional heart care, we have created designated Provider Care Teams.  These Care Teams include your primary Cardiologist (physician) and Advanced Practice Providers (APPs -  Physician Assistants and Nurse Practitioners) who all work together to provide you with the care you need, when you need it.  We recommend signing up for the patient portal called "MyChart".  Sign up information is provided on this After Visit Summary.  MyChart is used to connect with patients for Virtual Visits (Telemedicine).  Patients are able to view lab/test results, encounter notes, upcoming appointments, etc.  Non-urgent messages can be sent to your provider as well.   To learn more about what you can do with MyChart, go to [email or web address].

## 2022-12-02 ENCOUNTER — Ambulatory Visit: Payer: BLUE CROSS/BLUE SHIELD | Admitting: Gastroenterology

## 2022-12-02 ENCOUNTER — Other Ambulatory Visit: Payer: Self-pay | Admitting: Gastroenterology

## 2022-12-02 ENCOUNTER — Encounter: Payer: Self-pay | Admitting: Gastroenterology

## 2022-12-02 VITALS — BP 150/80 | HR 75 | Temp 97.4°F | Resp 20 | Ht 74.0 in | Wt 322.0 lb

## 2022-12-02 DIAGNOSIS — K222 Esophageal obstruction: Secondary | ICD-10-CM | POA: Diagnosis not present

## 2022-12-02 DIAGNOSIS — D649 Anemia, unspecified: Secondary | ICD-10-CM | POA: Diagnosis not present

## 2022-12-02 DIAGNOSIS — K2100 Gastro-esophageal reflux disease with esophagitis, without bleeding: Secondary | ICD-10-CM

## 2022-12-02 DIAGNOSIS — K317 Polyp of stomach and duodenum: Secondary | ICD-10-CM | POA: Diagnosis not present

## 2022-12-02 DIAGNOSIS — K219 Gastro-esophageal reflux disease without esophagitis: Secondary | ICD-10-CM

## 2022-12-02 DIAGNOSIS — G4733 Obstructive sleep apnea (adult) (pediatric): Secondary | ICD-10-CM | POA: Diagnosis not present

## 2022-12-02 LAB — COMPREHENSIVE METABOLIC PANEL
ALT: 21 IU/L (ref 0–44)
AST: 28 IU/L (ref 0–40)
Albumin: 3.7 g/dL — ABNORMAL LOW (ref 4.1–5.1)
Alkaline Phosphatase: 120 IU/L (ref 44–121)
BUN/Creatinine Ratio: 19 (ref 9–20)
BUN: 26 mg/dL — ABNORMAL HIGH (ref 6–24)
Bilirubin Total: 1.1 mg/dL (ref 0.0–1.2)
CO2: 25 mmol/L (ref 20–29)
Calcium: 8.5 mg/dL — ABNORMAL LOW (ref 8.7–10.2)
Chloride: 103 mmol/L (ref 96–106)
Creatinine, Ser: 1.37 mg/dL — ABNORMAL HIGH (ref 0.76–1.27)
Globulin, Total: 2.6 g/dL (ref 1.5–4.5)
Glucose: 135 mg/dL — ABNORMAL HIGH (ref 70–99)
Potassium: 4.1 mmol/L (ref 3.5–5.2)
Sodium: 141 mmol/L (ref 134–144)
Total Protein: 6.3 g/dL (ref 6.0–8.5)
eGFR: 64 mL/min/{1.73_m2} (ref >59)

## 2022-12-02 LAB — TSH: TSH: 1.67 u[IU]/mL (ref 0.450–4.500)

## 2022-12-02 LAB — PRO B NATRIURETIC PEPTIDE: NT-Pro BNP: 2328 pg/mL — ABNORMAL HIGH (ref 0–121)

## 2022-12-02 LAB — SEDIMENTATION RATE: Sed Rate: 36 mm/h — ABNORMAL HIGH (ref 0–15)

## 2022-12-02 MED ORDER — PANTOPRAZOLE SODIUM 40 MG PO TBEC: 40.0000 mg | DELAYED_RELEASE_TABLET | Freq: Every day | ORAL | 3 refills | Status: AC

## 2022-12-02 MED ORDER — SODIUM CHLORIDE 0.9 % IV SOLN: 500.0000 mL | Freq: Once | INTRAVENOUS | Status: DC

## 2022-12-02 NOTE — Progress Notes (Signed)
Sedate, gd SR, tolerated procedure well, VSS, report to RN 

## 2022-12-02 NOTE — Progress Notes (Signed)
Pt's states no medical or surgical changes since previsit or office visit. 

## 2022-12-02 NOTE — Patient Instructions (Addendum)
-   Resume previous diet. - Follow antireflux measures. - Continue present medications. - Pantoprazole 40 mg po qd, 1 year of refills. - Await pathology results.  YOU HAD AN ENDOSCOPIC PROCEDURE TODAY AT THE [facility]:   Refer to the procedure report that was given to you for any specific questions about what was found during the examination.  If the procedure report does not answer your questions, please call your gastroenterologist to clarify.  If you requested that your care partner not be given the details of your procedure findings, then the procedure report has been included in a sealed envelope for you to review at your convenience later.  YOU SHOULD EXPECT: Some feelings of bloating in the abdomen. Passage of more gas than usual.  Walking can help get rid of the air that was put into your GI tract during the procedure and reduce the bloating. If you had a lower endoscopy (such as a colonoscopy or flexible sigmoidoscopy) you may notice spotting of blood in your stool or on the toilet paper. If you underwent a bowel prep for your procedure, you may not have a normal bowel movement for a few days.  Please Note:  You might notice some irritation and congestion in your nose or some drainage.  This is from the oxygen used during your procedure.  There is no need for concern and it should clear up in a day or so.  SYMPTOMS TO REPORT IMMEDIATELY:   Following upper endoscopy (EGD)  Vomiting of blood or coffee ground material  New chest pain or pain under the shoulder blades  Painful or persistently difficult swallowing  New shortness of breath  Fever of 100F or higher  Black, tarry-looking stools  For urgent or emergent issues, a gastroenterologist can be reached at any hour by calling [phone]. Do not use MyChart messaging for urgent concerns.    DIET:  We do recommend a small meal at first, but then you may proceed to your regular diet.  Drink plenty of fluids but you  should avoid alcoholic beverages for 24 hours.  ACTIVITY:  You should plan to take it easy for the rest of today and you should NOT DRIVE or use heavy machinery until tomorrow (because of the sedation medicines used during the test).    FOLLOW UP: Our staff will call the number listed on your records the next business day following your procedure.  We will call around 7:15- 8:00 am to check on you and address any questions or concerns that you may have regarding the information given to you following your procedure. If we do not reach you, we will leave a message.     If any biopsies were taken you will be contacted by phone or by letter within the next 1-3 weeks.  Please call us at [phone] if you have not heard about the biopsies in 3 weeks.    SIGNATURES/CONFIDENTIALITY: You and/or your care partner have signed paperwork which will be entered into your electronic medical record.  These signatures attest to the fact that that the information above on your After Visit Summary has been reviewed and is understood.  Full responsibility of the confidentiality of this discharge information lies with you and/or your care-partner.

## 2022-12-02 NOTE — Op Note (Signed)
[facility] Patient Name: Gabriel Waters Procedure Date: 12/02/2022 9:43 AM MRN: [id number] Endoscopist: Meryl Dare , MD, [id number] Age: 47 Referring MD:  Date of Birth: [birth year] Gender: Male Account #: [id number] Procedure:                Upper GI endoscopy Indications:              Anemia, Gastroesophageal reflux disease Medicines:                Monitored Anesthesia Care Procedure:                Pre-Anesthesia Assessment:                           - Prior to the procedure, a History and Physical                            was performed, and patient medications and                            allergies were reviewed. The patient's tolerance of                            previous anesthesia was also reviewed. The risks                            and benefits of the procedure and the sedation                            options and risks were discussed with the patient.                            All questions were answered, and informed consent                            was obtained. Prior Anticoagulants: The patient has                            taken no anticoagulant or antiplatelet agents. ASA                            Grade Assessment: II - A patient with mild systemic                            disease. After reviewing the risks and benefits,                            the patient was deemed in satisfactory condition to                            undergo the procedure.                           After obtaining informed consent, the endoscope was  passed under direct vision. Throughout the                            procedure, the patient's blood pressure, pulse, and                            oxygen saturations were monitored continuously. The                            Olympus Scope SN [id number] was introduced through the                            mouth, and advanced to the second part of duodenum.                            The  upper GI endoscopy was accomplished without                            difficulty. The patient tolerated the procedure                            well. Scope In: Scope Out: Findings:                 LA Grade A (one or more mucosal breaks less than 5                            mm, not extending between tops of 2 mucosal folds)                            esophagitis with no bleeding was found in the                            distal esophagus.                           One benign-appearing, intrinsic mild stenosis was                            found at the gastroesophageal junction. This                            stenosis measured 1.4 cm (inner diameter) x less                            than one cm (in length). The stenosis was traversed.                           The exam of the esophagus was otherwise normal.                           A small hiatal hernia was present.  The gastroesophageal flap valve was visualized                            endoscopically and classified as Hill Grade II                            (fold present, opens with respiration).                           A single 6 mm sessile polyp with no bleeding and no                            stigmata of recent bleeding was found on the                            greater curvature of the stomach. The polyp was                            removed with a cold snare. Resection and retrieval                            were complete.                           The exam of the stomach was otherwise normal.                           The duodenal bulb and second portion of the                            duodenum were normal. Complications:            No immediate complications. Estimated Blood Loss:     Estimated blood loss was minimal. Impression:               - LA Grade A reflux esophagitis with no bleeding.                           - Benign-appearing non-obstructing esophageal                             stenosis.                           - Small hiatal hernia.                           - Gastroesophageal flap valve classified as Hill                            Grade II (fold present, opens with respiration).                           - A single gastric polyp. Resected and retrieved.                           -  Normal duodenal bulb and second portion of the                            duodenum. Recommendation:           - Patient has a contact number available for                            emergencies. The signs and symptoms of potential                            delayed complications were discussed with the                            patient. Return to normal activities tomorrow.                            Written discharge instructions were provided to the                            patient.                           - Resume previous diet.                           - Follow antireflux measures.                           - Continue present medications.                           - Pantoprazole 40 mg po qd, 1 year of refills.                           - Await pathology results. Meryl Dare, MD 12/02/2022 10:05:47 AM This report has been signed electronically.

## 2022-12-02 NOTE — Progress Notes (Signed)
Called to room to assist during endoscopic procedure.  Patient ID and intended procedure confirmed with present staff. Received instructions for my participation in the procedure from the performing physician.  

## 2022-12-02 NOTE — Progress Notes (Signed)
See 11/19/2022 H&P no changes

## 2022-12-03 ENCOUNTER — Other Ambulatory Visit (HOSPITAL_COMMUNITY): Payer: Self-pay

## 2022-12-03 ENCOUNTER — Telehealth: Payer: Self-pay | Admitting: Pharmacy Technician

## 2022-12-03 ENCOUNTER — Telehealth: Payer: Self-pay

## 2022-12-03 DIAGNOSIS — N182 Chronic kidney disease, stage 2 (mild): Secondary | ICD-10-CM | POA: Diagnosis not present

## 2022-12-03 DIAGNOSIS — I129 Hypertensive chronic kidney disease with stage 1 through stage 4 chronic kidney disease, or unspecified chronic kidney disease: Secondary | ICD-10-CM | POA: Diagnosis not present

## 2022-12-03 NOTE — Telephone Encounter (Signed)
Pharmacy Patient Advocate Encounter   Received notification from Pt Calls Messages that prior authorization for LANSOPRAZOLE 30MG  is required/requested.   Insurance verification completed.   The patient is insured through [employer] .   Per test claim:  PA not needed. Test billing results return a copay of $71.27

## 2022-12-03 NOTE — Telephone Encounter (Signed)
PA is not needed. Pt does have two different insurances. One of the insurance does not cover because it is available over the counter and the other it does cover with a $71 copay.

## 2022-12-03 NOTE — Telephone Encounter (Signed)
Left message on follow up call. 

## 2022-12-04 LAB — SURGICAL PATHOLOGY

## 2022-12-05 ENCOUNTER — Telehealth: Payer: Self-pay | Admitting: Internal Medicine

## 2022-12-05 ENCOUNTER — Other Ambulatory Visit: Payer: Self-pay | Admitting: Medical Oncology

## 2022-12-05 ENCOUNTER — Other Ambulatory Visit: Payer: Self-pay | Admitting: Internal Medicine

## 2022-12-05 DIAGNOSIS — N179 Acute kidney failure, unspecified: Secondary | ICD-10-CM

## 2022-12-05 DIAGNOSIS — D649 Anemia, unspecified: Secondary | ICD-10-CM

## 2022-12-05 NOTE — Telephone Encounter (Signed)
Called to inform the patient that we will need labs before he sees Dr.Mohamed. Patient was asked to arrive at 8 instead of 8:30. Left VM with appointment details.

## 2022-12-06 ENCOUNTER — Other Ambulatory Visit: Payer: Self-pay | Admitting: Internal Medicine

## 2022-12-06 ENCOUNTER — Inpatient Hospital Stay: Payer: BLUE CROSS/BLUE SHIELD

## 2022-12-06 ENCOUNTER — Inpatient Hospital Stay: Payer: BLUE CROSS/BLUE SHIELD | Attending: Hematology | Admitting: Internal Medicine

## 2022-12-06 VITALS — BP 187/85 | HR 88 | Temp 97.6°F | Resp 20 | Wt 297.1 lb

## 2022-12-06 DIAGNOSIS — K2100 Gastro-esophageal reflux disease with esophagitis, without bleeding: Secondary | ICD-10-CM | POA: Insufficient documentation

## 2022-12-06 DIAGNOSIS — E1122 Type 2 diabetes mellitus with diabetic chronic kidney disease: Secondary | ICD-10-CM

## 2022-12-06 DIAGNOSIS — Z83719 Family history of colon polyps, unspecified: Secondary | ICD-10-CM | POA: Diagnosis not present

## 2022-12-06 DIAGNOSIS — D649 Anemia, unspecified: Secondary | ICD-10-CM

## 2022-12-06 DIAGNOSIS — J302 Other seasonal allergic rhinitis: Secondary | ICD-10-CM | POA: Diagnosis not present

## 2022-12-06 DIAGNOSIS — Z833 Family history of diabetes mellitus: Secondary | ICD-10-CM | POA: Diagnosis not present

## 2022-12-06 DIAGNOSIS — Z888 Allergy status to other drugs, medicaments and biological substances status: Secondary | ICD-10-CM | POA: Diagnosis not present

## 2022-12-06 DIAGNOSIS — E7800 Pure hypercholesterolemia, unspecified: Secondary | ICD-10-CM | POA: Insufficient documentation

## 2022-12-06 DIAGNOSIS — M1990 Unspecified osteoarthritis, unspecified site: Secondary | ICD-10-CM

## 2022-12-06 DIAGNOSIS — F32A Depression, unspecified: Secondary | ICD-10-CM | POA: Insufficient documentation

## 2022-12-06 DIAGNOSIS — Z825 Family history of asthma and other chronic lower respiratory diseases: Secondary | ICD-10-CM

## 2022-12-06 DIAGNOSIS — N179 Acute kidney failure, unspecified: Secondary | ICD-10-CM

## 2022-12-06 DIAGNOSIS — K317 Polyp of stomach and duodenum: Secondary | ICD-10-CM | POA: Diagnosis not present

## 2022-12-06 DIAGNOSIS — G4730 Sleep apnea, unspecified: Secondary | ICD-10-CM | POA: Insufficient documentation

## 2022-12-06 DIAGNOSIS — K219 Gastro-esophageal reflux disease without esophagitis: Secondary | ICD-10-CM | POA: Diagnosis not present

## 2022-12-06 DIAGNOSIS — K859 Acute pancreatitis without necrosis or infection, unspecified: Secondary | ICD-10-CM | POA: Diagnosis not present

## 2022-12-06 DIAGNOSIS — E785 Hyperlipidemia, unspecified: Secondary | ICD-10-CM

## 2022-12-06 DIAGNOSIS — Z9049 Acquired absence of other specified parts of digestive tract: Secondary | ICD-10-CM | POA: Insufficient documentation

## 2022-12-06 DIAGNOSIS — D472 Monoclonal gammopathy: Secondary | ICD-10-CM

## 2022-12-06 DIAGNOSIS — R600 Localized edema: Secondary | ICD-10-CM | POA: Diagnosis not present

## 2022-12-06 DIAGNOSIS — Z8249 Family history of ischemic heart disease and other diseases of the circulatory system: Secondary | ICD-10-CM | POA: Diagnosis not present

## 2022-12-06 DIAGNOSIS — Z8719 Personal history of other diseases of the digestive system: Secondary | ICD-10-CM | POA: Diagnosis not present

## 2022-12-06 DIAGNOSIS — G629 Polyneuropathy, unspecified: Secondary | ICD-10-CM

## 2022-12-06 DIAGNOSIS — N189 Chronic kidney disease, unspecified: Secondary | ICD-10-CM | POA: Diagnosis not present

## 2022-12-06 DIAGNOSIS — I129 Hypertensive chronic kidney disease with stage 1 through stage 4 chronic kidney disease, or unspecified chronic kidney disease: Secondary | ICD-10-CM | POA: Diagnosis not present

## 2022-12-06 DIAGNOSIS — Z79899 Other long term (current) drug therapy: Secondary | ICD-10-CM

## 2022-12-06 LAB — FERRITIN: Ferritin: 554 ng/mL — ABNORMAL HIGH (ref 24–336)

## 2022-12-06 LAB — CMP (CANCER CENTER ONLY)
ALT: 24 U/L (ref 0–44)
AST: 29 U/L (ref 15–41)
Albumin: 3.8 g/dL (ref 3.5–5.0)
Alkaline Phosphatase: 99 U/L (ref 38–126)
Anion gap: 6 (ref 5–15)
BUN: 29 mg/dL — ABNORMAL HIGH (ref 6–20)
CO2: 30 mmol/L (ref 22–32)
Calcium: 8.9 mg/dL (ref 8.9–10.3)
Chloride: 104 mmol/L (ref 98–111)
Creatinine: 1.6 mg/dL — ABNORMAL HIGH (ref 0.61–1.24)
GFR, Estimated: 53 mL/min — ABNORMAL LOW (ref >60)
Glucose, Bld: 115 mg/dL — ABNORMAL HIGH (ref 70–99)
Potassium: 3.7 mmol/L (ref 3.5–5.1)
Sodium: 140 mmol/L (ref 135–145)
Total Bilirubin: 1.7 mg/dL — ABNORMAL HIGH (ref <1.2)
Total Protein: 7.2 g/dL (ref 6.5–8.1)

## 2022-12-06 LAB — CBC WITH DIFFERENTIAL (CANCER CENTER ONLY)
Abs Immature Granulocytes: 0.01 10*3/uL (ref 0.00–0.07)
Basophils Absolute: 0 10*3/uL (ref 0.0–0.1)
Basophils Relative: 1 %
Eosinophils Absolute: 0.2 10*3/uL (ref 0.0–0.5)
Eosinophils Relative: 3 %
HCT: 28.8 % — ABNORMAL LOW (ref 39.0–52.0)
Hemoglobin: 9.2 g/dL — ABNORMAL LOW (ref 13.0–17.0)
Immature Granulocytes: 0 %
Lymphocytes Relative: 24 %
Lymphs Abs: 1.3 10*3/uL (ref 0.7–4.0)
MCH: 25.8 pg — ABNORMAL LOW (ref 26.0–34.0)
MCHC: 31.9 g/dL (ref 30.0–36.0)
MCV: 80.9 fL (ref 80.0–100.0)
Monocytes Absolute: 0.5 10*3/uL (ref 0.1–1.0)
Monocytes Relative: 9 %
Neutro Abs: 3.3 10*3/uL (ref 1.7–7.7)
Neutrophils Relative %: 63 %
Platelet Count: 290 10*3/uL (ref 150–400)
RBC: 3.56 MIL/uL — ABNORMAL LOW (ref 4.22–5.81)
RDW: 16 % — ABNORMAL HIGH (ref 11.5–15.5)
WBC Count: 5.3 10*3/uL (ref 4.0–10.5)
nRBC: 0 % (ref 0.0–0.2)

## 2022-12-06 LAB — LACTATE DEHYDROGENASE: LDH: 225 U/L — ABNORMAL HIGH (ref 98–192)

## 2022-12-06 LAB — IRON AND IRON BINDING CAPACITY (CC-WL,HP ONLY)
Iron: 49 ug/dL (ref 45–182)
Saturation Ratios: 16 % — ABNORMAL LOW (ref 17.9–39.5)
TIBC: 312 ug/dL (ref 250–450)
UIBC: 263 ug/dL (ref 117–376)

## 2022-12-06 LAB — VITAMIN B12: Vitamin B-12: 422 pg/mL (ref 180–914)

## 2022-12-06 LAB — FOLATE: Folate: 6 ng/mL (ref >5.9)

## 2022-12-06 NOTE — Progress Notes (Signed)
[facility] Telephone:([phone]) [phone]   Fax:([phone]) [phone]  CONSULT NOTE  REFERRING PHYSICIAN:Penny Yetta Barre, NP  REASON FOR CONSULTATION:  47 years old male with elevated serum light chain  HPI Gabriel Waters is a 47 y.o. male with past medical history significant for multiple medical problems including history of acute pancreatitis, seasonal allergy, and anxiety, osteoarthritis, hypertension, dyslipidemia, GERD, diabetes mellitus, chronic kidney disease as well as peripheral neuropathy, depression and sleep apnea.  He was seen by nephrology recently for evaluation of his chronic kidney disease and during his blood work he had serum light chain performed that showed elevated free kappa light chain of 52.6 with free lambda light chain of 26.1 and a kappa/lambda ratio of 2.02.  His serum protein electrophoresis showed no M spike with no evidence of monoclonal gammopathy.  He was referred to me today for evaluation and recommendation regarding his condition.  He had upper endoscopy performed on December 02, 2022 by Dr. Russella Dar and that showed low-grade a reflux esophagitis with no bleeding and benign-appearing nonobstructing esophageal stenosis with a small hiatal hernia.  There was also small gastric polyps that was removed. Discussed the use of AI scribe software for clinical note transcription with the patient, who gave verbal consent to proceed.  History of Present Illness   Gabriel Waters, a 47 year old patient with a history of hypertension, high cholesterol, diabetes, pancreatitis, depression, neuropathy, sleep apnea, GERD, arthritis, seasonal allergy, anemia, and chronic kidney disease, was referred for evaluation due to elevated protein levels in the blood detected by his nephrologist. The patient initially sought medical attention due to swelling in the legs and lower region, which led to the discovery of kidney flow issues. Despite changes in medication and ongoing monitoring,  the kidney function continues to decline, albeit within the expected range for the patient's age.  In addition to kidney issues, the patient was found to have anemia. The cause of the anemia remains unclear, as the level of anemia does not correspond with the kidney function. The patient underwent a colonoscopy, which revealed esophagitis and gastric polyps, but no explanation for the anemia.  The patient reports variable energy levels, with some days marked by fatigue and others by normal energy. Swelling in the legs persists and tends to increase throughout the day. The patient also reports occasional diarrhea, which he attributes to metformin, and morning cough with phlegm but no blood. The patient has noticed increased sensitivity to cold, which he attributes to anemia. Vision has worsened, which may be secondary to diabetes.  The patient works at [employer], which requires prolonged standing and walking. He does not smoke, consume alcohol socially, and does not use street drugs. He has allergies to hydralazine, Lipitor, Visine, and lisinopril. The patient is separated and has no children. Family history includes colon polyps and heart disease in the mother, and diabetes, COPD, and sarcoidosis in the father. The patient's sister had colon polyps.   LABS from referring physician Serum protein electrophoresis: No M spike Free kappa light chain: 52.6 Free lambda light chain: 26.1 Kappa/lambda ratio: 2.02 Hb: 9.2 (12/06/2022) Hb: 8.7 (10/05/2022) Hb: 8.4 (09/04/2022)      HPI  Past Medical History:  Diagnosis Date   Acute pancreatitis 09/13/2013   Allergy    SEASONAL   Anxiety    Arthritis    RIGHT SHOULDER   Depression    DIABETES MELLITUS, TYPE II, UNCONTROLLED 01/02/2010   ELEVATED BLOOD PRESSURE 12/31/2009   GERD (gastroesophageal reflux disease)  Hyperlipidemia    Hypertension    Neuropathy    FEET BILATERAL   Pancreatitis 09/13/2013   Sleep apnea     Past Surgical  History:  Procedure Laterality Date   CHOLECYSTECTOMY, LAPAROSCOPIC  03/15/2019   COLONOSCOPY  12/23/2021   WISDOM TOOTH EXTRACTION      Family History  Problem Relation Age of Onset   Colon polyps Mother    Heart disease Mother    Allergies Mother    Diabetes Father    Heart disease Father    Emphysema Father    Sarcoidosis Father    Colon polyps Sister    Colon cancer Neg Hx    Crohn's disease Neg Hx    Esophageal cancer Neg Hx    Rectal cancer Neg Hx    Stomach cancer Neg Hx    Ulcerative colitis Neg Hx     Social History Social History   Tobacco Use   Smoking status: Never    Passive exposure: Past   Smokeless tobacco: Never  Vaping Use   Vaping status: Never Used  Substance Use Topics   Alcohol use: Yes    Comment: social--2-3 glasses wine occassionally   Drug use: No    Allergies  Allergen Reactions   Hydralazine Swelling    Hydralazine caused pt have anasarca per Dr. Tenny Craw   Exenatide Other (See Comments)    pancreatitis   Invokana [Canagliflozin] Other (See Comments)    Yeast balanitis   Lipitor [Atorvastatin] Other (See Comments)    Arthralgias.   Lisinopril Other (See Comments)    pancreatitis    Current Outpatient Medications  Medication Sig Dispense Refill   Accu-Chek Softclix Lancets lancets Check blood sugar three times a day. 30 day supply. No refills 100 each 0   amLODipine (NORVASC) 10 MG tablet Take 1 tablet (10 mg total) by mouth daily. 90 tablet 3   chlorthalidone (HYGROTON) 25 MG tablet Take 25 mg by mouth daily.     Continuous Blood Gluc Sensor (FREESTYLE LIBRE 3 SENSOR) MISC Place 1 sensor on the skin every 14 days. Use to check glucose continuously. 30 day supply. No refills 2 each 0   dapagliflozin propanediol (FARXIGA) 10 MG TABS tablet Take by mouth.     fluticasone (FLONASE ALLERGY RELIEF) 50 MCG/ACT nasal spray 1 spray daily as needed.     furosemide (LASIX) 40 MG tablet Take 1 tablet (40 mg total) by mouth daily. 90 tablet 3    insulin aspart (NOVOLOG FLEXPEN) 100 UNIT/ML FlexPen Sliding scale     Insulin Pen Needle (PEN NEEDLES 3/16") 31G X 5 MM MISC Dispense enough pen needles for tresiba 35 units daily, novolog 8 units TID w/ meals x 30 days. No refills. (Patient taking differently: Dispense enough pen needles for tresiba 26 units daily, novolog 8 units TID w/ meals x 30 days. No refills.) 120 each 0   Lancet Devices (SIMPLE DIAGNOSTICS LANCING DEV) MISC 1 each by Misc.(Non-Drug; Combo Route) route 2 times daily.     metFORMIN (GLUCOPHAGE) 1000 MG tablet Take 1,000 mg by mouth 2 (two) times daily.     Multiple Vitamins-Minerals (ALIVE MULTI-VITAMIN PO) Take by mouth.     pantoprazole (PROTONIX) 40 MG tablet Take 1 tablet (40 mg total) by mouth daily. 90 tablet 3   potassium chloride (KLOR-CON) 10 MEQ tablet Take 1 tablet (10 mEq total) by mouth daily. 90 tablet 3   rosuvastatin (CRESTOR) 20 MG tablet Take 1 tablet (20 mg total) by mouth daily.  90 tablet 1   spironolactone (ALDACTONE) 50 MG tablet Take by mouth.     telmisartan (MICARDIS) 80 MG tablet Take 1 tablet (80 mg total) by mouth daily. 90 tablet 3   TRESIBA FLEXTOUCH 100 UNIT/ML FlexTouch Pen 32 units daily     Vitamin D, Ergocalciferol, (DRISDOL) 1.25 MG (50000 UNIT) CAPS capsule Take by mouth.     No current facility-administered medications for this visit.    Review of Systems  Constitutional: positive for fatigue Eyes: negative Ears, nose, mouth, throat, and face: negative Respiratory: positive for dyspnea on exertion Cardiovascular: negative Gastrointestinal: negative Genitourinary:negative Integument/breast: negative Hematologic/lymphatic: negative Musculoskeletal:negative Neurological: negative Behavioral/Psych: negative Endocrine: negative Allergic/Immunologic: negative  Physical Exam  ZOX:WRUEA, healthy, no distress, well nourished, and well developed SKIN: skin color, texture, turgor are normal, no rashes or significant  lesions HEAD: Normocephalic, No masses, lesions, tenderness or abnormalities EYES: normal, PERRLA, Conjunctiva are pink and non-injected EARS: External ears normal, Canals clear OROPHARYNX:no exudate, no erythema, and lips, buccal mucosa, and tongue normal  NECK: supple, no adenopathy LYMPH:  no palpable lymphadenopathy, no hepatosplenomegaly LUNGS: clear to auscultation , and palpation HEART: regular rate & rhythm, no murmurs, and no gallops ABDOMEN:abdomen soft, non-tender, obese, normal bowel sounds, and no masses or organomegaly BACK: Back symmetric, no curvature., No CVA tenderness EXTREMITIES:no joint deformities, effusion, or inflammation, no edema  NEURO: alert & oriented x 3 with fluent speech, no focal motor/sensory deficits  PERFORMANCE STATUS: ECOG 1  LABORATORY DATA: Lab Results  Component Value Date   WBC 6.2 10/08/2022   HGB 8.7 (L) 10/08/2022   HCT 26.6 (L) 10/08/2022   MCV 82 10/08/2022   PLT 344 10/08/2022      Chemistry      Component Value Date/Time   NA 141 11/28/2022 1015   K 4.1 11/28/2022 1015   CL 103 11/28/2022 1015   CO2 25 11/28/2022 1015   BUN 26 (H) 11/28/2022 1015   CREATININE 1.37 (H) 11/28/2022 1015      Component Value Date/Time   CALCIUM 8.5 (L) 11/28/2022 1015   ALKPHOS 120 11/28/2022 1015   AST 28 11/28/2022 1015   ALT 21 11/28/2022 1015   BILITOT 1.1 11/28/2022 1015       RADIOGRAPHIC STUDIES: No results found.  ASSESSMENT AND PLAN: This is a very pleasant 47 years old African-American male presented for evaluation of suspicious monoclonal gammopathy as well as persistent anemia. Assessment and Plan    Elevated Light Chains Elevated free kappa light chain at 52.6 (normal <20) and free lambda light chain at 26.1 with a ratio of 2.02. No M spike detected on serum protein electrophoresis. Differential diagnosis includes monoclonal gammopathy or secondary to chronic kidney disease. Further investigation needed to rule out  monoclonal gammopathy. - Repeat serum protein electrophoresis with immune fixation  Anemia Chronic anemia with hemoglobin at 9.2, previously 8.7 and 8.4. Likely multifactorial, potentially due to chronic kidney disease. Recent colonoscopy showed esophagitis and gastric polyps but no source of bleeding. Further workup needed to determine etiology. Discussed potential bone marrow biopsy if blood work is concerning. - Order iron studies - Order vitamin B12 - Order serum folate - Consider bone marrow biopsy if blood work is concerning - Evaluate need for iron supplements based on iron study results  Chronic Kidney Disease Chronic kidney disease with declining function but still within acceptable range for age. Associated with elevated protein levels and anemia. Discussed the relationship between kidney disease and protein levels. - Monitor kidney function -  Continue furosemide and spironolactone  Diabetes Mellitus Type 2 diabetes mellitus, managed with metformin. Reports periodic diarrhea, likely a side effect of metformin. Vision changes noted, likely secondary to diabetes. Advised to follow up with optometrist for vision changes. - Continue metformin - Follow up with optometrist for vision changes  General Health Maintenance Hypertension, hyperlipidemia, pancreatitis, depression, neuropathy, sleep apnea, GERD, arthritis, and seasonal allergies. No new issues reported. - Continue current management for chronic conditions  Follow-up - Schedule follow-up appointment in one month - Review test results and determine need for further investigation or monitoring.    The patient voices understanding of current disease status and treatment options and is in agreement with the current care plan.  All questions were answered. The patient knows to call the clinic with any problems, questions or concerns. We can certainly see the patient much sooner if necessary.  Thank you so much for allowing me  to participate in the care of Lavonta Manus. I will continue to follow up the patient with you and assist in his care.  The total time spent in the appointment was 60 minutes.  Disclaimer: This note was dictated with voice recognition software. Similar sounding words can inadvertently be transcribed and may not be corrected upon review.   Lajuana Matte December 06, 2022, 8:06 AM

## 2022-12-08 LAB — KAPPA/LAMBDA LIGHT CHAINS
Kappa free light chain: 72.6 mg/L — ABNORMAL HIGH (ref 3.3–19.4)
Kappa, lambda light chain ratio: 2.14 — ABNORMAL HIGH (ref 0.26–1.65)
Lambda free light chains: 33.9 mg/L — ABNORMAL HIGH (ref 5.7–26.3)

## 2022-12-08 LAB — BETA 2 MICROGLOBULIN, SERUM: Beta-2 Microglobulin: 3.8 mg/L — ABNORMAL HIGH (ref 0.6–2.4)

## 2022-12-09 ENCOUNTER — Telehealth: Payer: Self-pay | Admitting: Internal Medicine

## 2022-12-09 ENCOUNTER — Other Ambulatory Visit: Payer: Self-pay

## 2022-12-09 DIAGNOSIS — I10 Essential (primary) hypertension: Secondary | ICD-10-CM

## 2022-12-09 DIAGNOSIS — R0989 Other specified symptoms and signs involving the circulatory and respiratory systems: Secondary | ICD-10-CM

## 2022-12-09 DIAGNOSIS — Z79899 Other long term (current) drug therapy: Secondary | ICD-10-CM

## 2022-12-09 MED ORDER — METOPROLOL SUCCINATE ER 50 MG PO TB24: 50.0000 mg | ORAL_TABLET | Freq: Every day | ORAL | 3 refills | Status: DC

## 2022-12-09 MED ORDER — CHLORTHALIDONE 25 MG PO TABS: 12.5000 mg | ORAL_TABLET | Freq: Every day | ORAL | Status: DC

## 2022-12-09 NOTE — Telephone Encounter (Signed)
Patient states he was returning call. Please advise ?

## 2022-12-09 NOTE — Telephone Encounter (Signed)
I spoke with the pt re: his lab results.

## 2022-12-11 LAB — MULTIPLE MYELOMA PANEL, SERUM
Albumin SerPl Elph-Mcnc: 3.6 g/dL (ref 2.9–4.4)
Albumin/Glob SerPl: 1.2 (ref 0.7–1.7)
Alpha 1: 0.2 g/dL (ref 0.0–0.4)
Alpha2 Glob SerPl Elph-Mcnc: 0.7 g/dL (ref 0.4–1.0)
B-Globulin SerPl Elph-Mcnc: 1 g/dL (ref 0.7–1.3)
Gamma Glob SerPl Elph-Mcnc: 1.3 g/dL (ref 0.4–1.8)
Globulin, Total: 3.1 g/dL (ref 2.2–3.9)
IgA: 303 mg/dL (ref 90–386)
IgG (Immunoglobin G), Serum: 1243 mg/dL (ref 603–1613)
IgM (Immunoglobulin M), Srm: 244 mg/dL — ABNORMAL HIGH (ref 20–172)
Total Protein ELP: 6.7 g/dL (ref 6.0–8.5)

## 2022-12-16 ENCOUNTER — Encounter: Payer: Self-pay | Admitting: Gastroenterology

## 2022-12-19 DIAGNOSIS — N182 Chronic kidney disease, stage 2 (mild): Secondary | ICD-10-CM | POA: Diagnosis not present

## 2022-12-22 DIAGNOSIS — E1165 Type 2 diabetes mellitus with hyperglycemia: Secondary | ICD-10-CM | POA: Diagnosis not present

## 2022-12-23 DIAGNOSIS — E1165 Type 2 diabetes mellitus with hyperglycemia: Secondary | ICD-10-CM | POA: Diagnosis not present

## 2022-12-29 ENCOUNTER — Ambulatory Visit: Payer: BLUE CROSS/BLUE SHIELD

## 2022-12-29 ENCOUNTER — Telehealth: Payer: Self-pay

## 2022-12-29 DIAGNOSIS — E782 Mixed hyperlipidemia: Secondary | ICD-10-CM | POA: Diagnosis not present

## 2022-12-29 DIAGNOSIS — N181 Chronic kidney disease, stage 1: Secondary | ICD-10-CM | POA: Diagnosis not present

## 2022-12-29 DIAGNOSIS — E1165 Type 2 diabetes mellitus with hyperglycemia: Secondary | ICD-10-CM | POA: Diagnosis not present

## 2022-12-29 DIAGNOSIS — I10 Essential (primary) hypertension: Secondary | ICD-10-CM | POA: Diagnosis not present

## 2022-12-29 NOTE — Telephone Encounter (Signed)
Patient came in for a nurse to have BP check after starting metoprolol XL 50 mg and chlorthalidone 25 mg  Patient have not started new medications. He states he was not aware he needed to start new medications.  Spoke with pharmacy and patient did not pick up his metoprolol but it is ready for pick up   Called patient to see if he wanted to cut chlorthalidone in half being that he just picked up a 90 day supply. He also need to reschedule nurse visit.  Left voice mail to return call to office

## 2022-12-31 ENCOUNTER — Ambulatory Visit (HOSPITAL_COMMUNITY)
Admission: RE | Admit: 2022-12-31 | Discharge: 2022-12-31 | Disposition: A | Discharge status: Home / Self Care | Payer: BLUE CROSS/BLUE SHIELD | Source: Ambulatory Visit | Attending: Cardiology | Admitting: Cardiology

## 2022-12-31 DIAGNOSIS — Z79899 Other long term (current) drug therapy: Secondary | ICD-10-CM | POA: Insufficient documentation

## 2022-12-31 DIAGNOSIS — I10 Essential (primary) hypertension: Secondary | ICD-10-CM | POA: Insufficient documentation

## 2022-12-31 DIAGNOSIS — R0989 Other specified symptoms and signs involving the circulatory and respiratory systems: Secondary | ICD-10-CM | POA: Insufficient documentation

## 2023-01-01 NOTE — Telephone Encounter (Signed)
I called to follow up with the pt.he has not started cutting the chlorthalidone in 1/2 and has not started the metoprolol... he saus his BP  has been doing better but odes not have a list of his readings.   He will make all of these changes starting tomorrow and asks to call me back to set up a nurse visit in 2 weeks and labs when he is in a better place to talk to me to make the appt.   From Dr Tenny Craw:   Thyroid function is normal   Marker of inflammation is mildly increased  Will follow Fluid still elevated Cr is 1.37   Mildly increased Cut back on chlorthalidone to 12.5 mg     Add Toprol XL  50 mg to med   Follow up for BP check in 2 wks

## 2023-01-05 ENCOUNTER — Ambulatory Visit: Payer: BLUE CROSS/BLUE SHIELD | Admitting: Internal Medicine

## 2023-01-05 ENCOUNTER — Inpatient Hospital Stay: Payer: BLUE CROSS/BLUE SHIELD | Attending: Hematology | Admitting: Internal Medicine

## 2023-01-05 ENCOUNTER — Inpatient Hospital Stay: Payer: BLUE CROSS/BLUE SHIELD

## 2023-01-05 VITALS — BP 168/74 | HR 83 | Temp 97.9°F | Resp 18 | Ht 74.0 in | Wt 311.9 lb

## 2023-01-05 DIAGNOSIS — D472 Monoclonal gammopathy: Secondary | ICD-10-CM | POA: Insufficient documentation

## 2023-01-05 DIAGNOSIS — G4730 Sleep apnea, unspecified: Secondary | ICD-10-CM | POA: Diagnosis not present

## 2023-01-05 DIAGNOSIS — Z9049 Acquired absence of other specified parts of digestive tract: Secondary | ICD-10-CM | POA: Diagnosis not present

## 2023-01-05 DIAGNOSIS — E1122 Type 2 diabetes mellitus with diabetic chronic kidney disease: Secondary | ICD-10-CM | POA: Insufficient documentation

## 2023-01-05 DIAGNOSIS — D890 Polyclonal hypergammaglobulinemia: Secondary | ICD-10-CM | POA: Diagnosis not present

## 2023-01-05 DIAGNOSIS — N189 Chronic kidney disease, unspecified: Secondary | ICD-10-CM | POA: Insufficient documentation

## 2023-01-05 DIAGNOSIS — I129 Hypertensive chronic kidney disease with stage 1 through stage 4 chronic kidney disease, or unspecified chronic kidney disease: Secondary | ICD-10-CM | POA: Insufficient documentation

## 2023-01-05 DIAGNOSIS — Z79899 Other long term (current) drug therapy: Secondary | ICD-10-CM | POA: Diagnosis not present

## 2023-01-05 DIAGNOSIS — D638 Anemia in other chronic diseases classified elsewhere: Secondary | ICD-10-CM | POA: Diagnosis not present

## 2023-01-05 DIAGNOSIS — E785 Hyperlipidemia, unspecified: Secondary | ICD-10-CM | POA: Insufficient documentation

## 2023-01-05 DIAGNOSIS — Z888 Allergy status to other drugs, medicaments and biological substances status: Secondary | ICD-10-CM | POA: Insufficient documentation

## 2023-01-05 LAB — CBC WITH DIFFERENTIAL (CANCER CENTER ONLY)
Abs Immature Granulocytes: 0.01 10*3/uL (ref 0.00–0.07)
Basophils Absolute: 0 10*3/uL (ref 0.0–0.1)
Basophils Relative: 1 %
Eosinophils Absolute: 0.1 10*3/uL (ref 0.0–0.5)
Eosinophils Relative: 2 %
HCT: 27.6 % — ABNORMAL LOW (ref 39.0–52.0)
Hemoglobin: 8.8 g/dL — ABNORMAL LOW (ref 13.0–17.0)
Immature Granulocytes: 0 %
Lymphocytes Relative: 23 %
Lymphs Abs: 1.3 10*3/uL (ref 0.7–4.0)
MCH: 26.5 pg (ref 26.0–34.0)
MCHC: 31.9 g/dL (ref 30.0–36.0)
MCV: 83.1 fL (ref 80.0–100.0)
Monocytes Absolute: 0.5 10*3/uL (ref 0.1–1.0)
Monocytes Relative: 10 %
Neutro Abs: 3.5 10*3/uL (ref 1.7–7.7)
Neutrophils Relative %: 64 %
Platelet Count: 281 10*3/uL (ref 150–400)
RBC: 3.32 MIL/uL — ABNORMAL LOW (ref 4.22–5.81)
RDW: 15.9 % — ABNORMAL HIGH (ref 11.5–15.5)
WBC Count: 5.4 10*3/uL (ref 4.0–10.5)
nRBC: 0 % (ref 0.0–0.2)

## 2023-01-05 LAB — CMP (CANCER CENTER ONLY)
ALT: 28 U/L (ref 0–44)
AST: 32 U/L (ref 15–41)
Albumin: 3.7 g/dL (ref 3.5–5.0)
Alkaline Phosphatase: 113 U/L (ref 38–126)
Anion gap: 4 — ABNORMAL LOW (ref 5–15)
BUN: 37 mg/dL — ABNORMAL HIGH (ref 6–20)
CO2: 28 mmol/L (ref 22–32)
Calcium: 9 mg/dL (ref 8.9–10.3)
Chloride: 108 mmol/L (ref 98–111)
Creatinine: 1.29 mg/dL — ABNORMAL HIGH (ref 0.61–1.24)
GFR, Estimated: >60 mL/min (ref >60)
Glucose, Bld: 83 mg/dL (ref 70–99)
Potassium: 4.6 mmol/L (ref 3.5–5.1)
Sodium: 140 mmol/L (ref 135–145)
Total Bilirubin: 1 mg/dL (ref <1.2)
Total Protein: 6.8 g/dL (ref 6.5–8.1)

## 2023-01-05 LAB — LACTATE DEHYDROGENASE: LDH: 207 U/L — ABNORMAL HIGH (ref 98–192)

## 2023-01-05 NOTE — Progress Notes (Signed)
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  PRIOR THERAPY: None  CURRENT THERAPY: Observation  INTERVAL HISTORY: Gabriel Waters 47 y.o. male returns to the clinic today for follow-up visit.Discussed the use of AI scribe software for clinical note transcription with the patient, who gave verbal consent to proceed.  History of Present Illness   Gabriel Waters, a 47 year old patient with a history of kidney disease, was previously found to have an elevated protein spike, raising concerns about monoclonal gammopathy of undetermined significance (MGUS). However, subsequent protein studies did not show the M spike, and the report indicated no evidence for MGUS. The patient's protein electrophoresis showed a polyclonal increase, suggesting more than one protein was involved, not just one. This could be due to the patient's kidney disease or other factors.  Further protein studies revealed elevated free kappa light chain and free lambda light chain, with a ratio of 2.14. Both were elevated, which is unusual as typically in conditions like multiple myeloma, one would be significantly elevated and the other decreased. The patient's beta 2-microglobulin was also elevated at 3.8. Iron levels were high, likely due to kidney disease and other conditions. Iron saturation was 16%, not far from normal, and serum iron was normal. The patient's anemia is likely due to chronic disease from kidney failure.  The patient's kidney disease is not severe, being between stages one and two. The patient has no other complaints at this time.       MEDICAL HISTORY: Past Medical History:  Diagnosis Date   Acute pancreatitis 09/13/2013   Allergy    SEASONAL   Anxiety    Arthritis    RIGHT  SHOULDER   Depression    DIABETES MELLITUS, TYPE II, UNCONTROLLED 01/02/2010   ELEVATED BLOOD PRESSURE 12/31/2009   GERD (gastroesophageal reflux disease)    Hyperlipidemia    Hypertension    Neuropathy    FEET BILATERAL   Pancreatitis 09/13/2013   Sleep apnea     ALLERGIES:  is allergic to hydralazine, exenatide, invokana [canagliflozin], lipitor [atorvastatin], and lisinopril.  MEDICATIONS:  Current Outpatient Medications  Medication Sig Dispense Refill   Accu-Chek Softclix Lancets lancets Check blood sugar three times a day. 30 day supply. No refills 100 each 0   amLODipine (NORVASC) 10 MG tablet Take 1 tablet (10 mg total) by mouth daily. 90 tablet 3   chlorthalidone (HYGROTON) 25 MG tablet Take 0.5 tablets (12.5 mg total) by mouth daily.     Continuous Blood Gluc Sensor (FREESTYLE LIBRE 3 SENSOR) MISC Place 1 sensor on the skin every 14 days. Use to check glucose continuously. 30 day supply. No refills 2 each 0   dapagliflozin propanediol (FARXIGA) 10 MG TABS tablet Take by mouth.     fluticasone (FLONASE ALLERGY RELIEF) 50 MCG/ACT nasal spray 1 spray daily as needed.     furosemide (LASIX) 40 MG tablet Take 1 tablet (40 mg total) by mouth daily. 90 tablet 3   insulin aspart (NOVOLOG FLEXPEN) 100 UNIT/ML FlexPen Sliding scale     Insulin Pen Needle (PEN NEEDLES 3/16") 31G X 5 MM MISC Dispense enough pen needles for tresiba 35 units daily, novolog 8 units TID w/ meals x 30 days. No refills. (Patient taking differently: Dispense enough pen needles for tresiba 26 units  daily, novolog 8 units TID w/ meals x 30 days. No refills.) 120 each 0   Lancet Devices (SIMPLE DIAGNOSTICS LANCING DEV) MISC 1 each by Misc.(Non-Drug; Combo Route) route 2 times daily.     metFORMIN (GLUCOPHAGE) 1000 MG tablet Take 1,000 mg by mouth 2 (two) times daily.     metoprolol succinate (TOPROL-XL) 50 MG 24 hr tablet Take 1 tablet (50 mg total) by mouth daily. Take with or immediately following a meal. 90 tablet  3   Multiple Vitamins-Minerals (ALIVE MULTI-VITAMIN PO) Take by mouth.     pantoprazole (PROTONIX) 40 MG tablet Take 1 tablet (40 mg total) by mouth daily. 90 tablet 3   potassium chloride (KLOR-CON) 10 MEQ tablet Take 1 tablet (10 mEq total) by mouth daily. 90 tablet 3   rosuvastatin (CRESTOR) 20 MG tablet Take 1 tablet (20 mg total) by mouth daily. 90 tablet 1   spironolactone (ALDACTONE) 50 MG tablet Take by mouth.     telmisartan (MICARDIS) 80 MG tablet Take 1 tablet (80 mg total) by mouth daily. 90 tablet 3   TRESIBA FLEXTOUCH 100 UNIT/ML FlexTouch Pen 32 units daily     Vitamin D, Ergocalciferol, (DRISDOL) 1.25 MG (50000 UNIT) CAPS capsule Take by mouth.     No current facility-administered medications for this visit.    SURGICAL HISTORY:  Past Surgical History:  Procedure Laterality Date   CHOLECYSTECTOMY, LAPAROSCOPIC  03/15/2019   COLONOSCOPY  12/23/2021   WISDOM TOOTH EXTRACTION      REVIEW OF SYSTEMS:  A comprehensive review of systems was negative except for: Constitutional: positive for fatigue   PHYSICAL EXAMINATION: General appearance: alert, cooperative, fatigued, and no distress Head: Normocephalic, without obvious abnormality, atraumatic Neck: no adenopathy, no JVD, supple, symmetrical, trachea midline, and thyroid not enlarged, symmetric, no tenderness/mass/nodules Lymph nodes: Cervical, supraclavicular, and axillary nodes normal. Resp: clear to auscultation bilaterally Back: symmetric, no curvature. ROM normal. No CVA tenderness. Cardio: regular rate and rhythm, S1, S2 normal, no murmur, click, rub or gallop GI: soft, non-tender; bowel sounds normal; no masses,  no organomegaly Extremities: extremities normal, atraumatic, no cyanosis or edema  ECOG PERFORMANCE STATUS: 1 - Symptomatic but completely ambulatory  Blood pressure (!) 168/74, pulse 83, temperature 97.9 F (36.6 C), temperature source Temporal, resp. rate 18, height 6\' 2"  (1.88 m), weight (!) 311 lb  14.4 oz (141.5 kg), SpO2 97%.  LABORATORY DATA: Lab Results  Component Value Date   WBC 5.4 01/05/2023   HGB 8.8 (L) 01/05/2023   HCT 27.6 (L) 01/05/2023   MCV 83.1 01/05/2023   PLT 281 01/05/2023      Chemistry      Component Value Date/Time   NA 140 12/06/2022 0801   NA 141 11/28/2022 1015   K 3.7 12/06/2022 0801   CL 104 12/06/2022 0801   CO2 30 12/06/2022 0801   BUN 29 (H) 12/06/2022 0801   BUN 26 (H) 11/28/2022 1015   CREATININE 1.60 (H) 12/06/2022 0801      Component Value Date/Time   CALCIUM 8.9 12/06/2022 0801   ALKPHOS 99 12/06/2022 0801   AST 29 12/06/2022 0801   ALT 24 12/06/2022 0801   BILITOT 1.7 (H) 12/06/2022 0801       RADIOGRAPHIC STUDIES: VAS US CAROTID Result Date: 12/31/2022 Carotid Arterial Duplex Study Patient Name:  ALTONIO KUZNIA  Date of Exam:   12/31/2022 Medical Rec #: [id number]       Accession #:    [id number] Date of Birth: 1975-12-11  Patient Gender: M Patient Age:   67 years Exam Location:  [facility] Procedure:      VAS US CAROTID Referring Phys: PAULA ROSS --------------------------------------------------------------------------------  Indications:       Patient hears a pulsing in both ears, especially while lying                    down. He does had hypertension which his provider is trying                    to manage. Risk Factors:      Hypertension, hyperlipidemia, Diabetes, no history of                    smoking. Other Factors:     History of OSA. Comparison Study:  None Performing Technologist: Alecia Mackin RVT, RDCS (AE), RDMS  Examination Guidelines: A complete evaluation includes B-mode imaging, spectral Doppler, color Doppler, and power Doppler as needed of all accessible portions of each vessel. Bilateral testing is considered an integral part of a complete examination. Limited examinations for reoccurring indications may be performed as noted.  Right Carotid Findings:  +----------+--------+--------+--------+------------------+------------------+           PSV cm/sEDV cm/sStenosisPlaque DescriptionComments           +----------+--------+--------+--------+------------------+------------------+ CCA Prox  187     21                                                   +----------+--------+--------+--------+------------------+------------------+ CCA Distal96      18                                intimal thickening +----------+--------+--------+--------+------------------+------------------+ ICA Prox  136     18                                intimal thickening +----------+--------+--------+--------+------------------+------------------+ ICA Mid   84      28                                                   +----------+--------+--------+--------+------------------+------------------+ ICA Distal103     33                                                   +----------+--------+--------+--------+------------------+------------------+ ECA       158     9                                 intimal thickening +----------+--------+--------+--------+------------------+------------------+ +----------+--------+-------+----------------+-------------------+           PSV cm/sEDV cmsDescribe        Arm Pressure (mmHG) +----------+--------+-------+----------------+-------------------+ [id number]            Multiphasic, [id number]                 +----------+--------+-------+----------------+-------------------+ +---------+--------+--+--------+--+---------+ VertebralPSV cm/s88EDV cm/s30Antegrade +---------+--------+--+--------+--+---------+  Left Carotid Findings: +----------+--------+--------+--------+------------------+------------------+           PSV cm/sEDV cm/sStenosisPlaque DescriptionComments           +----------+--------+--------+--------+------------------+------------------+ CCA Prox  162     15                                                    +----------+--------+--------+--------+------------------+------------------+ CCA Distal116     15                                intimal thickening +----------+--------+--------+--------+------------------+------------------+ ICA Prox  74      21                                                   +----------+--------+--------+--------+------------------+------------------+ ICA Mid   113     34                                tortuous           +----------+--------+--------+--------+------------------+------------------+ ICA Distal125     35                                tortuous           +----------+--------+--------+--------+------------------+------------------+ ECA       96      2                                                    +----------+--------+--------+--------+------------------+------------------+ +----------+--------+--------+----------------+-------------------+           PSV cm/sEDV cm/sDescribe        Arm Pressure (mmHG) +----------+--------+--------+----------------+-------------------+ [id number]             Multiphasic, [id number]                 +----------+--------+--------+----------------+-------------------+ +---------+--------+--+--------+--+---------+ VertebralPSV cm/s92EDV cm/s24Antegrade +---------+--------+--+--------+--+---------+   Summary: Right Carotid: The extracranial vessels were near-normal with only minimal wall                thickening or plaque. Left Carotid: The extracranial vessels were near-normal with only minimal wall               thickening or plaque. Vertebrals:  Bilateral vertebral arteries demonstrate antegrade flow. Subclavians: Normal flow hemodynamics were seen in bilateral subclavian              arteries. *See table(s) above for measurements and observations.  Electronically signed by Delrae Rend on 12/31/2022 at 9:19:03 PM.    Final     ASSESSMENT AND PLAN: This is a  very pleasant 47 years old African-American male with polyclonal gammopathy as well as anemia of chronic disease secondary to renal insufficiency.    Monoclonal Gammopathy of Undetermined Significance (MGUS) Previously identified elevated protein spike raised concern for MGUS. Recent tests showed no monoclonal spike, indicating a polyclonal increase in immunoglobulins. Elevated free  kappa and lambda light chains with a ratio of 2.14, and elevated beta 2-microglobulin at 3.8. No clear evidence of multiple myeloma. Differential diagnosis includes polyclonal gammopathy due to kidney disease. Discussed monitoring with blood work every six months versus bone marrow biopsy to rule out multiple myeloma and other hematologic malignancies. Explained bone marrow biopsy procedure, including local anesthesia, sedation, and post-procedure care. Patient opted for biopsy for peace of mind. - Order bone marrow biopsy to rule out multiple myeloma and other hematologic malignancies - Repeat blood work every six months for monitoring  Chronic Kidney Disease (CKD) CKD, currently between stages 1 and 2. Serum creatinine was 1.6 at the last visit. CKD could explain polyclonal protein increase and anemia. - Monitor kidney function - Coordinate care with nephrologist  Anemia of Chronic Disease Anemia likely secondary to CKD. Iron studies showed high iron but normal serum iron and iron saturation at 16%. - Monitor hemoglobin and iron levels regularly  Follow-up - Schedule follow-up appointment in one month to review biopsy and pathology results.   The patient was advised to call immediately if he has any other concerning symptoms in the interval. The patient voices understanding of current disease status and treatment options and is in agreement with the current care plan.  All questions were answered. The patient knows to call the clinic with any problems, questions or concerns. We can certainly see the patient much  sooner if necessary.  The total time spent in the appointment was 20 minutes.  Disclaimer: This note was dictated with voice recognition software. Similar sounding words can inadvertently be transcribed and may not be corrected upon review.

## 2023-01-07 ENCOUNTER — Encounter: Payer: Self-pay | Admitting: Internal Medicine

## 2023-01-07 DIAGNOSIS — I10 Essential (primary) hypertension: Secondary | ICD-10-CM

## 2023-01-07 DIAGNOSIS — R0989 Other specified symptoms and signs involving the circulatory and respiratory systems: Secondary | ICD-10-CM

## 2023-01-07 DIAGNOSIS — Z79899 Other long term (current) drug therapy: Secondary | ICD-10-CM

## 2023-01-08 MED ORDER — CHLORTHALIDONE 25 MG PO TABS: 12.5000 mg | ORAL_TABLET | Freq: Every day | ORAL | 3 refills | Status: AC

## 2023-01-12 ENCOUNTER — Other Ambulatory Visit: Payer: Self-pay | Admitting: Radiology

## 2023-01-12 DIAGNOSIS — D890 Polyclonal hypergammaglobulinemia: Secondary | ICD-10-CM

## 2023-01-12 NOTE — H&P (Signed)
Chief Complaint: Patient was seen in consultation today for a bone marrow biopsy.  at the request of [facility]  Referring Physician(s): Mohamed,Mohamed  Supervising Physician: Oley Balm  Patient Status: [facility] - Out-pt  Full Code  History of Present Illness: Gabriel Waters is a 47 y.o. 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 Patient is currently being followed by Dr. Arbutus Ped with [facility]. His last visit was 01/05/23. During the patient's initial consultation 12/06/22, it was reported that nephrology referred the patient to Dr. Arbutus Ped due to elevated free kappa light chain of 52.6, elevated free lambda light chain of 26.1, a kappa/lambda ratio of 2.02, and anemia. Patient had a colonoscopy and endoscopy, which did not explain his anemic state. Patient reports feeling well today. He reports his physician is trying to figure out why he is anemic.   Past Medical History:  Diagnosis Date   Acute pancreatitis 09/13/2013   Allergy    SEASONAL   Anxiety    Arthritis    RIGHT SHOULDER   Depression    DIABETES MELLITUS, TYPE II, UNCONTROLLED 01/02/2010   ELEVATED BLOOD PRESSURE 12/31/2009   GERD (gastroesophageal reflux disease)    Hyperlipidemia    Hypertension    Neuropathy    FEET BILATERAL   Pancreatitis 09/13/2013   Sleep apnea     Past Surgical History:  Procedure Laterality Date   CHOLECYSTECTOMY, LAPAROSCOPIC  03/15/2019   COLONOSCOPY  12/23/2021   WISDOM TOOTH EXTRACTION      Allergies: Hydralazine, Exenatide, Invokana [canagliflozin], Lipitor [atorvastatin], and Lisinopril  Medications: Prior to Admission medications   Medication Sig Start Date End Date Taking? Authorizing Provider  Accu-Chek Softclix Lancets lancets Check blood sugar three times a day. 30 day  supply. No refills 09/30/21   Charise Killian, MD  amLODipine (NORVASC) 10 MG tablet Take 1 tablet (10 mg total) by mouth daily. 11/28/22   Pricilla Riffle, MD  chlorthalidone (HYGROTON) 25 MG tablet Take 0.5 tablets (12.5 mg total) by mouth daily. 01/08/23   Pricilla Riffle, MD  Continuous Blood Gluc Sensor (FREESTYLE LIBRE 3 SENSOR) MISC Place 1 sensor on the skin every 14 days. Use to check glucose continuously. 30 day supply. No refills 09/30/21   Charise Killian, MD  dapagliflozin propanediol (FARXIGA) 10 MG TABS tablet Take by mouth.    [provider]  fluticasone (FLONASE ALLERGY RELIEF) 50 MCG/ACT nasal spray 1 spray daily as needed. 02/13/20   [provider]  furosemide (LASIX) 40 MG tablet Take 1 tablet (40 mg total) by mouth daily. 09/08/22   Pricilla Riffle, MD  insulin aspart (NOVOLOG FLEXPEN) 100 UNIT/ML FlexPen Sliding scale 10/14/21   [provider]  Insulin Pen Needle (PEN NEEDLES 3/16") 31G X 5 MM MISC Dispense enough pen needles for tresiba 35 units daily, novolog 8 units TID w/ meals x 30 days. No refills. Patient taking differently: Dispense enough pen needles for tresiba 26 units daily, novolog 8 units TID w/ meals x 30 days. No refills. 09/30/21   Charise Killian, MD  Lancet Devices (SIMPLE DIAGNOSTICS LANCING DEV) MISC 1 each by Misc.(Non-Drug; Combo Route) route 2 times daily. 02/09/20   [provider]  metFORMIN (GLUCOPHAGE) 1000 MG tablet Take 1,000 mg by mouth 2 (two) times daily.    [provider]  metoprolol succinate (TOPROL-XL) 50 MG 24 hr  tablet Take 1 tablet (50 mg total) by mouth daily. Take with or immediately following a meal. 12/09/22   Pricilla Riffle, MD  Multiple Vitamins-Minerals (ALIVE MULTI-VITAMIN PO) Take by mouth.    [provider]  pantoprazole (PROTONIX) 40 MG tablet Take 1 tablet (40 mg total) by mouth daily. 12/02/22   Meryl Dare, MD  potassium chloride (KLOR-CON) 10 MEQ tablet Take 1  tablet (10 mEq total) by mouth daily. 09/11/22 12/10/22  Pricilla Riffle, MD  rosuvastatin (CRESTOR) 20 MG tablet Take 1 tablet (20 mg total) by mouth daily. 02/03/18   Burchette, Elberta Fortis, MD  spironolactone (ALDACTONE) 50 MG tablet Take by mouth. 11/13/22   [provider]  telmisartan (MICARDIS) 80 MG tablet Take 1 tablet (80 mg total) by mouth daily. 10/10/22   Orbie Pyo, MD  TRESIBA FLEXTOUCH 100 UNIT/ML FlexTouch Pen 32 units daily    [provider]  Vitamin D, Ergocalciferol, (DRISDOL) 1.25 MG (50000 UNIT) CAPS capsule Take by mouth.    [provider]     Family History  Problem Relation Age of Onset   Colon polyps Mother    Heart disease Mother    Allergies Mother    Diabetes Father    Heart disease Father    Emphysema Father    Sarcoidosis Father    Colon polyps Sister    Colon cancer Neg Hx    Crohn's disease Neg Hx    Esophageal cancer Neg Hx    Rectal cancer Neg Hx    Stomach cancer Neg Hx    Ulcerative colitis Neg Hx     Social History   Socioeconomic History   Marital status: Married    Spouse name: Not on file   Number of children: Not on file   Years of education: Not on file   Highest education level: Not on file  Occupational History   Occupation: Product/process development scientist: [employer]  Tobacco Use   Smoking status: Never    Passive exposure: Past   Smokeless tobacco: Never  Vaping Use   Vaping status: Never Used  Substance and Sexual Activity   Alcohol use: Yes    Comment: social--2-3 glasses wine occassionally   Drug use: No   Sexual activity: Not on file  Other Topics Concern   Not on file  Social History Narrative   Right Handed.    Lives in a one story home. Lives with Mother.       Social Drivers of Corporate investment banker Strain: Not on file  Food Insecurity: No Food Insecurity (09/29/2021)   Hunger Vital Sign    Worried About Running Out of Food in the Last Year: Never true    Ran Out of Food  in the Last Year: Never true  Transportation Needs: No Transportation Needs (09/29/2021)   PRAPARE - Administrator, Civil Service (Medical): No    Lack of Transportation (Non-Medical): No  Physical Activity: Not on file  Stress: Not on file  Social Connections: Not on file     Review of Systems: A 12 point ROS discussed and pertinent positives are indicated in the HPI above.  All other systems are negative.  Review of Systems  Constitutional:  Negative for fever.  Respiratory:  Negative for shortness of breath.   Cardiovascular:  Negative for chest pain.  Gastrointestinal:  Negative for abdominal pain, diarrhea, nausea and vomiting.    Vital Signs: BP Marland Kitchen)  180/87   Pulse 84   Resp 16   Ht 6\' 2"  (1.88 m)   Wt (!) 311 lb 14.4 oz (141.5 kg)   SpO2 97%   BMI 40.05 kg/m     Physical Exam HENT:     Head: Normocephalic and atraumatic.     Mouth/Throat:     Mouth: Mucous membranes are moist.     Pharynx: Oropharynx is clear.  Cardiovascular:     Rate and Rhythm: Normal rate and regular rhythm.  Pulmonary:     Effort: Pulmonary effort is normal.     Breath sounds: Normal breath sounds.  Abdominal:     Palpations: Abdomen is soft.     Tenderness: There is no abdominal tenderness.  Skin:    General: Skin is warm.  Neurological:     Mental Status: He is alert and oriented to person, place, and time.  Psychiatric:        Thought Content: Thought content normal.        Judgment: Judgment normal.     Imaging: VAS US CAROTID Result Date: 12/31/2022 Carotid Arterial Duplex Study Patient Name:  Gabriel Waters  Date of Exam:   12/31/2022 Medical Rec #: [id number]       Accession #:    [id number] Date of Birth: 1975/10/11        Patient Gender: M Patient Age:   32 years Exam Location:  [facility] Procedure:      VAS US CAROTID Referring Phys: PAULA ROSS --------------------------------------------------------------------------------  Indications:       Patient hears a  pulsing in both ears, especially while lying                    down. He does had hypertension which his provider is trying                    to manage. Risk Factors:      Hypertension, hyperlipidemia, Diabetes, no history of                    smoking. Other Factors:     History of OSA. Comparison Study:  None Performing Technologist: Alecia Mackin RVT, RDCS (AE), RDMS  Examination Guidelines: A complete evaluation includes B-mode imaging, spectral Doppler, color Doppler, and power Doppler as needed of all accessible portions of each vessel. Bilateral testing is considered an integral part of a complete examination. Limited examinations for reoccurring indications may be performed as noted.  Right Carotid Findings: +----------+--------+--------+--------+------------------+------------------+           PSV cm/sEDV cm/sStenosisPlaque DescriptionComments           +----------+--------+--------+--------+------------------+------------------+ CCA Prox  187     21                                                   +----------+--------+--------+--------+------------------+------------------+ CCA Distal96      18                                intimal thickening +----------+--------+--------+--------+------------------+------------------+ ICA Prox  136     18                                intimal  thickening +----------+--------+--------+--------+------------------+------------------+ ICA Mid   84      28                                                   +----------+--------+--------+--------+------------------+------------------+ ICA Distal103     33                                                   +----------+--------+--------+--------+------------------+------------------+ ECA       158     9                                 intimal thickening +----------+--------+--------+--------+------------------+------------------+  +----------+--------+-------+----------------+-------------------+           PSV cm/sEDV cmsDescribe        Arm Pressure (mmHG) +----------+--------+-------+----------------+-------------------+ [id number]            Multiphasic, WNL160                 +----------+--------+-------+----------------+-------------------+ +---------+--------+--+--------+--+---------+ VertebralPSV cm/s88EDV cm/s30Antegrade +---------+--------+--+--------+--+---------+  Left Carotid Findings: +----------+--------+--------+--------+------------------+------------------+           PSV cm/sEDV cm/sStenosisPlaque DescriptionComments           +----------+--------+--------+--------+------------------+------------------+ CCA Prox  162     15                                                   +----------+--------+--------+--------+------------------+------------------+ CCA Distal116     15                                intimal thickening +----------+--------+--------+--------+------------------+------------------+ ICA Prox  74      21                                                   +----------+--------+--------+--------+------------------+------------------+ ICA Mid   113     34                                tortuous           +----------+--------+--------+--------+------------------+------------------+ ICA Distal125     35                                tortuous           +----------+--------+--------+--------+------------------+------------------+ ECA       96      2                                                    +----------+--------+--------+--------+------------------+------------------+ +----------+--------+--------+----------------+-------------------+  PSV cm/sEDV cm/sDescribe        Arm Pressure (mmHG) +----------+--------+--------+----------------+-------------------+ [id number]             Multiphasic, [id number]                  +----------+--------+--------+----------------+-------------------+ +---------+--------+--+--------+--+---------+ VertebralPSV cm/s92EDV cm/s24Antegrade +---------+--------+--+--------+--+---------+   Summary: Right Carotid: The extracranial vessels were near-normal with only minimal wall                thickening or plaque. Left Carotid: The extracranial vessels were near-normal with only minimal wall               thickening or plaque. Vertebrals:  Bilateral vertebral arteries demonstrate antegrade flow. Subclavians: Normal flow hemodynamics were seen in bilateral subclavian              arteries. *See table(s) above for measurements and observations.  Electronically signed by Delrae Rend on 12/31/2022 at 9:19:03 PM.    Final     Labs:  CBC: Recent Labs    10/03/22 0909 10/08/22 0928 12/06/22 0801 01/05/23 1005  WBC 5.9 6.2 5.3 5.4  HGB 8.4* 8.7* 9.2* 8.8*  HCT 25.7* 26.6* 28.8* 27.6*  PLT 314 344 290 281    COAGS: No results for input(s): "INR", "APTT" in the last 8760 hours.  BMP: Recent Labs    05/26/22 2217 08/01/22 1555 09/02/22 0826 10/03/22 0909 11/28/22 1015 12/06/22 0801 01/05/23 1005  NA 138 141   < > 141 141 140 140  K 3.6 4.0   < > 4.0 4.1 3.7 4.6  CL 107 110   < > 103 103 104 108  CO2 21* 24   < > 26 25 30 28   GLUCOSE 106* 74   < > 162* 135* 115* 83  BUN 29* 26*   < > 22 26* 29* 37*  CALCIUM 8.5* 8.2*   < > 8.6* 8.5* 8.9 9.0  CREATININE 1.29* 1.09   < > 1.26 1.37* 1.60* 1.29*  GFRNONAA >60 >60  --   --   --  53* >60   < > = values in this interval not displayed.    LIVER FUNCTION TESTS: Recent Labs    09/08/22 1120 11/28/22 1015 12/06/22 0801 01/05/23 1005  BILITOT 1.6* 1.1 1.7* 1.0  AST 30 28 29  32  ALT 19 21 24 28   ALKPHOS 99 120 99 113  PROT 6.3 6.3 7.2 6.8  ALBUMIN 3.6* 3.7* 3.8 3.7    TUMOR MARKERS: No results for input(s): "AFPTM", "CEA", "CA199", "CHROMGRNA" in the last 8760 hours.  Assessment and Plan:  Patient is  scheduled today for a bone marrow biopsy and aspiration.   Risks and benefits of bone marrow biopsy was discussed with the patient and/or patient's family including, but not limited to bleeding, infection, damage to adjacent structures or low yield requiring additional tests.  All of the questions were answered and there is agreement to proceed.  Consent signed and in chart.  Thank you for this interesting consult.  I greatly enjoyed meeting [employer] and look forward to participating in their care.  A copy of this report was sent to the requesting provider on this date.  Electronically Signed: Rosalita Levan, PA 01/13/2023, 10:08 AM   I spent a total of  15 Minutes   in face to face in clinical consultation, greater than 50% of which was counseling/coordinating care for bone marrow biopsy and aspiration.

## 2023-01-13 ENCOUNTER — Ambulatory Visit (HOSPITAL_COMMUNITY)
Admission: RE | Admit: 2023-01-13 | Discharge: 2023-01-13 | Disposition: A | Discharge status: Home / Self Care | Payer: BLUE CROSS/BLUE SHIELD | Source: Ambulatory Visit | Attending: Internal Medicine | Admitting: Internal Medicine

## 2023-01-13 ENCOUNTER — Encounter (HOSPITAL_COMMUNITY): Payer: Self-pay

## 2023-01-13 ENCOUNTER — Other Ambulatory Visit: Payer: Self-pay

## 2023-01-13 DIAGNOSIS — F32A Depression, unspecified: Secondary | ICD-10-CM | POA: Insufficient documentation

## 2023-01-13 DIAGNOSIS — Z7984 Long term (current) use of oral hypoglycemic drugs: Secondary | ICD-10-CM | POA: Insufficient documentation

## 2023-01-13 DIAGNOSIS — G4730 Sleep apnea, unspecified: Secondary | ICD-10-CM | POA: Insufficient documentation

## 2023-01-13 DIAGNOSIS — E1122 Type 2 diabetes mellitus with diabetic chronic kidney disease: Secondary | ICD-10-CM | POA: Insufficient documentation

## 2023-01-13 DIAGNOSIS — N189 Chronic kidney disease, unspecified: Secondary | ICD-10-CM | POA: Insufficient documentation

## 2023-01-13 DIAGNOSIS — Z79899 Other long term (current) drug therapy: Secondary | ICD-10-CM | POA: Insufficient documentation

## 2023-01-13 DIAGNOSIS — K219 Gastro-esophageal reflux disease without esophagitis: Secondary | ICD-10-CM | POA: Insufficient documentation

## 2023-01-13 DIAGNOSIS — Z7985 Long-term (current) use of injectable non-insulin antidiabetic drugs: Secondary | ICD-10-CM | POA: Diagnosis not present

## 2023-01-13 DIAGNOSIS — D890 Polyclonal hypergammaglobulinemia: Secondary | ICD-10-CM | POA: Insufficient documentation

## 2023-01-13 DIAGNOSIS — E1140 Type 2 diabetes mellitus with diabetic neuropathy, unspecified: Secondary | ICD-10-CM | POA: Insufficient documentation

## 2023-01-13 DIAGNOSIS — I129 Hypertensive chronic kidney disease with stage 1 through stage 4 chronic kidney disease, or unspecified chronic kidney disease: Secondary | ICD-10-CM | POA: Diagnosis not present

## 2023-01-13 DIAGNOSIS — D631 Anemia in chronic kidney disease: Secondary | ICD-10-CM | POA: Insufficient documentation

## 2023-01-13 DIAGNOSIS — E785 Hyperlipidemia, unspecified: Secondary | ICD-10-CM | POA: Diagnosis not present

## 2023-01-13 DIAGNOSIS — F419 Anxiety disorder, unspecified: Secondary | ICD-10-CM | POA: Diagnosis not present

## 2023-01-13 DIAGNOSIS — D649 Anemia, unspecified: Secondary | ICD-10-CM | POA: Diagnosis not present

## 2023-01-13 DIAGNOSIS — Z794 Long term (current) use of insulin: Secondary | ICD-10-CM | POA: Diagnosis not present

## 2023-01-13 LAB — GLUCOSE, CAPILLARY: Glucose-Capillary: 74 mg/dL (ref 70–99)

## 2023-01-13 LAB — CBC
HCT: 25.9 % — ABNORMAL LOW (ref 39.0–52.0)
Hemoglobin: 8.5 g/dL — ABNORMAL LOW (ref 13.0–17.0)
MCH: 28 pg (ref 26.0–34.0)
MCHC: 32.8 g/dL (ref 30.0–36.0)
MCV: 85.2 fL (ref 80.0–100.0)
Platelets: 235 10*3/uL (ref 150–400)
RBC: 3.04 MIL/uL — ABNORMAL LOW (ref 4.22–5.81)
RDW: 15.8 % — ABNORMAL HIGH (ref 11.5–15.5)
WBC: 4.5 10*3/uL (ref 4.0–10.5)
nRBC: 0 % (ref 0.0–0.2)

## 2023-01-13 LAB — PROTIME-INR
INR: 1.3 — ABNORMAL HIGH (ref 0.8–1.2)
Prothrombin Time: 16 s — ABNORMAL HIGH (ref 11.4–15.2)

## 2023-01-13 MED ORDER — SODIUM CHLORIDE 0.9 % IV SOLN: INTRAVENOUS | Status: DC

## 2023-01-13 MED ORDER — MIDAZOLAM HCL 2 MG/2ML IJ SOLN
INTRAMUSCULAR | Status: AC | PRN
  Administered 2023-01-13: 1 mg via INTRAVENOUS

## 2023-01-13 MED ORDER — NALOXONE HCL 0.4 MG/ML IJ SOLN
INTRAMUSCULAR | Status: AC
  Filled 2023-01-13: qty 1

## 2023-01-13 MED ORDER — FLUMAZENIL 0.5 MG/5ML IV SOLN
INTRAVENOUS | Status: AC
  Filled 2023-01-13: qty 5

## 2023-01-13 MED ORDER — FENTANYL CITRATE (PF) 100 MCG/2ML IJ SOLN
INTRAMUSCULAR | Status: AC
  Filled 2023-01-13: qty 4

## 2023-01-13 MED ORDER — FENTANYL CITRATE (PF) 100 MCG/2ML IJ SOLN
INTRAMUSCULAR | Status: AC | PRN
  Administered 2023-01-13: 50 ug via INTRAVENOUS

## 2023-01-13 MED ORDER — MIDAZOLAM HCL 2 MG/2ML IJ SOLN
INTRAMUSCULAR | Status: AC
  Filled 2023-01-13: qty 4

## 2023-01-13 NOTE — Discharge Instructions (Addendum)
Bone Marrow Aspiration and Bone Marrow Biopsy, Adult, Care After  The following information offers guidance on how to care for yourself after your procedure. Your health care provider may also give you more specific instructions. If you have problems or questions, contact your health care provider.  What can I expect after the procedure?  May remove dressing or bandaid and shower tomorrow.  Keep site clean and dry. Replace with clean dressing or bandaid as necessary. Urgent needs IR clinic [phone] (mon-fri 8-5).  After the procedure, it is common to have: Mild pain and tenderness. Swelling. Bruising. Follow these instructions at home: Incision care  Follow instructions from your health care provider about how to take care of the incision site. Make sure you: Wash your hands with soap and water for at least 20 seconds before and after you change your bandage (dressing). If soap and water are not available, use hand sanitizer. Change your dressing as told by your health care provider. Leave stitches (sutures), skin glue, or adhesive strips in place. These skin closures may need to stay in place for 2 weeks or longer. If adhesive strip edges start to loosen and curl up, you may trim the loose edges. Do not remove adhesive strips completely unless your health care provider tells you to do that. Check your incision site every day for signs of infection. Check for: More redness, swelling, or pain. Fluid or blood. Warmth. Pus or a bad smell. Activity Return to your normal activities as told by your health care provider. Ask your health care provider what activities are safe for you. Do not lift anything that is heavier than 10 lb (4.5 kg), or the limit that you are told, until your health care provider says that it is safe. If you were given a sedative during the procedure, it can affect you for  several hours. Do not drive or operate machinery until your health care provider says that it is safe. General instructions  Take over-the-counter and prescription medicines only as told by your health care provider. Do not take baths, swim, or use a hot tub until your health care provider approves. Ask your health care provider if you may take showers. You may only be allowed to take sponge baths. If directed, put ice on the affected area. To do this: Put ice in a plastic bag. Place a towel between your skin and the bag. Leave the ice on for 20 minutes, 2-3 times a day. If your skin turns bright red, remove the ice right away to prevent skin damage. The risk of skin damage is higher if you cannot feel pain, heat, or cold. Contact a health care provider if: You have signs of infection. Your pain is not controlled with medicine. You have cancer, and a temperature of 100.4F (38C) or higher. Get help right away if: You have a temperature of 101F (38.3C) or higher, or as told by your health care provider. You have bleeding from the incision site that cannot be controlled. This information is not intended to replace advice given to you by your health care provider. Make sure you discuss any questions you have with your health care provider. Document Revised: 05/13/2021 Document Reviewed: 05/13/2021 Elsevier Patient Education  2023 Elsevier Inc.                            Moderate Conscious Sedation, Adult, Care After  This sheet gives you information about how to care   for yourself after your procedure. Your health care provider may also give you more specific instructions. If you have problems or questions, contact your health care provider. What can I expect after the procedure? After the procedure, it is common to have: Sleepiness for several hours. Impaired judgment for several hours. Difficulty with balance. Vomiting if you eat too soon. Follow these instructions at home: For  the time period you were told by your health care provider:     Rest. Do not participate in activities where you could fall or become injured. Do not drive or use machinery. Do not drink alcohol. Do not take sleeping pills or medicines that cause drowsiness. Do not make important decisions or sign legal documents. Do not take care of children on your own. Eating and drinking  Follow the diet recommended by your health care provider. Drink enough fluid to keep your urine pale yellow. If you vomit: Drink water, juice, or soup when you can drink without vomiting. Make sure you have little or no nausea before eating solid foods. General instructions Take over-the-counter and prescription medicines only as told by your health care provider. Have a responsible adult stay with you for the time you are told. It is important to have someone help care for you until you are awake and alert. Do not smoke. Keep all follow-up visits as told by your health care provider. This is important. Contact a health care provider if: You are still sleepy or having trouble with balance after 24 hours. You feel light-headed. You keep feeling nauseous or you keep vomiting. You develop a rash. You have a fever. You have redness or swelling around the IV site. Get help right away if: You have trouble breathing. You have new-onset confusion at home. Summary After the procedure, it is common to feel sleepy, have impaired judgment, or feel nauseous if you eat too soon. Rest after you get home. Know the things you should not do after the procedure. Follow the diet recommended by your health care provider and drink enough fluid to keep your urine pale yellow. Get help right away if you have trouble breathing or new-onset confusion at home. This information is not intended to replace advice given to you by your health care provider. Make sure you discuss any questions you have with your health care  provider. Document Revised: 05/06/2019 Document Reviewed: 12/02/2018 Elsevier Patient Education  2023 Elsevier Inc.  

## 2023-01-13 NOTE — Procedures (Signed)
  Procedure:  CT bone marrow biopsy R iliac Preprocedure diagnosis: The encounter diagnosis was Polyclonal gammopathy. Postprocedure diagnosis: same EBL:    minimal Complications:   none immediate  See full dictation in [employer].  Thora Lance MD Main # [phone] Pager  [phone] Mobile [phone]

## 2023-01-16 LAB — SURGICAL PATHOLOGY

## 2023-01-23 ENCOUNTER — Encounter (HOSPITAL_COMMUNITY): Payer: Self-pay | Admitting: Internal Medicine

## 2023-01-23 ENCOUNTER — Telehealth: Payer: Self-pay

## 2023-01-23 NOTE — Telephone Encounter (Signed)
 Spoke with patient, states Dr Okey wanted him to have a BP nurse visit with us  after being on his new medication for 2 weeks (metoprolol ) Patient has just started new medication today, nurse visit for BP check scheduled for 1/17 at 11 am. No further needs at this time

## 2023-01-23 NOTE — Telephone Encounter (Signed)
 Attempted to contact patient to set up BP check (or to see if he has a way to check this at home), left voicemail to call back

## 2023-01-26 DIAGNOSIS — H35033 Hypertensive retinopathy, bilateral: Secondary | ICD-10-CM | POA: Diagnosis not present

## 2023-01-26 DIAGNOSIS — H35372 Puckering of macula, left eye: Secondary | ICD-10-CM | POA: Diagnosis not present

## 2023-01-26 DIAGNOSIS — H4312 Vitreous hemorrhage, left eye: Secondary | ICD-10-CM | POA: Diagnosis not present

## 2023-01-26 DIAGNOSIS — H43812 Vitreous degeneration, left eye: Secondary | ICD-10-CM | POA: Diagnosis not present

## 2023-01-26 DIAGNOSIS — E113513 Type 2 diabetes mellitus with proliferative diabetic retinopathy with macular edema, bilateral: Secondary | ICD-10-CM | POA: Diagnosis not present

## 2023-02-01 DIAGNOSIS — G4733 Obstructive sleep apnea (adult) (pediatric): Secondary | ICD-10-CM | POA: Diagnosis not present

## 2023-02-04 ENCOUNTER — Inpatient Hospital Stay: Payer: BLUE CROSS/BLUE SHIELD | Attending: Hematology

## 2023-02-04 ENCOUNTER — Inpatient Hospital Stay: Payer: BLUE CROSS/BLUE SHIELD | Admitting: Internal Medicine

## 2023-02-04 VITALS — BP 177/74 | HR 89 | Temp 97.0°F | Resp 17 | Ht 74.0 in | Wt 315.9 lb

## 2023-02-04 DIAGNOSIS — D890 Polyclonal hypergammaglobulinemia: Secondary | ICD-10-CM | POA: Diagnosis not present

## 2023-02-04 DIAGNOSIS — I129 Hypertensive chronic kidney disease with stage 1 through stage 4 chronic kidney disease, or unspecified chronic kidney disease: Secondary | ICD-10-CM | POA: Diagnosis not present

## 2023-02-04 DIAGNOSIS — Z888 Allergy status to other drugs, medicaments and biological substances status: Secondary | ICD-10-CM | POA: Diagnosis not present

## 2023-02-04 DIAGNOSIS — R5383 Other fatigue: Secondary | ICD-10-CM | POA: Insufficient documentation

## 2023-02-04 DIAGNOSIS — G4730 Sleep apnea, unspecified: Secondary | ICD-10-CM | POA: Diagnosis not present

## 2023-02-04 DIAGNOSIS — Z79899 Other long term (current) drug therapy: Secondary | ICD-10-CM | POA: Diagnosis not present

## 2023-02-04 DIAGNOSIS — Z9049 Acquired absence of other specified parts of digestive tract: Secondary | ICD-10-CM | POA: Diagnosis not present

## 2023-02-04 DIAGNOSIS — N189 Chronic kidney disease, unspecified: Secondary | ICD-10-CM | POA: Insufficient documentation

## 2023-02-04 DIAGNOSIS — E1122 Type 2 diabetes mellitus with diabetic chronic kidney disease: Secondary | ICD-10-CM | POA: Insufficient documentation

## 2023-02-04 DIAGNOSIS — D631 Anemia in chronic kidney disease: Secondary | ICD-10-CM | POA: Insufficient documentation

## 2023-02-04 DIAGNOSIS — E785 Hyperlipidemia, unspecified: Secondary | ICD-10-CM | POA: Insufficient documentation

## 2023-02-04 LAB — CBC WITH DIFFERENTIAL (CANCER CENTER ONLY)
Abs Immature Granulocytes: 0.01 10*3/uL (ref 0.00–0.07)
Basophils Absolute: 0 10*3/uL (ref 0.0–0.1)
Basophils Relative: 0 %
Eosinophils Absolute: 0.1 10*3/uL (ref 0.0–0.5)
Eosinophils Relative: 3 %
HCT: 25 % — ABNORMAL LOW (ref 39.0–52.0)
Hemoglobin: 7.9 g/dL — ABNORMAL LOW (ref 13.0–17.0)
Immature Granulocytes: 0 %
Lymphocytes Relative: 24 %
Lymphs Abs: 0.9 10*3/uL (ref 0.7–4.0)
MCH: 25.9 pg — ABNORMAL LOW (ref 26.0–34.0)
MCHC: 31.6 g/dL (ref 30.0–36.0)
MCV: 82 fL (ref 80.0–100.0)
Monocytes Absolute: 0.6 10*3/uL (ref 0.1–1.0)
Monocytes Relative: 16 %
Neutro Abs: 2.1 10*3/uL (ref 1.7–7.7)
Neutrophils Relative %: 57 %
Platelet Count: 262 10*3/uL (ref 150–400)
RBC: 3.05 MIL/uL — ABNORMAL LOW (ref 4.22–5.81)
RDW: 15 % (ref 11.5–15.5)
WBC Count: 3.7 10*3/uL — ABNORMAL LOW (ref 4.0–10.5)
nRBC: 0 % (ref 0.0–0.2)

## 2023-02-04 LAB — LACTATE DEHYDROGENASE: LDH: 254 U/L — ABNORMAL HIGH (ref 98–192)

## 2023-02-04 NOTE — Progress Notes (Signed)
 [facility] [phone]   [phone]  OFFICE PROGRESS NOTE  Angelia Kelp, NP 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  PRIOR THERAPY: None  CURRENT THERAPY: Observation  INTERVAL HISTORY: Gabriel Waters 48 y.o. male returns to the clinic today for follow-up visit.Discussed the use of AI scribe software for clinical note transcription with the patient, who gave verbal consent to proceed.  History of Present Illness   The 48 year old patient with a history of kidney disease, diabetes, dyslipidemia, hypertension, and coronary kidney disease, presented a few months ago with anemia and polyclonal gammopathy. The anemia was initially thought to be secondary to chronic kidney disease, and the cause of the polyclonal gammopathy was unknown. A bone marrow biopsy and aspirate were performed to investigate these findings further.  The patient tolerated the procedure well, experiencing only mild discomfort and fatigue on the day of the procedure. The results of the bone marrow biopsy showed no evidence of multiple myeloma, leukemia, lymphoma, myelodysplastic syndrome, or myeloproliferative disorder.  Despite these reassuring findings, the patient continues to experience anemia. The patient's iron and B12 levels have been consistently within normal ranges, suggesting that the anemia is not due to a deficiency that can be corrected. The patient's hemoglobin level has decreased slightly since the last visit, from the eight to nine range to 7.9. The patient has no family history of sickle cell disease or thalassemia.       MEDICAL HISTORY: Past Medical History:  Diagnosis Date   Acute pancreatitis 09/13/2013   Allergy    SEASONAL   Anxiety    Arthritis    RIGHT SHOULDER   Depression    DIABETES MELLITUS, TYPE II, UNCONTROLLED 01/02/2010   ELEVATED  BLOOD PRESSURE 12/31/2009   GERD (gastroesophageal reflux disease)    Hyperlipidemia    Hypertension    Neuropathy    FEET BILATERAL   Pancreatitis 09/13/2013   Sleep apnea     ALLERGIES:  is allergic to hydralazine , exenatide , invokana  [canagliflozin ], lipitor [atorvastatin], and lisinopril .  MEDICATIONS:  Current Outpatient Medications  Medication Sig Dispense Refill   Accu-Chek Softclix Lancets lancets Check blood sugar three times a day. 30 day supply. No refills 100 each 0   amLODipine  (NORVASC ) 10 MG tablet Take 1 tablet (10 mg total) by mouth daily. 90 tablet 3   chlorthalidone  (HYGROTON ) 25 MG tablet Take 0.5 tablets (12.5 mg total) by mouth daily. 90 tablet 3   Continuous Blood Gluc Sensor (FREESTYLE LIBRE 3 SENSOR) MISC Place 1 sensor on the skin every 14 days. Use to check glucose continuously. 30 day supply. No refills 2 each 0   dapagliflozin propanediol (FARXIGA) 10 MG TABS tablet Take by mouth.     fluticasone (FLONASE ALLERGY RELIEF) 50 MCG/ACT nasal spray 1 spray daily as needed.     furosemide  (LASIX ) 40 MG tablet Take 1 tablet (40 mg total) by mouth daily. 90 tablet 3   insulin  aspart (NOVOLOG  FLEXPEN) 100 UNIT/ML FlexPen Sliding scale     Insulin  Pen Needle (PEN NEEDLES 3/16") 31G X 5 MM MISC Dispense enough pen needles for tresiba  35 units daily, novolog  8 units TID w/ meals x 30 days. No refills. (Patient taking differently: Dispense enough pen needles for tresiba  26 units daily, novolog  8 units TID w/ meals x 30 days. No refills.) 120 each 0   Lancet Devices (SIMPLE  DIAGNOSTICS LANCING DEV) MISC 1 each by Misc.(Non-Drug; Combo Route) route 2 times daily.     metFORMIN  (GLUCOPHAGE ) 1000 MG tablet Take 1,000 mg by mouth 2 (two) times daily.     metoprolol  succinate (TOPROL -XL) 50 MG 24 hr tablet Take 1 tablet (50 mg total) by mouth daily. Take with or immediately following a meal. 90 tablet 3   Multiple Vitamins-Minerals (ALIVE MULTI-VITAMIN PO) Take by mouth.      pantoprazole  (PROTONIX ) 40 MG tablet Take 1 tablet (40 mg total) by mouth daily. 90 tablet 3   potassium chloride  (KLOR-CON ) 10 MEQ tablet Take 1 tablet (10 mEq total) by mouth daily. 90 tablet 3   rosuvastatin  (CRESTOR ) 20 MG tablet Take 1 tablet (20 mg total) by mouth daily. 90 tablet 1   spironolactone  (ALDACTONE ) 50 MG tablet Take by mouth.     telmisartan  (MICARDIS ) 80 MG tablet Take 1 tablet (80 mg total) by mouth daily. 90 tablet 3   TRESIBA  FLEXTOUCH 100 UNIT/ML FlexTouch Pen 32 units daily     Vitamin D, Ergocalciferol, (DRISDOL) 1.25 MG (50000 UNIT) CAPS capsule Take by mouth.     No current facility-administered medications for this visit.    SURGICAL HISTORY:  Past Surgical History:  Procedure Laterality Date   CHOLECYSTECTOMY, LAPAROSCOPIC  03/15/2019   COLONOSCOPY  12/23/2021   WISDOM TOOTH EXTRACTION      REVIEW OF SYSTEMS:  A comprehensive review of systems was negative except for: Constitutional: positive for fatigue   PHYSICAL EXAMINATION: General appearance: alert, cooperative, fatigued, and no distress Head: Normocephalic, without obvious abnormality, atraumatic Neck: no adenopathy, no JVD, supple, symmetrical, trachea midline, and thyroid  not enlarged, symmetric, no tenderness/mass/nodules Lymph nodes: Cervical, supraclavicular, and axillary nodes normal. Resp: clear to auscultation bilaterally Back: symmetric, no curvature. ROM normal. No CVA tenderness. Cardio: regular rate and rhythm, S1, S2 normal, no murmur, click, rub or gallop GI: soft, non-tender; bowel sounds normal; no masses,  no organomegaly Extremities: extremities normal, atraumatic, no cyanosis or edema  ECOG PERFORMANCE STATUS: 1 - Symptomatic but completely ambulatory  Blood pressure (!) 177/74, pulse 89, temperature (!) 97 F (36.1 C), temperature source Temporal, resp. rate 17, height 6\' 2"  (1.88 m), weight (!) 315 lb 14.4 oz (143.3 kg), SpO2 98%.  LABORATORY DATA: Lab Results  Component  Value Date   WBC 3.7 (L) 02/04/2023   HGB 7.9 (L) 02/04/2023   HCT 25.0 (L) 02/04/2023   MCV 82.0 02/04/2023   PLT 262 02/04/2023      Chemistry      Component Value Date/Time   NA 140 01/05/2023 1005   NA 141 11/28/2022 1015   K 4.6 01/05/2023 1005   CL 108 01/05/2023 1005   CO2 28 01/05/2023 1005   BUN 37 (H) 01/05/2023 1005   BUN 26 (H) 11/28/2022 1015   CREATININE 1.29 (H) 01/05/2023 1005      Component Value Date/Time   CALCIUM  9.0 01/05/2023 1005   ALKPHOS 113 01/05/2023 1005   AST 32 01/05/2023 1005   ALT 28 01/05/2023 1005   BILITOT 1.0 01/05/2023 1005       RADIOGRAPHIC STUDIES: CT BONE MARROW BIOPSY & ASPIRATION Result Date: 01/13/2023 CLINICAL DATA:  Polyclonal gammopathy, rule out underlying plasma cell dyscrasia EXAM: CT GUIDED DEEP ILIAC BONE ASPIRATION AND CORE BIOPSY TECHNIQUE: Patient was placed prone on the CT gantry and limited axial scans through the pelvis were obtained. This exam was performed according to the departmental dose-optimization program which includes automated exposure control,  adjustment of the mA and/or kV according to patient size and/or use of iterative reconstruction technique. Appropriate skin entry site was identified. Skin site was marked, prepped with chlorhexidine , draped in usual sterile fashion, and infiltrated locally with 1% lidocaine . Intravenous Fentanyl  50mcg and Versed  1mg  were administered by RN during a total moderate (conscious) sedation time of 10 minutes; the patient's level of consciousness and physiological / cardiorespiratory status were monitored continuously by radiology RN under my direct supervision. Under CT fluoroscopic guidance an 11-gauge Cook trocar bone needle was advanced into the right iliac bone just lateral to the sacroiliac joint. Once needle tip position was confirmed, core and aspiration samples were obtained, submitted to pathology for approval. Patient tolerated procedure well. COMPLICATIONS:  COMPLICATIONS none IMPRESSION: 1. Technically successful CT guided right iliac bone core and aspiration biopsy. Electronically Signed   By: Nicoletta Barrier M.D.   On: 01/13/2023 15:02    ASSESSMENT AND PLAN: This is a very pleasant 48 years old African-American male with polyclonal gammopathy as well as anemia of chronic disease secondary to renal insufficiency.    Anemia of Chronic Disease Chronic anemia likely secondary to chronic kidney disease, diabetes, dyslipidemia, and hypertension. Bone marrow biopsy and aspirate showed no evidence of multiple myeloma, leukemia, lymphoma, myelodysplastic syndrome, or myeloproliferative disorder. Hemoglobin at 7.9, lower than previous levels (8-9). Normal iron and B12 levels rule out deficiency anemia. Ongoing monitoring needed due to potential unidentified underlying factors. - Monitor hemoglobin levels every six months - Order blood work one week prior to next visit to check protein levels  Polyclonal Gammopathy Polyclonal gammopathy identified with increased protein levels. Bone marrow biopsy showed no evidence of monoclonal gammopathy or other hematologic malignancies. Requires monitoring. - Reassess protein levels in six months with blood work one week prior to visit  Chronic Kidney Disease Contributing to anemia.  Diabetes Mellitus Contributing factor to anemia.  Dyslipidemia Contributing factor to anemia.  Hypertension Contributing factor to anemia.  Follow-up - Schedule follow-up visit in six months - Order blood work one week prior to next visit.   The patient was advised to call immediately if he has any other concerning symptoms in the interval. The patient voices understanding of current disease status and treatment options and is in agreement with the current care plan.  All questions were answered. The patient knows to call the clinic with any problems, questions or concerns. We can certainly see the patient much sooner if  necessary.  The total time spent in the appointment was 30 minutes.  Disclaimer: This note was dictated with voice recognition software. Similar sounding words can inadvertently be transcribed and may not be corrected upon review.

## 2023-02-09 DIAGNOSIS — H35372 Puckering of macula, left eye: Secondary | ICD-10-CM | POA: Diagnosis not present

## 2023-02-09 DIAGNOSIS — H2523 Age-related cataract, morgagnian type, bilateral: Secondary | ICD-10-CM | POA: Diagnosis not present

## 2023-02-09 DIAGNOSIS — E113513 Type 2 diabetes mellitus with proliferative diabetic retinopathy with macular edema, bilateral: Secondary | ICD-10-CM | POA: Diagnosis not present

## 2023-02-09 DIAGNOSIS — H524 Presbyopia: Secondary | ICD-10-CM | POA: Diagnosis not present

## 2023-02-10 ENCOUNTER — Other Ambulatory Visit: Payer: Self-pay

## 2023-02-10 ENCOUNTER — Ambulatory Visit: Payer: BLUE CROSS/BLUE SHIELD | Attending: Cardiology

## 2023-02-10 ENCOUNTER — Other Ambulatory Visit: Payer: Self-pay | Admitting: Internal Medicine

## 2023-02-10 VITALS — BP 150/74 | HR 85 | Ht 75.0 in | Wt 309.4 lb

## 2023-02-10 DIAGNOSIS — R0989 Other specified symptoms and signs involving the circulatory and respiratory systems: Secondary | ICD-10-CM

## 2023-02-10 DIAGNOSIS — Z79899 Other long term (current) drug therapy: Secondary | ICD-10-CM | POA: Diagnosis not present

## 2023-02-10 DIAGNOSIS — I10 Essential (primary) hypertension: Secondary | ICD-10-CM | POA: Diagnosis not present

## 2023-02-10 MED ORDER — METOPROLOL SUCCINATE ER 50 MG PO TB24: 50.0000 mg | ORAL_TABLET | Freq: Two times a day (BID) | ORAL | 3 refills | Status: DC

## 2023-02-10 NOTE — Progress Notes (Unsigned)
   Nurse Visit   Date of Encounter: 02/10/2023 ID: Gabriel Waters, DOB [birth year], MRN [id number]  PCP:  [name], NP   [facility] Providers Cardiologist:  Wynelle Link, MD  Click to update primary MD,subspecialty MD or APP then REFRESH:1}     Visit Details   VS:  BP (!) 150/74 (BP Location: Right Arm, Patient Position: Sitting, Cuff Size: Large)   Pulse 85   Ht 6\' 3"  (1.905 m)   Wt (!) 309 lb 6 oz (140.3 kg)   SpO2 99%   BMI 38.67 kg/m  , BMI Body mass index is 38.67 kg/m.  Wt Readings from Last 3 Encounters:  02/10/23 (!) 309 lb 6 oz (140.3 kg)  02/04/23 (!) 315 lb 14.4 oz (143.3 kg)  01/13/23 (!) 311 lb 14.4 oz (141.5 kg)     Reason for visit: BP chaeck after toprol XL 50 mg start Performed today: Vitals, EKG, Provider consulted:Dr. Dietrich Pates and DOD Dr Elberta Fortis made aware of visit, and Education Changes (medications, testing, etc.) : toprol xl 50mg  daily to toprol xl 50 mg twice daily, BMET Length of Visit: 20 minutes    Medications Adjustments/Labs and Tests Ordered: Orders Placed This Encounter  Procedures   Basic metabolic panel   Pt stated he has felt better since starting toprol but BP has remained on the high side. Denies symptoms. Vitals reviewed with Primary cardiologist Dr Tenny Craw. Dr Tenny Craw changed toprol 50 mg daily to toprol 50 mg twice daily and ordered a BMET. Pt stated he would go down stairs to get labs today. Told patient we would follow up with him after results from the lab came back.    Signed, Richelle Ito, RN  02/10/2023 12:17 PM

## 2023-02-11 LAB — BASIC METABOLIC PANEL
BUN/Creatinine Ratio: 21 — ABNORMAL HIGH (ref 9–20)
BUN: 30 mg/dL — ABNORMAL HIGH (ref 6–24)
CO2: 26 mmol/L (ref 20–29)
Calcium: 9.2 mg/dL (ref 8.7–10.2)
Chloride: 104 mmol/L (ref 96–106)
Creatinine, Ser: 1.43 mg/dL — ABNORMAL HIGH (ref 0.76–1.27)
Glucose: 156 mg/dL — ABNORMAL HIGH (ref 70–99)
Potassium: 5.3 mmol/L — ABNORMAL HIGH (ref 3.5–5.2)
Sodium: 141 mmol/L (ref 134–144)
eGFR: 60 mL/min/{1.73_m2} (ref >59)

## 2023-02-13 ENCOUNTER — Telehealth: Payer: Self-pay

## 2023-02-13 NOTE — Telephone Encounter (Signed)
The patient has been notified of the result and verbalized understanding.  All questions (if any) were answered. Frutoso Schatz, RN 02/13/2023 4:55 PM   Patient reports that he ran out of his potassium supplement a couple of weeks ago and has not gotten it refilled. Advised him to remain off of it and avoid high potassium food. Recall has been placed for him to come back at the end of this year.

## 2023-02-13 NOTE — Telephone Encounter (Signed)
-----   Message from [place] sent at 02/13/2023  3:29 PM EST ----- Cr is a relatively stable  Potassium is elevated  5.3   Is he taking KCL supplement?  If so then stop If he is not taking KCL supplement then limit high KCL foods (bananas, oranges/OJ, V8, salt supplements like Ms Sharilyn Sites) Please make appt for me to see him this winter

## 2023-02-16 MED ORDER — METOPROLOL SUCCINATE ER 100 MG PO TB24: 100.0000 mg | ORAL_TABLET | Freq: Every day | ORAL | 3 refills | Status: AC

## 2023-02-24 DIAGNOSIS — I129 Hypertensive chronic kidney disease with stage 1 through stage 4 chronic kidney disease, or unspecified chronic kidney disease: Secondary | ICD-10-CM | POA: Diagnosis not present

## 2023-02-24 DIAGNOSIS — D631 Anemia in chronic kidney disease: Secondary | ICD-10-CM | POA: Diagnosis not present

## 2023-02-24 DIAGNOSIS — N189 Chronic kidney disease, unspecified: Secondary | ICD-10-CM | POA: Diagnosis not present

## 2023-02-24 DIAGNOSIS — N182 Chronic kidney disease, stage 2 (mild): Secondary | ICD-10-CM | POA: Diagnosis not present

## 2023-02-24 DIAGNOSIS — E1122 Type 2 diabetes mellitus with diabetic chronic kidney disease: Secondary | ICD-10-CM | POA: Diagnosis not present

## 2023-02-24 LAB — LAB REPORT - SCANNED: EGFR: 65

## 2023-03-04 DIAGNOSIS — H2511 Age-related nuclear cataract, right eye: Secondary | ICD-10-CM | POA: Diagnosis not present

## 2023-03-04 DIAGNOSIS — I10 Essential (primary) hypertension: Secondary | ICD-10-CM | POA: Diagnosis not present

## 2023-03-04 DIAGNOSIS — H2589 Other age-related cataract: Secondary | ICD-10-CM | POA: Diagnosis not present

## 2023-03-04 DIAGNOSIS — H25811 Combined forms of age-related cataract, right eye: Secondary | ICD-10-CM | POA: Diagnosis not present

## 2023-03-04 DIAGNOSIS — G4733 Obstructive sleep apnea (adult) (pediatric): Secondary | ICD-10-CM | POA: Diagnosis not present

## 2023-03-04 DIAGNOSIS — H21561 Pupillary abnormality, right eye: Secondary | ICD-10-CM | POA: Diagnosis not present

## 2023-03-19 DIAGNOSIS — I129 Hypertensive chronic kidney disease with stage 1 through stage 4 chronic kidney disease, or unspecified chronic kidney disease: Secondary | ICD-10-CM | POA: Diagnosis not present

## 2023-04-01 DIAGNOSIS — H25812 Combined forms of age-related cataract, left eye: Secondary | ICD-10-CM | POA: Diagnosis not present

## 2023-04-01 DIAGNOSIS — H2512 Age-related nuclear cataract, left eye: Secondary | ICD-10-CM | POA: Diagnosis not present

## 2023-04-01 DIAGNOSIS — H25042 Posterior subcapsular polar age-related cataract, left eye: Secondary | ICD-10-CM | POA: Diagnosis not present

## 2023-04-01 DIAGNOSIS — G4733 Obstructive sleep apnea (adult) (pediatric): Secondary | ICD-10-CM | POA: Diagnosis not present

## 2023-05-02 DIAGNOSIS — G4733 Obstructive sleep apnea (adult) (pediatric): Secondary | ICD-10-CM | POA: Diagnosis not present

## 2023-05-04 DIAGNOSIS — H4312 Vitreous hemorrhage, left eye: Secondary | ICD-10-CM | POA: Diagnosis not present

## 2023-05-04 DIAGNOSIS — H35033 Hypertensive retinopathy, bilateral: Secondary | ICD-10-CM | POA: Diagnosis not present

## 2023-05-04 DIAGNOSIS — H43812 Vitreous degeneration, left eye: Secondary | ICD-10-CM | POA: Diagnosis not present

## 2023-05-04 DIAGNOSIS — E113513 Type 2 diabetes mellitus with proliferative diabetic retinopathy with macular edema, bilateral: Secondary | ICD-10-CM | POA: Diagnosis not present

## 2023-05-04 DIAGNOSIS — H35372 Puckering of macula, left eye: Secondary | ICD-10-CM | POA: Diagnosis not present

## 2023-06-01 DIAGNOSIS — G4733 Obstructive sleep apnea (adult) (pediatric): Secondary | ICD-10-CM | POA: Diagnosis not present

## 2023-06-22 DIAGNOSIS — E113513 Type 2 diabetes mellitus with proliferative diabetic retinopathy with macular edema, bilateral: Secondary | ICD-10-CM | POA: Diagnosis not present

## 2023-07-02 DIAGNOSIS — G4733 Obstructive sleep apnea (adult) (pediatric): Secondary | ICD-10-CM | POA: Diagnosis not present

## 2023-07-29 ENCOUNTER — Inpatient Hospital Stay: Payer: BLUE CROSS/BLUE SHIELD

## 2023-08-01 DIAGNOSIS — G4733 Obstructive sleep apnea (adult) (pediatric): Secondary | ICD-10-CM | POA: Diagnosis not present

## 2023-08-05 ENCOUNTER — Inpatient Hospital Stay: Payer: BLUE CROSS/BLUE SHIELD | Admitting: Internal Medicine

## 2023-08-10 DIAGNOSIS — E113513 Type 2 diabetes mellitus with proliferative diabetic retinopathy with macular edema, bilateral: Secondary | ICD-10-CM | POA: Diagnosis not present

## 2023-09-01 DIAGNOSIS — G4733 Obstructive sleep apnea (adult) (pediatric): Secondary | ICD-10-CM | POA: Diagnosis not present

## 2023-09-28 DIAGNOSIS — H35033 Hypertensive retinopathy, bilateral: Secondary | ICD-10-CM | POA: Diagnosis not present

## 2023-09-28 DIAGNOSIS — E113513 Type 2 diabetes mellitus with proliferative diabetic retinopathy with macular edema, bilateral: Secondary | ICD-10-CM | POA: Diagnosis not present

## 2023-09-28 DIAGNOSIS — H35372 Puckering of macula, left eye: Secondary | ICD-10-CM | POA: Diagnosis not present

## 2023-09-28 DIAGNOSIS — H4313 Vitreous hemorrhage, bilateral: Secondary | ICD-10-CM | POA: Diagnosis not present

## 2023-09-28 DIAGNOSIS — H43821 Vitreomacular adhesion, right eye: Secondary | ICD-10-CM | POA: Diagnosis not present

## 2023-11-16 DIAGNOSIS — E113513 Type 2 diabetes mellitus with proliferative diabetic retinopathy with macular edema, bilateral: Secondary | ICD-10-CM | POA: Diagnosis not present

## 2023-11-27 IMAGING — US US EXTREM LOW VENOUS*R*
1 series · 13 of 24 positions shown · non-contrast
Comparison: None.

CLINICAL DATA: Right lower extremity pain and edema for the past 5
days. Evaluate for DVT.



[Series 1: us extrem low venous*right* · 0.10mm/px · 13 of 34 slices shown]
[im 1/34]
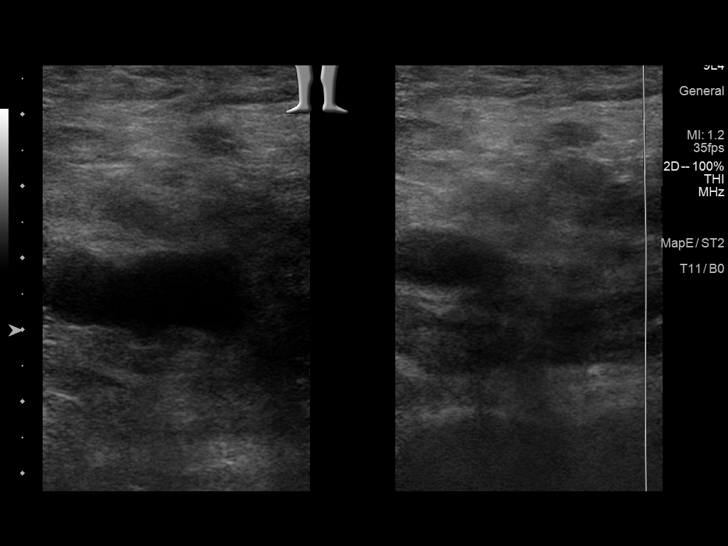
[im 3/34]
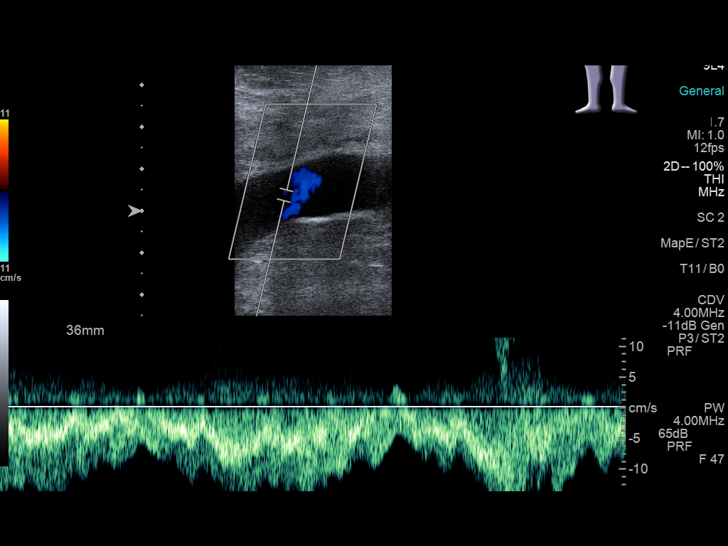
[im 6/34]
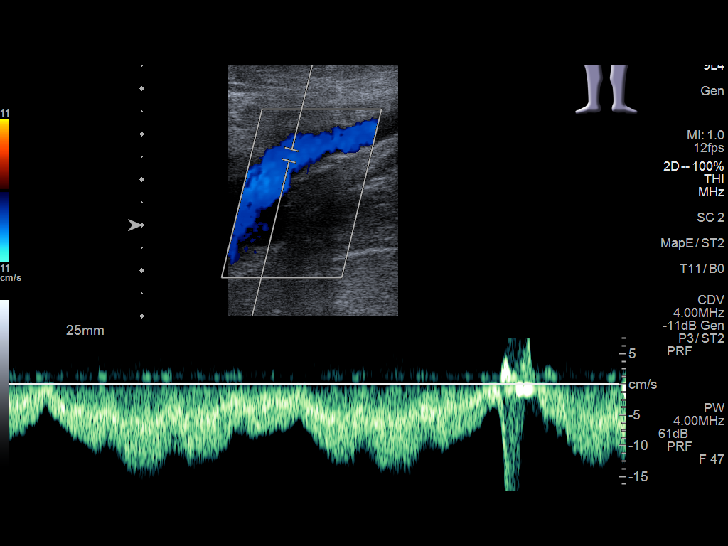
[im 9/34]
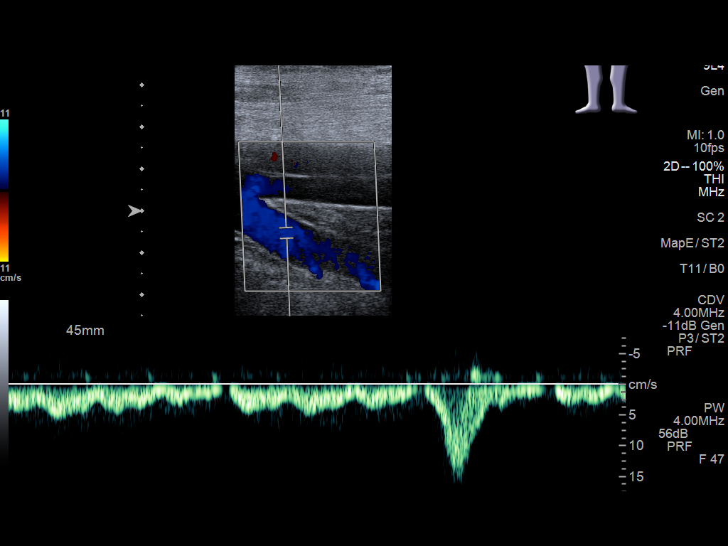
[im 12/34]
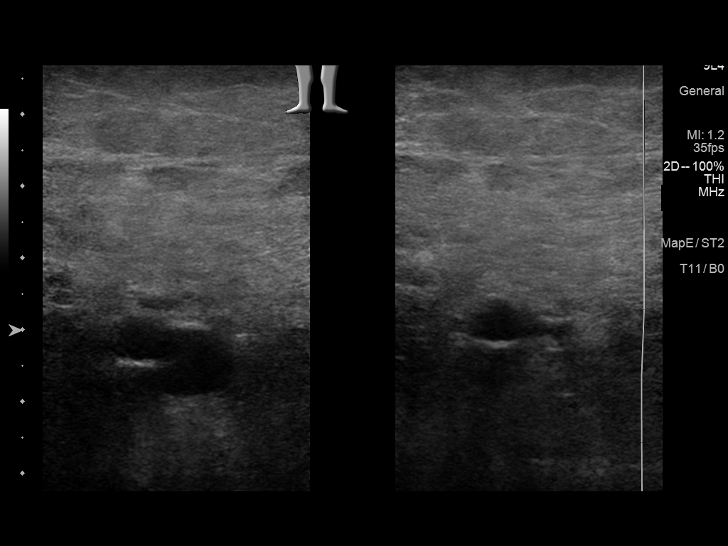
[im 15/34]
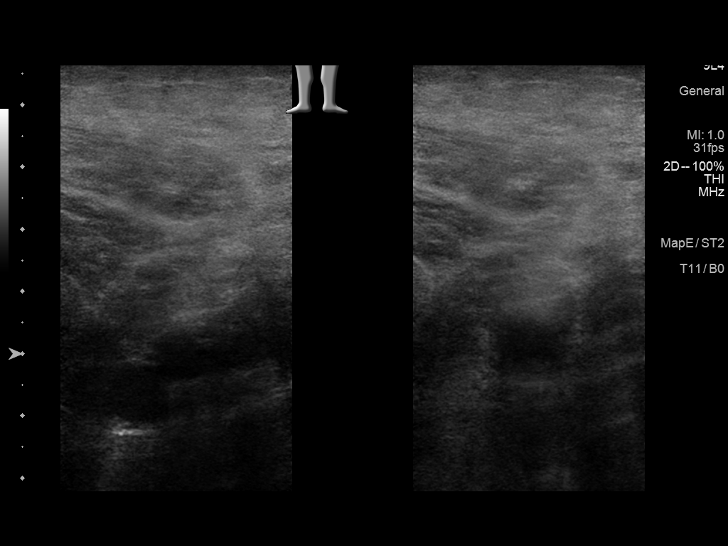
[im 18/34]
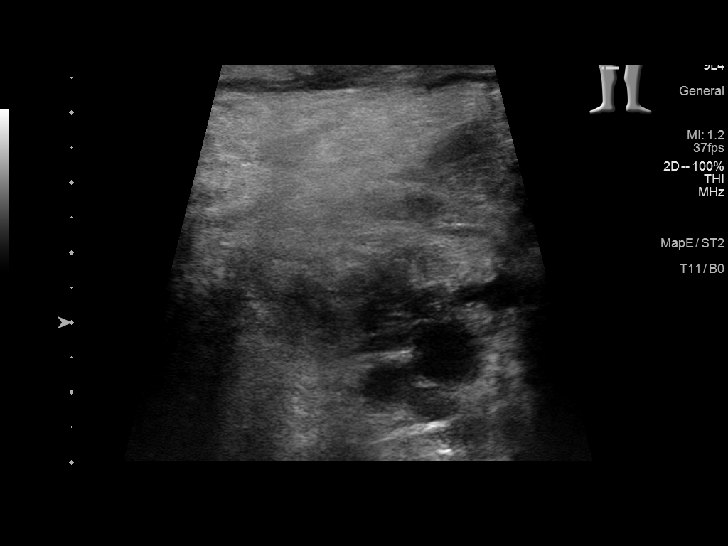
[im 19/34]
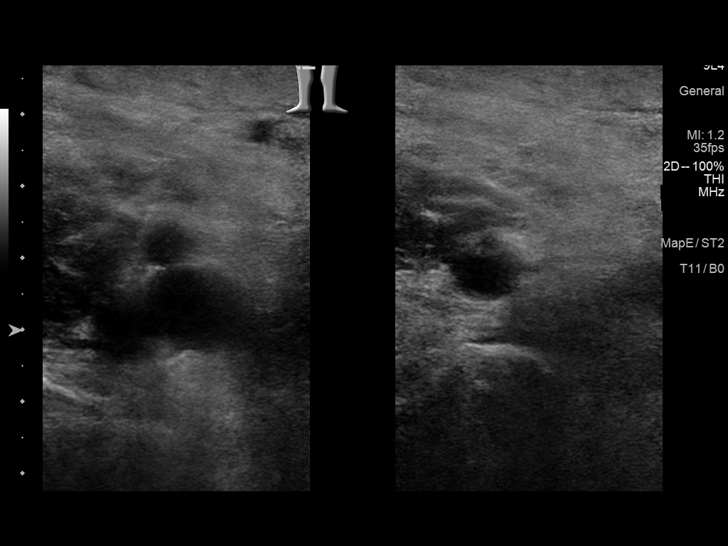
[im 22/34]
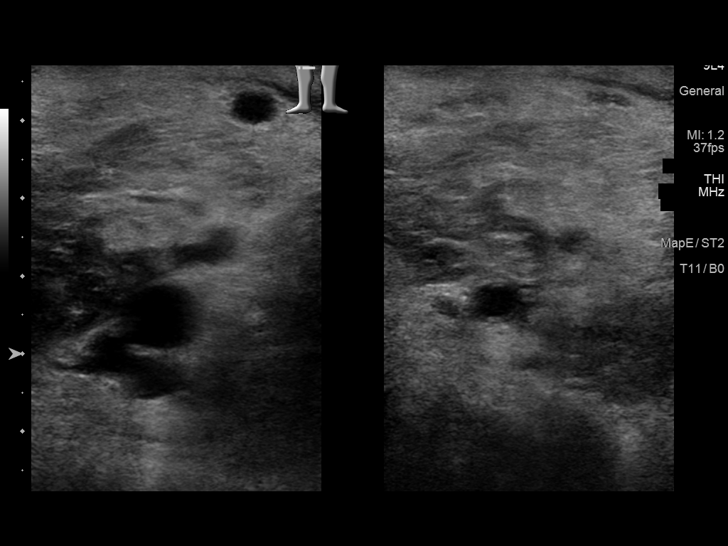
[im 25/34]
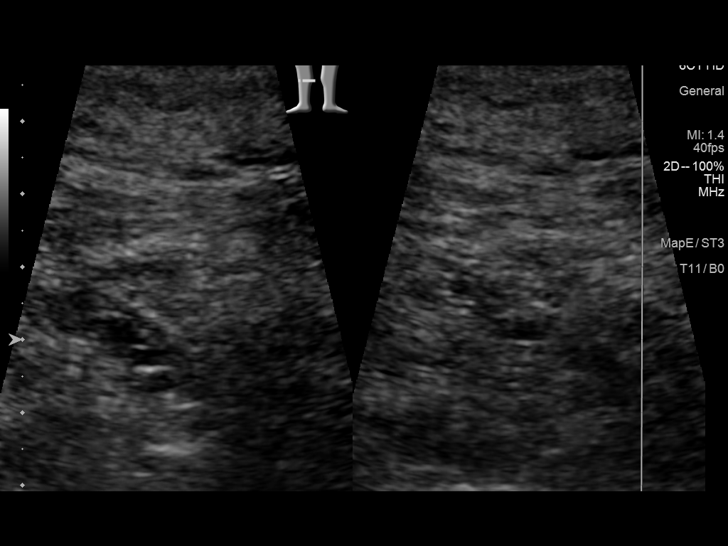
[im 28/34]
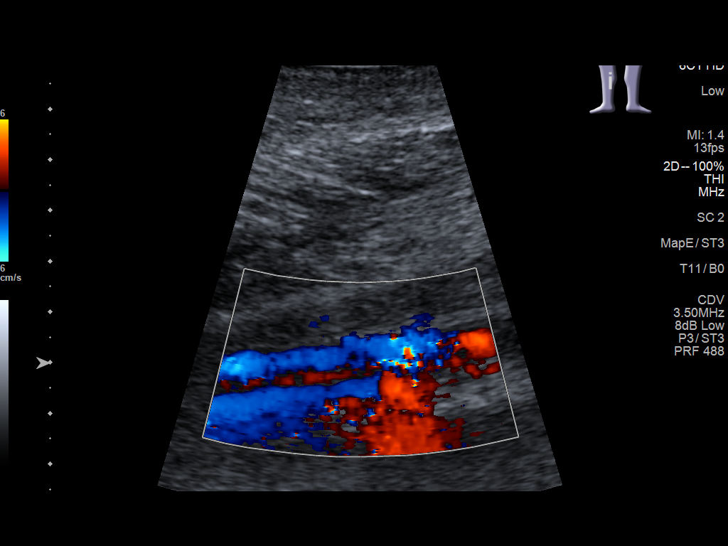
[im 31/34]
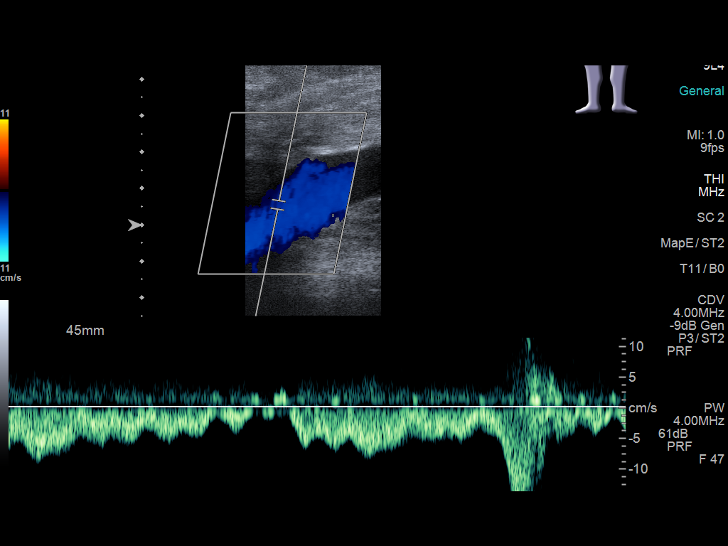
[im 34/34]
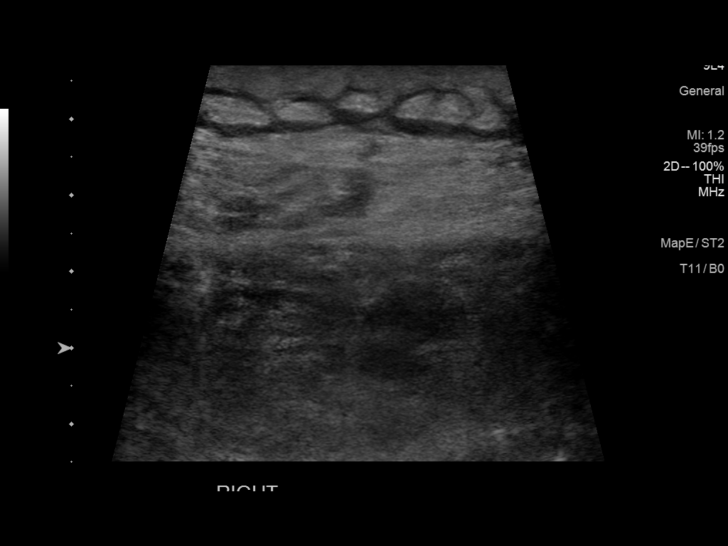

[13 of 24 positions shown; findings below may reference images not displayed]

FINDINGS: Contralateral Common Femoral Vein: Respiratory phasicity is normal
and symmetric with the symptomatic side. No evidence of thrombus.
Normal compressibility.

Common Femoral Vein: No evidence of thrombus. Normal
compressibility, respiratory phasicity and response to augmentation.

Saphenofemoral Junction: No evidence of thrombus. Normal
compressibility and flow on color Doppler imaging.

Profunda Femoral Vein: No evidence of thrombus. Normal
compressibility and flow on color Doppler imaging.

Femoral Vein: No evidence of thrombus. Normal compressibility,
respiratory phasicity and response to augmentation.

Popliteal Vein: No evidence of thrombus. Normal compressibility,
respiratory phasicity and response to augmentation.

Calf Veins: No evidence of thrombus. Normal compressibility and flow
on color Doppler imaging.

Superficial Great Saphenous Vein: No evidence of thrombus. Normal
compressibility.

Venous Reflux:  None.

Other Findings: There is a moderate amount of subcutaneous edema at
the level of the posterior calf (images 34 through 35).
IMPRESSION: No evidence of DVT within the right lower extremity.

## 2024-01-04 DIAGNOSIS — E113513 Type 2 diabetes mellitus with proliferative diabetic retinopathy with macular edema, bilateral: Secondary | ICD-10-CM | POA: Diagnosis not present
